# Patient Record
Sex: Female | Born: 1935
Health system: Southern US, Community
[De-identification: ages and names within clinical notes are randomized; demographics above are authoritative.]

## PROBLEM LIST (undated history)

## (undated) DIAGNOSIS — L570 Actinic keratosis: Secondary | ICD-10-CM

## (undated) DIAGNOSIS — M810 Age-related osteoporosis without current pathological fracture: Secondary | ICD-10-CM

## (undated) DIAGNOSIS — K5790 Diverticulosis of intestine, part unspecified, without perforation or abscess without bleeding: Secondary | ICD-10-CM

## (undated) DIAGNOSIS — K529 Noninfective gastroenteritis and colitis, unspecified: Secondary | ICD-10-CM

## (undated) DIAGNOSIS — K8020 Calculus of gallbladder without cholecystitis without obstruction: Secondary | ICD-10-CM

## (undated) DIAGNOSIS — K635 Polyp of colon: Secondary | ICD-10-CM

## (undated) DIAGNOSIS — M6740 Ganglion, unspecified site: Secondary | ICD-10-CM

## (undated) DIAGNOSIS — I10 Essential (primary) hypertension: Secondary | ICD-10-CM

## (undated) DIAGNOSIS — K219 Gastro-esophageal reflux disease without esophagitis: Secondary | ICD-10-CM

## (undated) HISTORY — PX: COLONOSCOPY: SHX174

## (undated) HISTORY — DX: Polyp of colon: K63.5

## (undated) HISTORY — DX: Noninfective gastroenteritis and colitis, unspecified: K52.9

## (undated) HISTORY — PX: RECTAL SURGERY: SHX760

## (undated) HISTORY — PX: APPENDECTOMY: SHX54

## (undated) HISTORY — DX: Gastro-esophageal reflux disease without esophagitis: K21.9

## (undated) HISTORY — DX: Actinic keratosis: L57.0

## (undated) HISTORY — PX: CHOLECYSTECTOMY: SHX55

## (undated) HISTORY — DX: Diverticulosis of intestine, part unspecified, without perforation or abscess without bleeding: K57.90

## (undated) HISTORY — PX: TONSILLECTOMY: SUR1361

## (undated) HISTORY — PX: COLON SURGERY: SHX602

## (undated) HISTORY — DX: Calculus of gallbladder without cholecystitis without obstruction: K80.20

## (undated) SURGERY — Surgical Case
Anesthesia: *Unknown

---

## 2012-06-03 ENCOUNTER — Encounter (INDEPENDENT_AMBULATORY_CARE_PROVIDER_SITE_OTHER): Payer: Self-pay | Admitting: *Deleted

## 2012-06-24 ENCOUNTER — Ambulatory Visit (INDEPENDENT_AMBULATORY_CARE_PROVIDER_SITE_OTHER): Payer: Self-pay | Admitting: Internal Medicine

## 2013-04-24 DIAGNOSIS — R55 Syncope and collapse: Secondary | ICD-10-CM

## 2013-04-24 DIAGNOSIS — R42 Dizziness and giddiness: Secondary | ICD-10-CM

## 2015-02-28 ENCOUNTER — Encounter (INDEPENDENT_AMBULATORY_CARE_PROVIDER_SITE_OTHER): Payer: Self-pay | Admitting: *Deleted

## 2015-02-28 ENCOUNTER — Encounter (INDEPENDENT_AMBULATORY_CARE_PROVIDER_SITE_OTHER): Payer: Self-pay

## 2015-03-07 ENCOUNTER — Other Ambulatory Visit (INDEPENDENT_AMBULATORY_CARE_PROVIDER_SITE_OTHER): Payer: Self-pay | Admitting: *Deleted

## 2015-03-07 DIAGNOSIS — Z86010 Personal history of colonic polyps: Secondary | ICD-10-CM

## 2015-03-07 DIAGNOSIS — Z800 Family history of malignant neoplasm of digestive organs: Secondary | ICD-10-CM

## 2015-04-11 ENCOUNTER — Telehealth (INDEPENDENT_AMBULATORY_CARE_PROVIDER_SITE_OTHER): Payer: Self-pay | Admitting: *Deleted

## 2015-04-11 MED ORDER — PEG 3350-KCL-NA BICARB-NACL 420 G PO SOLR: 4000.0000 mL | Freq: Once | ORAL | Status: DC

## 2015-04-11 NOTE — Telephone Encounter (Signed)
Patient needs trilyte 

## 2015-05-01 ENCOUNTER — Telehealth (INDEPENDENT_AMBULATORY_CARE_PROVIDER_SITE_OTHER): Payer: Self-pay | Admitting: *Deleted

## 2015-05-01 NOTE — Telephone Encounter (Signed)
Referring MD/PCP: vyas   Procedure: tcs  Reason/Indication:  Hx polyps, fam hx colon ca  Has patient had this procedure before?  Yes, 2011 -- scanned  If so, when, by whom and where?    Is there a family history of colon cancer?  Yes, brother  Who?  What age when diagnosed?    Is patient diabetic?   no      Does patient have prosthetic heart valve?  no  Do you have a pacemaker?  no  Has patient ever had endocarditis? no  Has patient had joint replacement within last 12 months?  no  Does patient tend to be constipated or take laxatives? no  Is patient on Coumadin, Plavix and/or Aspirin? no  Medications: spironolactone 25 mg daily, bystolic 10 mg daily, lisinopril 20 mg daily, evista 60 mg daily, potassium daily, cod liver oil daily  Allergies: sulfur  Medication Adjustment:   Procedure date & time: 05/18/15 at 1030

## 2015-05-03 NOTE — Telephone Encounter (Signed)
agree

## 2015-05-18 ENCOUNTER — Ambulatory Visit (HOSPITAL_COMMUNITY)
Admission: RE | Admit: 2015-05-18 | Discharge: 2015-05-18 | Disposition: A | Discharge status: Home / Self Care | Payer: Medicare Other | Source: Ambulatory Visit | Attending: Internal Medicine | Admitting: Internal Medicine

## 2015-05-18 ENCOUNTER — Encounter (HOSPITAL_COMMUNITY): Payer: Self-pay | Admitting: *Deleted

## 2015-05-18 ENCOUNTER — Encounter (HOSPITAL_COMMUNITY)
Admission: RE | Disposition: A | Discharge status: Home / Self Care | Payer: Self-pay | Source: Ambulatory Visit | Attending: Internal Medicine

## 2015-05-18 DIAGNOSIS — Z9049 Acquired absence of other specified parts of digestive tract: Secondary | ICD-10-CM | POA: Diagnosis not present

## 2015-05-18 DIAGNOSIS — I10 Essential (primary) hypertension: Secondary | ICD-10-CM | POA: Diagnosis not present

## 2015-05-18 DIAGNOSIS — D128 Benign neoplasm of rectum: Secondary | ICD-10-CM | POA: Insufficient documentation

## 2015-05-18 DIAGNOSIS — Z79899 Other long term (current) drug therapy: Secondary | ICD-10-CM | POA: Diagnosis not present

## 2015-05-18 DIAGNOSIS — Z1211 Encounter for screening for malignant neoplasm of colon: Secondary | ICD-10-CM | POA: Diagnosis present

## 2015-05-18 DIAGNOSIS — K648 Other hemorrhoids: Secondary | ICD-10-CM | POA: Diagnosis not present

## 2015-05-18 DIAGNOSIS — Z86010 Personal history of colonic polyps: Secondary | ICD-10-CM

## 2015-05-18 DIAGNOSIS — K649 Unspecified hemorrhoids: Secondary | ICD-10-CM | POA: Diagnosis not present

## 2015-05-18 DIAGNOSIS — K5730 Diverticulosis of large intestine without perforation or abscess without bleeding: Secondary | ICD-10-CM | POA: Diagnosis not present

## 2015-05-18 DIAGNOSIS — Z800 Family history of malignant neoplasm of digestive organs: Secondary | ICD-10-CM

## 2015-05-18 HISTORY — DX: Essential (primary) hypertension: I10

## 2015-05-18 HISTORY — PX: COLONOSCOPY: SHX5424

## 2015-05-18 SURGERY — COLONOSCOPY
Anesthesia: Moderate Sedation

## 2015-05-18 MED ORDER — MIDAZOLAM HCL 5 MG/5ML IJ SOLN
INTRAMUSCULAR | Status: AC
  Filled 2015-05-18: qty 10

## 2015-05-18 MED ORDER — STERILE WATER FOR IRRIGATION IR SOLN
Status: DC | PRN
  Administered 2015-05-18: 11:00:00

## 2015-05-18 MED ORDER — SODIUM CHLORIDE 0.9 % IV SOLN
INTRAVENOUS | Status: DC
  Administered 2015-05-18: 10:00:00 via INTRAVENOUS

## 2015-05-18 MED ORDER — MEPERIDINE HCL 50 MG/ML IJ SOLN
INTRAMUSCULAR | Status: AC
  Filled 2015-05-18: qty 1

## 2015-05-18 MED ORDER — MIDAZOLAM HCL 5 MG/5ML IJ SOLN
INTRAMUSCULAR | Status: DC | PRN
  Administered 2015-05-18 (×2): 1 mg via INTRAVENOUS
  Administered 2015-05-18: 2 mg via INTRAVENOUS

## 2015-05-18 MED ORDER — MEPERIDINE HCL 50 MG/ML IJ SOLN
INTRAMUSCULAR | Status: DC | PRN
  Administered 2015-05-18 (×2): 25 mg via INTRAVENOUS

## 2015-05-18 NOTE — Discharge Instructions (Signed)
Resume usual medications and high fiber diet. Imodium OTC 2 mg by mouth daily as needed. No driving for 24 hours. Physician will call with biopsy results. Next colonoscopy in 5 years.      Colonoscopy, Care After These instructions give you information on caring for yourself after your procedure. Your doctor may also give you more specific instructions. Call your doctor if you have any problems or questions after your procedure. HOME CARE  Do not drive for 24 hours.  Do not sign important papers or use machinery for 24 hours.  You may shower.  You may go back to your usual activities, but go slower for the first 24 hours.  Take rest breaks often during the first 24 hours.  Walk around or use warm packs on your belly (abdomen) if you have belly cramping or gas.  Drink enough fluids to keep your pee (urine) clear or pale yellow.  Resume your normal diet. Avoid heavy or fried foods.  Avoid drinking alcohol for 24 hours or as told by your doctor.  Only take medicines as told by your doctor. If a tissue sample (biopsy) was taken during the procedure:   Do not take aspirin or blood thinners for 7 days, or as told by your doctor.  Do not drink alcohol for 7 days, or as told by your doctor.  Eat soft foods for the first 24 hours. GET HELP IF: You still have a small amount of blood in your poop (stool) 2-3 days after the procedure. GET HELP RIGHT AWAY IF:  You have more than a small amount of blood in your poop.  You see clumps of tissue (blood clots) in your poop.  Your belly is puffy (swollen).  You feel sick to your stomach (nauseous) or throw up (vomit).  You have a fever.  You have belly pain that gets worse and medicine does not help. MAKE SURE YOU:  Understand these instructions.  Will watch your condition.  Will get help right away if you are not doing well or get worse. Document Released: 11/02/2010 Document Revised: 10/05/2013 Document Reviewed:  06/07/2013 [facility] Patient Information 2015 [place], [place]. This information is not intended to replace advice given to you by your health care provider. Make sure you discuss any questions you have with your health care provider.     High-Fiber Diet Fiber is found in fruits, vegetables, and grains. A high-fiber diet encourages the addition of more whole grains, legumes, fruits, and vegetables in your diet. The recommended amount of fiber for adult males is 38 g per day. For adult females, it is 25 g per day. Pregnant and lactating women should get 28 g of fiber per day. If you have a digestive or bowel problem, ask your caregiver for advice before adding high-fiber foods to your diet. Eat a variety of high-fiber foods instead of only a select few type of foods.  PURPOSE  To increase stool bulk.  To make bowel movements more regular to prevent constipation.  To lower cholesterol.  To prevent overeating. WHEN IS THIS DIET USED?  It may be used if you have constipation and hemorrhoids.  It may be used if you have uncomplicated diverticulosis (intestine condition) and irritable bowel syndrome.  It may be used if you need help with weight management.  It may be used if you want to add it to your diet as a protective measure against atherosclerosis, diabetes, and cancer. SOURCES OF FIBER  Whole-grain breads and cereals.  Fruits, such  as apples, oranges, bananas, berries, prunes, and pears.  Vegetables, such as green peas, carrots, sweet potatoes, beets, broccoli, cabbage, spinach, and artichokes.  Legumes, such split peas, soy, lentils.  Almonds. FIBER CONTENT IN FOODS Starches and Grains / Dietary Fiber (g)  Cheerios, 1 cup / 3 g  Corn Flakes cereal, 1 cup / 0.7 g  Rice crispy treat cereal, 1 cup / 0.3 g  Instant oatmeal (cooked),  cup / 2 g  Frosted wheat cereal, 1 cup / 5.1 g  Brown, long-grain rice (cooked), 1 cup / 3.5 g  White, long-grain rice (cooked), 1 cup  / 0.6 g  Enriched macaroni (cooked), 1 cup / 2.5 g Legumes / Dietary Fiber (g)  Baked beans (canned, plain, or vegetarian),  cup / 5.2 g  Kidney beans (canned),  cup / 6.8 g  Pinto beans (cooked),  cup / 5.5 g Breads and Crackers / Dietary Fiber (g)  Plain or honey graham crackers, 2 squares / 0.7 g  Saltine crackers, 3 squares / 0.3 g  Plain, salted pretzels, 10 pieces / 1.8 g  Whole-wheat bread, 1 slice / 1.9 g  White bread, 1 slice / 0.7 g  Raisin bread, 1 slice / 1.2 g  Plain bagel, 3 oz / 2 g  Flour tortilla, 1 oz / 0.9 g  Corn tortilla, 1 small / 1.5 g  Hamburger or hotdog bun, 1 small / 0.9 g Fruits / Dietary Fiber (g)  Apple with skin, 1 medium / 4.4 g  Sweetened applesauce,  cup / 1.5 g  Banana,  medium / 1.5 g  Grapes, 10 grapes / 0.4 g  Orange, 1 small / 2.3 g  Raisin, 1.5 oz / 1.6 g  Melon, 1 cup / 1.4 g Vegetables / Dietary Fiber (g)  Green beans (canned),  cup / 1.3 g  Carrots (cooked),  cup / 2.3 g  Broccoli (cooked),  cup / 2.8 g  Peas (cooked),  cup / 4.4 g  Mashed potatoes,  cup / 1.6 g  Lettuce, 1 cup / 0.5 g  Corn (canned),  cup / 1.6 g  Tomato,  cup / 1.1 g Document Released: 09/30/2005 Document Revised: 03/31/2012 Document Reviewed: 01/02/2012 [employer] Patient Information 2015 [place], [place]. This information is not intended to replace advice given to you by your health care provider. Make sure you discuss any questions you have with your health care provider.    Colon Polyps Polyps are lumps of extra tissue growing inside the body. Polyps can grow in the large intestine (colon). Most colon polyps are noncancerous (benign). However, some colon polyps can become cancerous over time. Polyps that are larger than a pea may be harmful. To be safe, caregivers remove and test all polyps. CAUSES  Polyps form when mutations in the genes cause your cells to grow and divide even though no more tissue is needed. RISK  FACTORS There are a number of risk factors that can increase your chances of getting colon polyps. They include:  Being older than 50 years.  Family history of colon polyps or colon cancer.  Long-term colon diseases, such as colitis or Crohn disease.  Being overweight.  Smoking.  Being inactive.  Drinking too much alcohol. SYMPTOMS  Most small polyps do not cause symptoms. If symptoms are present, they may include:  Blood in the stool. The stool may look dark red or black.  Constipation or diarrhea that lasts longer than 1 week. DIAGNOSIS People often do not know they have  polyps until their caregiver finds them during a regular checkup. Your caregiver can use 4 tests to check for polyps:  Digital rectal exam. The caregiver wears gloves and feels inside the rectum. This test would find polyps only in the rectum.  Barium enema. The caregiver puts a liquid called barium into your rectum before taking X-rays of your colon. Barium makes your colon look white. Polyps are dark, so they are easy to see in the X-ray pictures.  Sigmoidoscopy. A thin, flexible tube (sigmoidoscope) is placed into your rectum. The sigmoidoscope has a light and tiny camera in it. The caregiver uses the sigmoidoscope to look at the last third of your colon.  Colonoscopy. This test is like sigmoidoscopy, but the caregiver looks at the entire colon. This is the most common method for finding and removing polyps. TREATMENT  Any polyps will be removed during a sigmoidoscopy or colonoscopy. The polyps are then tested for cancer. PREVENTION  To help lower your risk of getting more colon polyps:  Eat plenty of fruits and vegetables. Avoid eating fatty foods.  Do not smoke.  Avoid drinking alcohol.  Exercise every day.  Lose weight if recommended by your caregiver.  Eat plenty of calcium and folate. Foods that are rich in calcium include milk, cheese, and broccoli. Foods that are rich in folate include  chickpeas, kidney beans, and spinach. HOME CARE INSTRUCTIONS Keep all follow-up appointments as directed by your caregiver. You may need periodic exams to check for polyps. SEEK MEDICAL CARE IF: You notice bleeding during a bowel movement. Document Released: 06/26/2004 Document Revised: 12/23/2011 Document Reviewed: 12/10/2011 [facility] Patient Information 2015 [place], [place]. This information is not intended to replace advice given to you by your health care provider. Make sure you discuss any questions you have with your health care provider.     Diverticulosis Diverticulosis is the condition that develops when small pouches (diverticula) form in the wall of your colon. Your colon, or large intestine, is where water is absorbed and stool is formed. The pouches form when the inside layer of your colon pushes through weak spots in the outer layers of your colon. CAUSES  No one knows exactly what causes diverticulosis. RISK FACTORS  Being older than 15. Your risk for this condition increases with age. Diverticulosis is rare in people younger than 40 years. By age 21, almost everyone has it.  Eating a low-fiber diet.  Being frequently constipated.  Being overweight.  Not getting enough exercise.  Smoking.  Taking over-the-counter pain medicines, like aspirin and ibuprofen. SYMPTOMS  Most people with diverticulosis do not have symptoms. DIAGNOSIS  Because diverticulosis often has no symptoms, health care providers often discover the condition during an exam for other colon problems. In many cases, a health care provider will diagnose diverticulosis while using a flexible scope to examine the colon (colonoscopy). TREATMENT  If you have never developed an infection related to diverticulosis, you may not need treatment. If you have had an infection before, treatment may include:  Eating more fruits, vegetables, and grains.  Taking a fiber supplement.  Taking a live bacteria  supplement (probiotic).  Taking medicine to relax your colon. HOME CARE INSTRUCTIONS   Drink at least 6-8 glasses of water each day to prevent constipation.  Try not to strain when you have a bowel movement.  Keep all follow-up appointments. If you have had an infection before:  Increase the fiber in your diet as directed by your health care provider or dietitian.  Take  a dietary fiber supplement if your health care provider approves.  Only take medicines as directed by your health care provider. SEEK MEDICAL CARE IF:   You have abdominal pain.  You have bloating.  You have cramps.  You have not gone to the bathroom in 3 days. SEEK IMMEDIATE MEDICAL CARE IF:   Your pain gets worse.  Yourbloating becomes very bad.  You have a fever or chills, and your symptoms suddenly get worse.  You begin vomiting.  You have bowel movements that are bloody or black. MAKE SURE YOU:  Understand these instructions.  Will watch your condition.  Will get help right away if you are not doing well or get worse. Document Released: 06/27/2004 Document Revised: 10/05/2013 Document Reviewed: 08/25/2013 [facility] Patient Information 2015 [place], [place]. This information is not intended to replace advice given to you by your health care provider. Make sure you discuss any questions you have with your health care provider.   Hemorrhoids Hemorrhoids are swollen veins around the rectum or anus. There are two types of hemorrhoids:   Internal hemorrhoids. These occur in the veins just inside the rectum. They may poke through to the outside and become irritated and painful.  External hemorrhoids. These occur in the veins outside the anus and can be felt as a painful swelling or hard lump near the anus. CAUSES  Pregnancy.   Obesity.   Constipation or diarrhea.   Straining to have a bowel movement.   Sitting for long periods on the toilet.  Heavy lifting or other activity that  caused you to strain.  Anal intercourse. SYMPTOMS   Pain.   Anal itching or irritation.   Rectal bleeding.   Fecal leakage.   Anal swelling.   One or more lumps around the anus.  DIAGNOSIS  Your caregiver may be able to diagnose hemorrhoids by visual examination. Other examinations or tests that may be performed include:   Examination of the rectal area with a gloved hand (digital rectal exam).   Examination of anal canal using a small tube (scope).   A blood test if you have lost a significant amount of blood.  A test to look inside the colon (sigmoidoscopy or colonoscopy). TREATMENT Most hemorrhoids can be treated at home. However, if symptoms do not seem to be getting better or if you have a lot of rectal bleeding, your caregiver may perform a procedure to help make the hemorrhoids get smaller or remove them completely. Possible treatments include:   Placing a rubber band at the base of the hemorrhoid to cut off the circulation (rubber band ligation).   Injecting a chemical to shrink the hemorrhoid (sclerotherapy).   Using a tool to burn the hemorrhoid (infrared light therapy).   Surgically removing the hemorrhoid (hemorrhoidectomy).   Stapling the hemorrhoid to block blood flow to the tissue (hemorrhoid stapling).  HOME CARE INSTRUCTIONS   Eat foods with fiber, such as whole grains, beans, nuts, fruits, and vegetables. Ask your doctor about taking products with added fiber in them (fibersupplements).  Increase fluid intake. Drink enough water and fluids to keep your urine clear or pale yellow.   Exercise regularly.   Go to the bathroom when you have the urge to have a bowel movement. Do not wait.   Avoid straining to have bowel movements.   Keep the anal area dry and clean. Use wet toilet paper or moist towelettes after a bowel movement.   Medicated creams and suppositories may be used or applied as  directed.   Only take over-the-counter  or prescription medicines as directed by your caregiver.   Take warm sitz baths for 15-20 minutes, 3-4 times a day to ease pain and discomfort.   Place ice packs on the hemorrhoids if they are tender and swollen. Using ice packs between sitz baths may be helpful.   Put ice in a plastic bag.   Place a towel between your skin and the bag.   Leave the ice on for 15-20 minutes, 3-4 times a day.   Do not use a donut-shaped pillow or sit on the toilet for long periods. This increases blood pooling and pain.  SEEK MEDICAL CARE IF:  You have increasing pain and swelling that is not controlled by treatment or medicine.  You have uncontrolled bleeding.  You have difficulty or you are unable to have a bowel movement.  You have pain or inflammation outside the area of the hemorrhoids. MAKE SURE YOU:  Understand these instructions.  Will watch your condition.  Will get help right away if you are not doing well or get worse. Document Released: 09/27/2000 Document Revised: 09/16/2012 Document Reviewed: 08/04/2012 [facility] Patient Information 2015 [place], [place]. This information is not intended to replace advice given to you by your health care provider. Make sure you discuss any questions you have with your health care provider.

## 2015-05-18 NOTE — Op Note (Signed)
COLONOSCOPY PROCEDURE REPORT  PATIENT:  Morgan Sandoval  MR#:  [id number] Birthdate:  1935-02-22, 80 y.o., female Endoscopist:  Dr. Rogene Houston, MD Referred By:  Dr. [name], MD Procedure Date: 05/18/2015  Procedure:   Colonoscopy  Indications: Patient is 80 year old Caucasian female was history of colonic polyps and family history of CRC in brother. She had right hemicolectomy for large cecal tubulovillous adenoma in 2000 and she has failed resection of large rectal polyp in 2002. She had 2 small rectal polyps removed 5 years ago and these were sessile serrated polyps. She has intermittent diarrhea since her right hemicolectomy.  Informed Consent:  The procedure and risks were reviewed with the patient and informed consent was obtained.  Medications:  Demerol 50 mg IV Versed  4 mg IV  Description of procedure:  After a digital rectal exam was performed, that colonoscope was advanced from the anus through the rectum and colon to the area of hepatic flexure ileocolonic anastomosis was identified.  These structures were well-seen and photographed for the record.  Scope was slowly and cautiously withdrawn. The mucosal surfaces were carefully surveyed utilizing scope tip to flexion to facilitate fold flattening as needed. The scope was pulled down into the rectum where a thorough exam including retroflexion was performed.  Findings:   Prep satisfactory. Normal mucosa of distal small bowel. Wide-open ileocolonic anastomosis. Multiple diverticula at sigmoid colon. Small rectal polyp ablated via cold biopsy. Distal rectal scar from prior polypectomy. Small hemorrhoids below the dentate line.   Therapeutic/Diagnostic Maneuvers Performed:  See above  Complications:  None  Colon Withdrawal Time:  9 minutes  Impression:  Examination performed to ileocolonic anastomosis. Multiple diverticula at sigmoid colon. Small rectal polyp ablated via cold biopsy. Distal rectal scar from  previous polypectomy. Small external hemorrhoids   Recommendations:  Standard instructions given. High fiber diet. Imodium OTC 2 mg by mouth daily when necessary.` I will contact patient with biopsy results and further recommendations.  [name] U  05/18/2015 11:00 AM  CC: Dr. [name]., MD & Dr. Rayne Du ref. provider found

## 2015-05-18 NOTE — H&P (Signed)
Morgan Sandoval is an 80 y.o. female.   Chief Complaint: Patient is here for colonoscopy. HPI: Patient is 80 year old Caucasian female was history of colonic polyps and is here for surveillance colonoscopy. She denies rectal bleeding. She says she's had diarrhea since she had right hemicolectomy for large cecal tubulovillous adenoma in 2000. She believes psyllium has helped. She has anywhere from 1-4 stools per day. She denies weight loss. She underwent transanal resection of large adenoma at [facility] in 2002. Last colonoscopy was about 5 years ago with removal of 2 small rectal polyps and these is sessile serrated polyps. Family history significant for CRC in a brother who was diagnosed with rectal carcinoma 2 years ago treated with chemoradiation followed by surgery and is doing fine.  Past Medical History  Diagnosis Date  . Hypertension     Past Surgical History  Procedure Laterality Date  . Appendectomy    . Tonsillectomy    . Colon surgery      Removed large polyps  . Colonoscopy    . Cholecystectomy      Family History  Problem Relation Age of Onset  . Cancer - Colon Brother    Social History:  reports that she has never smoked. She does not have any smokeless tobacco history on file. She reports that she drinks alcohol. She reports that she does not use illicit drugs.  Allergies:  Allergies  Allergen Reactions  . Sulfa Antibiotics Rash    Medications Prior to Admission  Medication Sig Dispense Refill  . lisinopril (PRINIVIL,ZESTRIL) 5 MG tablet Take 5 mg by mouth daily.    . Multiple Vitamin (MULTIVITAMIN WITH MINERALS) TABS tablet Take 1 tablet by mouth daily.    . nebivolol (BYSTOLIC) 10 MG tablet Take 10 mg by mouth daily.    . polyethylene glycol-electrolytes (NULYTELY/GOLYTELY) 420 G solution Take 4,000 mLs by mouth once. 4000 mL 0  . raloxifene (EVISTA) 60 MG tablet Take 60 mg by mouth daily.    Marland Kitchen spironolactone (ALDACTONE) 25 MG tablet Take 25 mg by mouth daily.       No results found for this or any previous visit (from the past 48 hour(s)). No results found.  ROS  Blood pressure 150/71, pulse 58, temperature 98 F (36.7 C), temperature source Oral, resp. rate 18, SpO2 98 %. Physical Exam  Constitutional: She appears well-developed and well-nourished.  HENT:  Mouth/Throat: Oropharynx is clear and moist.  Eyes: Conjunctivae are normal. No scleral icterus.  Neck: No thyromegaly present.  Cardiovascular: Normal rate, regular rhythm and normal heart sounds.   No murmur heard. Respiratory: Effort normal and breath sounds normal.  GI: Soft. She exhibits no distension and no mass. There is no tenderness.  Musculoskeletal: She exhibits no edema.  Lymphadenopathy:    She has no cervical adenopathy.  Neurological: She is alert.  Skin: Skin is warm and dry.     Assessment/Plan History of colonic polyps and family history of CRC in brother. Surveillance colonoscopy.  Morgan Sandoval U 05/18/2015, 10:27 AM

## 2015-05-22 ENCOUNTER — Encounter (HOSPITAL_COMMUNITY): Payer: Self-pay | Admitting: Internal Medicine

## 2015-05-23 ENCOUNTER — Encounter (INDEPENDENT_AMBULATORY_CARE_PROVIDER_SITE_OTHER): Payer: Self-pay | Admitting: *Deleted

## 2016-10-10 ENCOUNTER — Encounter (INDEPENDENT_AMBULATORY_CARE_PROVIDER_SITE_OTHER): Payer: Self-pay

## 2016-10-10 ENCOUNTER — Encounter (INDEPENDENT_AMBULATORY_CARE_PROVIDER_SITE_OTHER): Payer: Self-pay | Admitting: Internal Medicine

## 2016-10-29 ENCOUNTER — Encounter (INDEPENDENT_AMBULATORY_CARE_PROVIDER_SITE_OTHER): Payer: Self-pay | Admitting: Internal Medicine

## 2016-10-29 ENCOUNTER — Ambulatory Visit (INDEPENDENT_AMBULATORY_CARE_PROVIDER_SITE_OTHER): Payer: Medicare Other | Admitting: Internal Medicine

## 2016-10-29 VITALS — BP 114/68 | HR 64 | Temp 97.4°F | Resp 18 | Ht 65.0 in | Wt 127.4 lb

## 2016-10-29 DIAGNOSIS — Z86010 Personal history of colonic polyps: Secondary | ICD-10-CM

## 2016-10-29 DIAGNOSIS — R195 Other fecal abnormalities: Secondary | ICD-10-CM | POA: Diagnosis not present

## 2016-10-29 NOTE — Progress Notes (Signed)
Presenting complaint;  Heme positive stool.  History of present illness:  Patient is 82 year old Caucasian female who is referred through [facility] of Dr. Woody Seller because she was noted to have heme positive stool. She recalls that she was having diarrhea when this test was done. She denies melena or rectal bleeding. She also denies abdominal pain. She recalls since her right hand Legrand Como ectomy in 2000 she has had irregular bowel movements and prone to have diarrhea. She has anywhere from 0-3 bowel movements per day. She has very good appetite. She states she lost few pounds recently while she was an antibiotic for URI. She does not take OTC NSAIDs. She denies nose epistaxis or gum bleeding. She has occasional heartburn with certain foods and she denies dysphagia..   Last colonoscopy was n augus 206 revealing wide-open ileocolonic anastomosis multiple diverticula at sigmoid colon rectal*from previous polypectomy small hemorrhoids and rectal polyp which was not an adenoma     Current Medications: Outpatient Encounter Prescriptions as of 10/29/2016  Medication Sig  . Cyanocobalamin (VITAMIN B12 PO) Take by mouth 2 (two) times daily.  . diclofenac sodium (VOLTAREN) 1 % GEL Apply topically 2 (two) times daily.  Marland Kitchen lisinopril (PRINIVIL,ZESTRIL) 5 MG tablet Take 5 mg by mouth daily.  . nebivolol (BYSTOLIC) 10 MG tablet Take 10 mg by mouth daily.  . raloxifene (EVISTA) 60 MG tablet Take 60 mg by mouth daily.  Marland Kitchen spironolactone (ALDACTONE) 25 MG tablet Take 25 mg by mouth daily.  . [DISCONTINUED] Multiple Vitamin (MULTIVITAMIN WITH MINERALS) TABS tablet Take 1 tablet by mouth daily.   No facility-administered encounter medications on file as of 10/29/2016.    Past Medical History:  Diagnosis Date  . Hypertension       History of colonic polyps. She had right hemicolectomy for large tubulovillous adenoma in 2000. She had transanal resection of large rectal polyp in 2002.  Past Surgical History:   Procedure Laterality Date  . APPENDECTOMY    . CHOLECYSTECTOMY    . COLON SURGERY     Removed large polyps  . COLONOSCOPY    . COLONOSCOPY N/A 05/18/2015   Procedure: COLONOSCOPY;  Surgeon: Rogene Houston, MD;  [facility];  Service: Endoscopy;  Laterality: N/A;  1030  . TONSILLECTOMY        Objective: Blood pressure 114/68, pulse 64, temperature 97.4 F (36.3 C), temperature source Oral, resp. rate 18, height 5\' 5"  (1.651 m), weight 127 lb 6.4 oz (57.8 kg). Patient is alert and in no acute distress. Conjunctiva is pink. Sclera is nonicteric Oropharyngeal mucosa is normal. No neck masses or thyromegaly noted. Cardiac exam with regular rhythm normal S1 and S2. No murmur or gallop noted. Lungs are clear to auscultation. Abdomen is symmetrical soft and nontender without organomegaly or masses.  No LE edema or clubbing noted.  Labs/studies Results:   H&H was 13.9 and 43.1 on 10/04/2016.  Assessment:  #1. Heme positive stool. Patient's hemoglobin is normal. No history of melena or rectal bleeding. Denies history of colonic polyps and family history of colon carcinoma and she is up-to-date on surveillance colonoscopy. I do not feel that she needs to undergo another colonoscopy or EGD at this time. Will follow her hemoglobin and determine need for further testing.   Plan:  Hemoccults 1. CBC in 3 months.

## 2016-10-29 NOTE — Patient Instructions (Addendum)
Hemoccult 1. CBC in 4th week of March 2018. Office will call you. Call office if you have rectal bleeding or tarry stool.

## 2016-11-04 ENCOUNTER — Other Ambulatory Visit (INDEPENDENT_AMBULATORY_CARE_PROVIDER_SITE_OTHER): Payer: Self-pay | Admitting: *Deleted

## 2016-11-04 ENCOUNTER — Telehealth (INDEPENDENT_AMBULATORY_CARE_PROVIDER_SITE_OTHER): Payer: Self-pay | Admitting: *Deleted

## 2016-11-04 DIAGNOSIS — Z860100 Personal history of colon polyps, unspecified: Secondary | ICD-10-CM

## 2016-11-04 DIAGNOSIS — R195 Other fecal abnormalities: Secondary | ICD-10-CM

## 2016-11-04 NOTE — Telephone Encounter (Signed)
   Diagnosis:    Result(s)   Card 1: Negative:           Completed by:    HEMOCCULT SENSA DEVELOPER: [id number]   EXPIRATION DATE: 2020-05   HEMOCCULT SENSA CARD:  [id number]   EXPIRATION DATE: 03-20   CARD CONTROL RESULTS:  POSITIVE: Positive  NEGATIVE: Negative    ADDITIONAL COMMENTS: patient was called and made aware of her results.

## 2016-11-13 NOTE — Telephone Encounter (Signed)
Stool guaiac is negative. Patient to have CBC in 3 months from the time of office visit.

## 2016-11-21 ENCOUNTER — Other Ambulatory Visit (INDEPENDENT_AMBULATORY_CARE_PROVIDER_SITE_OTHER): Payer: Self-pay | Admitting: *Deleted

## 2016-11-21 DIAGNOSIS — K921 Melena: Secondary | ICD-10-CM

## 2016-11-21 NOTE — Telephone Encounter (Signed)
The patient has a order for lab work 01/27/2017. A letter will be sent as a reminder.

## 2016-12-09 ENCOUNTER — Other Ambulatory Visit (INDEPENDENT_AMBULATORY_CARE_PROVIDER_SITE_OTHER): Payer: Self-pay | Admitting: *Deleted

## 2016-12-09 ENCOUNTER — Encounter (INDEPENDENT_AMBULATORY_CARE_PROVIDER_SITE_OTHER): Payer: Self-pay

## 2016-12-09 ENCOUNTER — Encounter (INDEPENDENT_AMBULATORY_CARE_PROVIDER_SITE_OTHER): Payer: Self-pay | Admitting: *Deleted

## 2016-12-09 DIAGNOSIS — Z86010 Personal history of colonic polyps: Secondary | ICD-10-CM

## 2016-12-09 DIAGNOSIS — R195 Other fecal abnormalities: Secondary | ICD-10-CM

## 2017-01-06 ENCOUNTER — Other Ambulatory Visit (INDEPENDENT_AMBULATORY_CARE_PROVIDER_SITE_OTHER): Payer: Self-pay | Admitting: *Deleted

## 2017-01-06 ENCOUNTER — Encounter (INDEPENDENT_AMBULATORY_CARE_PROVIDER_SITE_OTHER): Payer: Self-pay | Admitting: *Deleted

## 2017-01-06 DIAGNOSIS — K921 Melena: Secondary | ICD-10-CM

## 2018-08-13 ENCOUNTER — Encounter: Payer: Self-pay | Admitting: Internal Medicine

## 2019-02-26 ENCOUNTER — Other Ambulatory Visit: Payer: Self-pay | Admitting: Orthopedic Surgery

## 2019-02-26 ENCOUNTER — Encounter (HOSPITAL_BASED_OUTPATIENT_CLINIC_OR_DEPARTMENT_OTHER): Payer: Self-pay | Admitting: *Deleted

## 2019-02-26 ENCOUNTER — Other Ambulatory Visit: Payer: Self-pay

## 2019-02-26 ENCOUNTER — Other Ambulatory Visit (HOSPITAL_COMMUNITY)
Admission: RE | Admit: 2019-02-26 | Discharge: 2019-02-26 | Disposition: A | Discharge status: Home / Self Care | Payer: Medicare Other | Source: Ambulatory Visit | Attending: Orthopedic Surgery | Admitting: Orthopedic Surgery

## 2019-02-26 DIAGNOSIS — M810 Age-related osteoporosis without current pathological fracture: Secondary | ICD-10-CM | POA: Diagnosis not present

## 2019-02-26 DIAGNOSIS — M67442 Ganglion, left hand: Secondary | ICD-10-CM | POA: Diagnosis not present

## 2019-02-26 DIAGNOSIS — Z1159 Encounter for screening for other viral diseases: Secondary | ICD-10-CM | POA: Diagnosis not present

## 2019-02-26 DIAGNOSIS — I10 Essential (primary) hypertension: Secondary | ICD-10-CM | POA: Diagnosis not present

## 2019-02-26 DIAGNOSIS — M152 Bouchard's nodes (with arthropathy): Secondary | ICD-10-CM | POA: Diagnosis not present

## 2019-02-26 DIAGNOSIS — M24042 Loose body in left finger joint(s): Secondary | ICD-10-CM | POA: Diagnosis not present

## 2019-02-26 DIAGNOSIS — Z79899 Other long term (current) drug therapy: Secondary | ICD-10-CM | POA: Diagnosis not present

## 2019-02-27 LAB — NOVEL CORONAVIRUS, NAA (HOSP ORDER, SEND-OUT TO REF LAB; TAT 18-24 HRS): SARS-CoV-2, NAA: NOT DETECTED

## 2019-03-01 ENCOUNTER — Other Ambulatory Visit: Payer: Self-pay

## 2019-03-01 ENCOUNTER — Encounter (HOSPITAL_BASED_OUTPATIENT_CLINIC_OR_DEPARTMENT_OTHER)
Admission: RE | Admit: 2019-03-01 | Discharge: 2019-03-01 | Disposition: A | Discharge status: Home / Self Care | Payer: Medicare Other | Source: Ambulatory Visit | Attending: Orthopedic Surgery | Admitting: Orthopedic Surgery

## 2019-03-01 DIAGNOSIS — M67442 Ganglion, left hand: Secondary | ICD-10-CM | POA: Diagnosis not present

## 2019-03-01 DIAGNOSIS — I10 Essential (primary) hypertension: Secondary | ICD-10-CM | POA: Diagnosis not present

## 2019-03-01 DIAGNOSIS — M152 Bouchard's nodes (with arthropathy): Secondary | ICD-10-CM | POA: Diagnosis not present

## 2019-03-01 DIAGNOSIS — M24042 Loose body in left finger joint(s): Secondary | ICD-10-CM | POA: Diagnosis not present

## 2019-03-01 LAB — BASIC METABOLIC PANEL
Anion gap: 12 (ref 5–15)
BUN: 13 mg/dL (ref 8–23)
CO2: 26 mmol/L (ref 22–32)
Calcium: 9.2 mg/dL (ref 8.9–10.3)
Chloride: 103 mmol/L (ref 98–111)
Creatinine, Ser: 1.06 mg/dL — ABNORMAL HIGH (ref 0.44–1.00)
GFR calc Af Amer: 57 mL/min — ABNORMAL LOW (ref >60)
GFR calc non Af Amer: 49 mL/min — ABNORMAL LOW (ref >60)
Glucose, Bld: 143 mg/dL — ABNORMAL HIGH (ref 70–99)
Potassium: 4.7 mmol/L (ref 3.5–5.1)
Sodium: 141 mmol/L (ref 135–145)

## 2019-03-01 NOTE — Progress Notes (Signed)
PAT labs drawn and EKG complete and reviewed by Dr. Marcell Barlow. Ensure given with instructions to finish at [place]. All questions and concerns were addressed.

## 2019-03-02 ENCOUNTER — Ambulatory Visit (HOSPITAL_BASED_OUTPATIENT_CLINIC_OR_DEPARTMENT_OTHER)
Admission: RE | Admit: 2019-03-02 | Discharge: 2019-03-02 | Disposition: A | Discharge status: Home / Self Care | Payer: Medicare Other | Attending: Orthopedic Surgery | Admitting: Orthopedic Surgery

## 2019-03-02 ENCOUNTER — Encounter (HOSPITAL_BASED_OUTPATIENT_CLINIC_OR_DEPARTMENT_OTHER): Payer: Self-pay | Admitting: *Deleted

## 2019-03-02 ENCOUNTER — Ambulatory Visit (HOSPITAL_BASED_OUTPATIENT_CLINIC_OR_DEPARTMENT_OTHER): Payer: Medicare Other | Admitting: Anesthesiology

## 2019-03-02 ENCOUNTER — Encounter (HOSPITAL_BASED_OUTPATIENT_CLINIC_OR_DEPARTMENT_OTHER)
Admission: RE | Disposition: A | Discharge status: Home / Self Care | Payer: Self-pay | Source: Home / Self Care | Attending: Orthopedic Surgery

## 2019-03-02 DIAGNOSIS — M24042 Loose body in left finger joint(s): Secondary | ICD-10-CM | POA: Insufficient documentation

## 2019-03-02 DIAGNOSIS — Z79899 Other long term (current) drug therapy: Secondary | ICD-10-CM | POA: Insufficient documentation

## 2019-03-02 DIAGNOSIS — Z1159 Encounter for screening for other viral diseases: Secondary | ICD-10-CM | POA: Insufficient documentation

## 2019-03-02 DIAGNOSIS — M67442 Ganglion, left hand: Secondary | ICD-10-CM | POA: Insufficient documentation

## 2019-03-02 DIAGNOSIS — I10 Essential (primary) hypertension: Secondary | ICD-10-CM | POA: Insufficient documentation

## 2019-03-02 DIAGNOSIS — M152 Bouchard's nodes (with arthropathy): Secondary | ICD-10-CM | POA: Insufficient documentation

## 2019-03-02 DIAGNOSIS — M810 Age-related osteoporosis without current pathological fracture: Secondary | ICD-10-CM | POA: Insufficient documentation

## 2019-03-02 HISTORY — DX: Ganglion, unspecified site: M67.40

## 2019-03-02 HISTORY — DX: Age-related osteoporosis without current pathological fracture: M81.0

## 2019-03-02 HISTORY — PX: MASS EXCISION: SHX2000

## 2019-03-02 SURGERY — EXCISION MASS
Anesthesia: Monitor Anesthesia Care | Site: Finger | Laterality: Left

## 2019-03-02 MED ORDER — LIDOCAINE HCL (CARDIAC) PF 100 MG/5ML IV SOSY
PREFILLED_SYRINGE | INTRAVENOUS | Status: DC | PRN
  Administered 2019-03-02: 50 mg via INTRAVENOUS

## 2019-03-02 MED ORDER — LIDOCAINE HCL (PF) 1 % IJ SOLN
INTRAMUSCULAR | Status: AC
  Filled 2019-03-02: qty 30

## 2019-03-02 MED ORDER — ACETAMINOPHEN 160 MG/5ML PO SOLN: 325.0000 mg | ORAL | Status: DC | PRN

## 2019-03-02 MED ORDER — PROPOFOL 10 MG/ML IV BOLUS
INTRAVENOUS | Status: DC | PRN
  Administered 2019-03-02 (×5): 10 mg via INTRAVENOUS

## 2019-03-02 MED ORDER — ONDANSETRON HCL 4 MG/2ML IJ SOLN
INTRAMUSCULAR | Status: DC | PRN
  Administered 2019-03-02: 4 mg via INTRAVENOUS

## 2019-03-02 MED ORDER — FENTANYL CITRATE (PF) 100 MCG/2ML IJ SOLN
INTRAMUSCULAR | Status: AC
  Filled 2019-03-02: qty 2

## 2019-03-02 MED ORDER — SCOPOLAMINE 1 MG/3DAYS TD PT72: 1.0000 | MEDICATED_PATCH | Freq: Once | TRANSDERMAL | Status: DC | PRN

## 2019-03-02 MED ORDER — OXYCODONE HCL 5 MG PO TABS: 5.0000 mg | ORAL_TABLET | Freq: Once | ORAL | Status: DC | PRN

## 2019-03-02 MED ORDER — ONDANSETRON HCL 4 MG/2ML IJ SOLN: 4.0000 mg | Freq: Once | INTRAMUSCULAR | Status: DC | PRN

## 2019-03-02 MED ORDER — TRAMADOL HCL 50 MG PO TABS: 50.0000 mg | ORAL_TABLET | Freq: Four times a day (QID) | ORAL | 0 refills | Status: DC | PRN

## 2019-03-02 MED ORDER — MIDAZOLAM HCL 2 MG/2ML IJ SOLN: 1.0000 mg | INTRAMUSCULAR | Status: DC | PRN

## 2019-03-02 MED ORDER — FENTANYL CITRATE (PF) 100 MCG/2ML IJ SOLN: 25.0000 ug | INTRAMUSCULAR | Status: DC | PRN

## 2019-03-02 MED ORDER — CEFAZOLIN SODIUM-DEXTROSE 2-4 GM/100ML-% IV SOLN
INTRAVENOUS | Status: AC
  Filled 2019-03-02: qty 100

## 2019-03-02 MED ORDER — FENTANYL CITRATE (PF) 100 MCG/2ML IJ SOLN
50.0000 ug | INTRAMUSCULAR | Status: DC | PRN
  Administered 2019-03-02: 11:00:00 50 ug via INTRAVENOUS

## 2019-03-02 MED ORDER — OXYCODONE HCL 5 MG/5ML PO SOLN: 5.0000 mg | Freq: Once | ORAL | Status: DC | PRN

## 2019-03-02 MED ORDER — ONDANSETRON HCL 4 MG/2ML IJ SOLN
INTRAMUSCULAR | Status: AC
  Filled 2019-03-02: qty 2

## 2019-03-02 MED ORDER — PROPOFOL 500 MG/50ML IV EMUL
INTRAVENOUS | Status: DC | PRN
  Administered 2019-03-02: 75 ug/kg/min via INTRAVENOUS

## 2019-03-02 MED ORDER — LACTATED RINGERS IV SOLN
INTRAVENOUS | Status: DC
  Administered 2019-03-02: 10:00:00 via INTRAVENOUS

## 2019-03-02 MED ORDER — CEFAZOLIN SODIUM-DEXTROSE 2-4 GM/100ML-% IV SOLN
2.0000 g | INTRAVENOUS | Status: AC
  Administered 2019-03-02: 2 g via INTRAVENOUS

## 2019-03-02 MED ORDER — CHLORHEXIDINE GLUCONATE 4 % EX LIQD: 60.0000 mL | Freq: Once | CUTANEOUS | Status: DC

## 2019-03-02 MED ORDER — MEPERIDINE HCL 25 MG/ML IJ SOLN: 6.2500 mg | INTRAMUSCULAR | Status: DC | PRN

## 2019-03-02 MED ORDER — ACETAMINOPHEN 325 MG PO TABS: 325.0000 mg | ORAL_TABLET | ORAL | Status: DC | PRN

## 2019-03-02 MED ORDER — BUPIVACAINE HCL (PF) 0.25 % IJ SOLN
INTRAMUSCULAR | Status: DC | PRN
  Administered 2019-03-02: 4 mL

## 2019-03-02 MED ORDER — LIDOCAINE HCL (PF) 1 % IJ SOLN
INTRAMUSCULAR | Status: DC | PRN
  Administered 2019-03-02: 4 mL

## 2019-03-02 MED ORDER — PROPOFOL 500 MG/50ML IV EMUL
INTRAVENOUS | Status: AC
  Filled 2019-03-02: qty 50

## 2019-03-02 SURGICAL SUPPLY — 46 items
BANDAGE COBAN LF 1.5X5 NS (GAUZE/BANDAGES/DRESSINGS) IMPLANT
BLADE SURG 15 STRL LF DISP TIS (BLADE) ×1 IMPLANT
BLADE SURG 15 STRL SS (BLADE) ×2
BNDG COHESIVE 2X5 TAN STRL LF (GAUZE/BANDAGES/DRESSINGS) ×2 IMPLANT
BNDG COHESIVE 3X5 TAN STRL LF (GAUZE/BANDAGES/DRESSINGS) IMPLANT
BNDG ESMARK 4X9 LF (GAUZE/BANDAGES/DRESSINGS) IMPLANT
BNDG GAUZE ELAST 4 BULKY (GAUZE/BANDAGES/DRESSINGS) IMPLANT
CHLORAPREP W/TINT 26 (MISCELLANEOUS) ×3 IMPLANT
CORD BIPOLAR FORCEPS 12FT (ELECTRODE) ×3 IMPLANT
COVER BACK TABLE REUSABLE LG (DRAPES) ×3 IMPLANT
COVER MAYO STAND REUSABLE (DRAPES) ×3 IMPLANT
COVER WAND RF STERILE (DRAPES) IMPLANT
CUFF TOURN SGL QUICK 18X4 (TOURNIQUET CUFF) IMPLANT
DECANTER SPIKE VIAL GLASS SM (MISCELLANEOUS) IMPLANT
DRAIN PENROSE 1/2X12 LTX STRL (WOUND CARE) ×2 IMPLANT
DRAPE EXTREMITY T 121X128X90 (DISPOSABLE) ×3 IMPLANT
DRAPE SURG 17X23 STRL (DRAPES) ×3 IMPLANT
GAUZE SPONGE 4X4 12PLY STRL (GAUZE/BANDAGES/DRESSINGS) ×3 IMPLANT
GAUZE XEROFORM 1X8 LF (GAUZE/BANDAGES/DRESSINGS) ×3 IMPLANT
GLOVE BIOGEL PI IND STRL 6.5 (GLOVE) IMPLANT
GLOVE BIOGEL PI IND STRL 8.5 (GLOVE) ×1 IMPLANT
GLOVE BIOGEL PI INDICATOR 6.5 (GLOVE) ×2
GLOVE BIOGEL PI INDICATOR 8.5 (GLOVE) ×2
GLOVE ECLIPSE 6.5 STRL STRAW (GLOVE) ×2 IMPLANT
GLOVE SURG ORTHO 8.0 STRL STRW (GLOVE) ×3 IMPLANT
GOWN STRL REUS W/ TWL LRG LVL3 (GOWN DISPOSABLE) ×1 IMPLANT
GOWN STRL REUS W/TWL LRG LVL3 (GOWN DISPOSABLE) ×4
GOWN STRL REUS W/TWL XL LVL3 (GOWN DISPOSABLE) ×3 IMPLANT
NDL PRECISIONGLIDE 27X1.5 (NEEDLE) ×1 IMPLANT
NDL SAFETY ECLIPSE 18X1.5 (NEEDLE) IMPLANT
NEEDLE HYPO 18GX1.5 SHARP (NEEDLE)
NEEDLE PRECISIONGLIDE 27X1.5 (NEEDLE) ×3 IMPLANT
NS IRRIG 1000ML POUR BTL (IV SOLUTION) ×3 IMPLANT
PACK BASIN DAY SURGERY FS (CUSTOM PROCEDURE TRAY) ×3 IMPLANT
PAD CAST 3X4 CTTN HI CHSV (CAST SUPPLIES) IMPLANT
PADDING CAST COTTON 3X4 STRL (CAST SUPPLIES)
SPLINT FINGER 3.25 911903 (SOFTGOODS) ×2 IMPLANT
SPLINT PLASTER CAST XFAST 3X15 (CAST SUPPLIES) IMPLANT
SPLINT PLASTER XTRA FASTSET 3X (CAST SUPPLIES)
STOCKINETTE 4X48 STRL (DRAPES) ×3 IMPLANT
SUT ETHILON 4 0 PS 2 18 (SUTURE) ×3 IMPLANT
SUT VIC AB 4-0 P2 18 (SUTURE) IMPLANT
SYR BULB 3OZ (MISCELLANEOUS) ×3 IMPLANT
SYR CONTROL 10ML LL (SYRINGE) ×3 IMPLANT
TOWEL GREEN STERILE FF (TOWEL DISPOSABLE) ×3 IMPLANT
UNDERPAD 30X30 (UNDERPADS AND DIAPERS) ×3 IMPLANT

## 2019-03-02 NOTE — Addendum Note (Signed)
Addendum  created 03/02/19 1704 by Janeece Riggers, MD   Clinical Note Signed

## 2019-03-02 NOTE — Anesthesia Postprocedure Evaluation (Signed)
Anesthesia Post Note  Patient: Conley Dewalt  Procedure(s) Performed: EXCISION CYST, DEBRIDEMENT PROXIMAL INTERPHALANGEAL JOINT LEFT INDEX FINGER (Left Finger)     Patient location during evaluation: PACU Anesthesia Type: MAC Level of consciousness: awake and alert Pain management: pain level controlled Vital Signs Assessment: post-procedure vital signs reviewed and stable Respiratory status: spontaneous breathing, nonlabored ventilation, respiratory function stable and patient connected to nasal cannula oxygen Cardiovascular status: stable and blood pressure returned to baseline Postop Assessment: no apparent nausea or vomiting Anesthetic complications: no    Last Vitals:  Vitals:   03/02/19 1130 03/02/19 1139  BP: (!) 145/71 (!) 152/76  Pulse: 61 61  Resp: 19 16  Temp:  37 C  SpO2: 97% 94%    Last Pain:  Vitals:   03/02/19 1139  TempSrc: Oral  PainSc: 0-No pain                 Haywood Meinders     

## 2019-03-02 NOTE — Brief Op Note (Signed)
03/02/2019  11:15 AM  PATIENT:  Morgan Sandoval  84 y.o. female  PRE-OPERATIVE DIAGNOSIS:  mucoid cyst left index finger proximal interphalangeal joint  POST-OPERATIVE DIAGNOSIS:  mucoid cyst left index finger proximal interphalangeal joint  PROCEDURE:  Procedure(s): EXCISION CYST, DEBRIDEMENT PROXIMAL INTERPHALANGEAL JOINT LEFT INDEX FINGER (Left)  SURGEON:  Surgeon(s) and Role:    Daryll Brod, MD - Primary  PHYSICIAN ASSISTANT:   ASSISTANTS: none   ANESTHESIA:   local and IV sedation  EBL: 51ml BLOOD ADMINISTERED:none  DRAINS: none   LOCAL MEDICATIONS USED:  BUPIVICAINE  and XYLOCAINE   SPECIMEN:  Excision  DISPOSITION OF SPECIMEN:  PATHOLOGY  COUNTS:  YES  TOURNIQUET:  * No tourniquets in log *  DICTATION: .Dragon Dictation  PLAN OF CARE: Discharge to home after PACU  PATIENT DISPOSITION:  PACU - hemodynamically stable.

## 2019-03-02 NOTE — Transfer of Care (Signed)
Immediate Anesthesia Transfer of Care Note  Patient: Morgan Sandoval  Procedure(s) Performed: EXCISION CYST, DEBRIDEMENT PROXIMAL INTERPHALANGEAL JOINT LEFT INDEX FINGER (Left Finger)  Patient Location: PACU  Anesthesia Type:MAC  Level of Consciousness: awake, alert  and oriented  Airway & Oxygen Therapy: Patient Spontanous Breathing and Patient connected to nasal cannula oxygen  Post-op Assessment: Report given to RN and Post -op Vital signs reviewed and stable  Post vital signs: Reviewed and stable  Last Vitals:  Vitals Value Taken Time  BP    Temp    Pulse 60 03/02/2019 11:19 AM  Resp 16 03/02/2019 11:19 AM  SpO2 95 % 03/02/2019 11:19 AM  Vitals shown include unvalidated device data.  Last Pain:  Vitals:   03/02/19 0947  TempSrc: Oral  PainSc: 0-No pain         Complications: No apparent anesthesia complications

## 2019-03-02 NOTE — H&P (Signed)
Morgan Sandoval is an 84 y.o. female.   Chief Complaint: mass left index finger [name] is a 84 year old right-hand-dominant female comes in referred by Dr. Joni Fears for consultation regarding a mass over the PIP joint of her left index finger. She states this has been present for approximately 2 months. Recalls no history of injury. She states that it is slightly enlarged. Is not causing her any pain or discomfort. States nothing makes it better or worse and has not had any treatment for this. She has a history of arthritis no history of diabetes thyroid problems    Past Medical History:  Diagnosis Date  . Hypertension   . Mucoid cyst of joint    left index finger  . Osteoporosis     Past Surgical History:  Procedure Laterality Date  . APPENDECTOMY    . CHOLECYSTECTOMY    . COLON SURGERY     Removed large polyps  . COLONOSCOPY    . COLONOSCOPY N/A 05/18/2015   Procedure: COLONOSCOPY;  Surgeon: Rogene Houston, MD;  [facility];  Service: Endoscopy;  Laterality: N/A;  1030  . TONSILLECTOMY      Family History  Problem Relation Age of Onset  . Cancer - Colon Brother    Social History:  reports that she has never smoked. She has never used smokeless tobacco. She reports current alcohol use. She reports that she does not use drugs.  Allergies:  Allergies  Allergen Reactions  . Sulfa Antibiotics Rash    No medications prior to admission.    Results for orders placed or performed during the hospital encounter of 03/02/19 (from the past 48 hour(s))  Basic metabolic panel     Status: Abnormal   Collection Time: 03/01/19 12:46 PM  Result Value Ref Range   Sodium 141 135 - 145 mmol/L   Potassium 4.7 3.5 - 5.1 mmol/L   Chloride 103 98 - 111 mmol/L   CO2 26 22 - 32 mmol/L   Glucose, Bld 143 (H) 70 - 99 mg/dL   BUN 13 8 - 23 mg/dL   Creatinine, Ser 1.06 (H) 0.44 - 1.00 mg/dL   Calcium 9.2 8.9 - 10.3 mg/dL   GFR calc non Af Amer 49 (L) >60 mL/min   GFR calc Af Amer 57  (L) >60 mL/min   Anion gap 12 5 - 15    Comment: Performed at [facility] [address] [place], [place] [address]    No results found.   Pertinent items are noted in HPI.  Height 5\' 5"  (1.651 m), weight 57.6 kg.  General appearance: alert, cooperative and appears stated age Head: Normocephalic, without obvious abnormality Neck: no JVD Resp: clear to auscultation bilaterally Cardio: regular rate and rhythm, S1, S2 normal, no murmur, click, rub or gallop GI: soft, non-tender; bowel sounds normal; no masses,  no organomegaly Extremities: mass left index finger Pulses: 2+ and symmetric Skin: Skin color, texture, turgor normal. No rashes or lesions Neurologic: Grossly normal Incision/Wound: na  Assessment/Plan Assessment:   Primary osteoarthritis of both first carpometacarpal joints   Mucoid cyst, joint    Plan: We have discussed the etiology of the cyst with her. Would recommend surgical excision debridement of the joint. This would be to the proximal inner phalangeal joint. P pre-peri-and postoperative course are discussed along with risks and complications. She is aware there is no guarantee to the surgery the possibility of infection recurrence injury to arteries nerves tendons stiffness she is scheduled for excision  cyst debridement PIP joint left index finger as an outpatient under regional anesthesia.  Daryll Brod 03/02/2019, 9:26 AM

## 2019-03-02 NOTE — Anesthesia Preprocedure Evaluation (Addendum)
Anesthesia Evaluation  Patient identified by MRN, date of birth, ID band Patient awake    Reviewed: Allergy & Precautions, H&P , NPO status , Patient's Chart, lab work & pertinent test results, reviewed documented beta blocker date and time   Airway Mallampati: I  TM Distance: >3 FB Neck ROM: full    Dental no notable dental hx. (+) Teeth Intact   Pulmonary neg pulmonary ROS,    Pulmonary exam normal breath sounds clear to auscultation       Cardiovascular Exercise Tolerance: Good hypertension, Pt. on medications and Pt. on home beta blockers negative cardio ROS   Rhythm:regular Rate:Normal     Neuro/Psych negative neurological ROS  negative psych ROS   GI/Hepatic negative GI ROS, Neg liver ROS,   Endo/Other  negative endocrine ROS  Renal/GU negative Renal ROS  negative genitourinary   Musculoskeletal   Abdominal   Peds  Hematology negative hematology ROS (+)   Anesthesia Other Findings   Reproductive/Obstetrics negative OB ROS                            Anesthesia Physical Anesthesia Plan  ASA: II  Anesthesia Plan: MAC   Post-op Pain Management:    Induction:   PONV Risk Score and Plan: 2 and Ondansetron and Treatment may vary due to age or medical condition  Airway Management Planned: Nasal Cannula and Natural Airway  Additional Equipment:   Intra-op Plan:   Post-operative Plan:   Informed Consent: I have reviewed the patients History and Physical, chart, labs and discussed the procedure including the risks, benefits and alternatives for the proposed anesthesia with the patient or authorized representative who has indicated his/her understanding and acceptance.     Dental Advisory Given  Plan Discussed with: CRNA, Anesthesiologist and Surgeon  Anesthesia Plan Comments:        Anesthesia Quick Evaluation

## 2019-03-02 NOTE — Anesthesia Postprocedure Evaluation (Signed)
Anesthesia Post Note  Patient: Morgan Sandoval  Procedure(s) Performed: EXCISION CYST, DEBRIDEMENT PROXIMAL INTERPHALANGEAL JOINT LEFT INDEX FINGER (Left Finger)     Patient location during evaluation: PACU Anesthesia Type: MAC Level of consciousness: awake and alert Pain management: pain level controlled Vital Signs Assessment: post-procedure vital signs reviewed and stable Respiratory status: spontaneous breathing, nonlabored ventilation, respiratory function stable and patient connected to nasal cannula oxygen Cardiovascular status: stable and blood pressure returned to baseline Postop Assessment: no apparent nausea or vomiting Anesthetic complications: no    Last Vitals:  Vitals:   03/02/19 1130 03/02/19 1139  BP: (!) 145/71 (!) 152/76  Pulse: 61 61  Resp: 19 16  Temp:  37 C  SpO2: 97% 94%    Last Pain:  Vitals:   03/02/19 1139  TempSrc: Oral  PainSc: 0-No pain                 Diondra Pines

## 2019-03-02 NOTE — Op Note (Addendum)
NAME: Morgan Sandoval MEDICAL RECORD NO: [id number] DATE OF BIRTH: 03-15-1935 FACILITY: [facility] LOCATION: [facility] PHYSICIAN: Wynonia Sours, MD   OPERATIVE REPORT   DATE OF PROCEDURE: 03/02/19    PREOPERATIVE DIAGNOSIS:   Ganglion cyst PIP joint left index finger with degenerative arthritis PIP joint   POSTOPERATIVE DIAGNOSIS:   Same   PROCEDURE:   Excision of cyst with debridement of joint removal of loose body   SURGEON: Daryll Brod, M.D.   ASSISTANT: none   ANESTHESIA:  Local with sedation   INTRAVENOUS FLUIDS:  Per anesthesia flow sheet.   ESTIMATED BLOOD LOSS:  Minimal.   COMPLICATIONS:  None.   SPECIMENS:   Cyst and loose body   TOURNIQUET TIME:   * No tourniquets in log *   DISPOSITION:  Stable to PACU.   INDICATIONS: Patient is a 84 year old female with a history of a large cyst over the PIP joint of her left index finger.  X-rays reveal degenerative changes in the joint.  Is admitted for excision of the cyst debridement of the joint.  Pre-peri-and postoperative course been discussed along with risks and complications.  She is aware that there is no guarantee to the surgery the possibility of infection recurrence injury to arteries nerves tendons or complete relief symptoms distally possibility of stiffness to the joint loss of mobility.  Recurrence of the cyst.  In the preoperative area the patient is seen the extremity marked by both patient and surgeon antibiotic given  OPERATIVE COURSE: Patient is brought to the operating room where she was placed in a supine position prepped and draped prepped being done by ChloraPrep.  A three-minute dry time was allowed and a timeout taken to confirm patient procedure a metacarpal block was then given with quarter percent bupivacaine 1% Xylocaine both without epinephrine approximately 8 cc was used.  After adequate anesthesia was afforded a Penrose drain was placed at the base of the index finger left hand.  A  curvilinear incision was made directly over the mass carried down through subcutaneous tissue mass was immediately encountered with expression of clear gelatinous fluid.  The cyst was excised in toto and sent to pathology the joint was then opened after elevation of the lateral band and central slip and a loose body immediately extruded from the joint after opening it.  This was sent to pathology.  A synovectomy was then performed to the joint itself with debridement of small osteophytes in the proximal phalanx using the hemostatic rondure.  The wound was copiously irrigated with saline.  The tendon was allowed to re-position itself over the dorsum of the PIP joint.  A closure of the wound was then performed with interrupted 4-0 nylon sutures.  A sterile compressive dressing dorsal splint was applied after removal of the Penrose drain.  I deflation of the tourniquet all fingers immediately pink.  She was taken to the recovery room for observation in satisfactory condition.  She will be discharged home to return the [facility] in 1 week she will try Tylenol ibuprofen and have Ultram as a backup.   Daryll Brod, MD Electronically signed, 03/02/19

## 2019-03-02 NOTE — Discharge Instructions (Addendum)

## 2019-03-03 ENCOUNTER — Encounter (HOSPITAL_BASED_OUTPATIENT_CLINIC_OR_DEPARTMENT_OTHER): Payer: Self-pay | Admitting: Orthopedic Surgery

## 2019-03-03 NOTE — Addendum Note (Signed)
Addendum  created 03/03/19 0940 by Janeece Riggers, MD   Attestation recorded in [place], [place] filed

## 2019-03-15 HISTORY — PX: GANGLION CYST EXCISION: SHX1691

## 2019-04-28 ENCOUNTER — Telehealth (INDEPENDENT_AMBULATORY_CARE_PROVIDER_SITE_OTHER): Payer: Self-pay | Admitting: *Deleted

## 2019-04-28 ENCOUNTER — Other Ambulatory Visit: Payer: Self-pay

## 2019-04-28 ENCOUNTER — Encounter (INDEPENDENT_AMBULATORY_CARE_PROVIDER_SITE_OTHER): Payer: Self-pay | Admitting: *Deleted

## 2019-04-28 ENCOUNTER — Ambulatory Visit (INDEPENDENT_AMBULATORY_CARE_PROVIDER_SITE_OTHER): Payer: Medicare Other | Admitting: Internal Medicine

## 2019-04-28 ENCOUNTER — Encounter (INDEPENDENT_AMBULATORY_CARE_PROVIDER_SITE_OTHER): Payer: Self-pay | Admitting: Internal Medicine

## 2019-04-28 VITALS — BP 137/74 | HR 64 | Temp 97.9°F | Resp 18 | Ht 65.0 in | Wt 124.2 lb

## 2019-04-28 DIAGNOSIS — R197 Diarrhea, unspecified: Secondary | ICD-10-CM | POA: Diagnosis not present

## 2019-04-28 DIAGNOSIS — R195 Other fecal abnormalities: Secondary | ICD-10-CM

## 2019-04-28 MED ORDER — PEG 3350-KCL-NA BICARB-NACL 420 G PO SOLR: 4000.0000 mL | Freq: Once | ORAL | 0 refills | Status: AC

## 2019-04-28 NOTE — Telephone Encounter (Signed)
Patient needs trilyte 

## 2019-04-28 NOTE — Progress Notes (Signed)
   Subjective:    Patient ID: Morgan Sandoval, female    DOB: 07/24/1935, 84 y.o.   MRN: [id number]  HPI: 84 y.o. female with a PMH significant for HTN, osteoporosis, colon polyps, s/p right hemicolectomy in 2002 "due to suspicious polyps". She was seen by her PCP 02/2019 and a stool test was + for occult blood. A repeat stool test 04/18/2019 was again positive for occult blood. She reports having chronic watery to mud like diarrhea daily since her colon resection. She solid stools intermittently, 2 to 3 times weekly. No rectal bleeding or melena. Yesterday she had loose stools every 30 minutes without associated abdominal pain. No obvious food triggers. No recent antibiotics. Today, her diarrhea is much better, passed 2 mud like stool this am. No ASA or po NSAID use. She uses Voltaren gel for hip pain.   Her most recent colonoscopy was 05/18/2015 which identified multiple diverticula to the sigmoid colon,a small rectal polyp, distal rectal scar from previous polypectomy and small external hemorrhoids.   A colonoscopy 03/15/2010 showed a patent ileocolonic anastomosis, multiple diverticula to the sigmoid and descending colon, 2 polyps removed from the rectum and a large rectal scar noted at the site of her previus submucosal resection.     Review of Systems: + 10 lb weight loss past year. Chronic diarrhea.  Chronic bilateral hip pain. No CP or SOB. All other systems reviewed and negative.     Objective:   Physical Exam  General: alert 84 y.o. female in NAD Eyes: no scleral icterus. Heart: RRR, no murmurs.  Lungs: clear throughout. Abdomen: soft, nontender, no masses or organomegaly. RUQ scan, central lower abdominal scar intact Extremities: no edema, numerous varicosities.     Assessment & Plan:   1. 84 y.o. female with a history of colon polyps, s/p colon resection 2002 presents with heme + stool. Chronic diarrhea since her colon surgery, had increased diarrhea yesterday which has improved today.  -schedule a diagnostic colonoscopy with Dr. Laural Golden  -Florastor probiotic one capsule twice daily -avoid dairy products for 2 weeks -GI panel  -call office if diarrhea worsens  -request copy of CBC from PCP's office   2. 10lb unexplained weight loss over the past 12 months

## 2019-04-28 NOTE — Patient Instructions (Addendum)
1. Request copy of most recent CBC from PCP's office. 2. Schedule a colonoscopy with Dr. Laural Golden 3. Further follow up to be determined after colonoscopy completed. 4. Florastor probiotic 1 capsule by mouth twice daily x 2 weeks 5. Avoid Dairy products for 2 weeks 6. Call office if diarrhea worsens 7. GI panel including C.diff

## 2019-04-28 NOTE — Telephone Encounter (Signed)
Patient needs suprep 

## 2019-04-29 DIAGNOSIS — R197 Diarrhea, unspecified: Secondary | ICD-10-CM | POA: Insufficient documentation

## 2019-04-29 DIAGNOSIS — R195 Other fecal abnormalities: Secondary | ICD-10-CM | POA: Insufficient documentation

## 2019-04-29 MED ORDER — SUPREP BOWEL PREP KIT 17.5-3.13-1.6 GM/177ML PO SOLN: 1.0000 | Freq: Once | ORAL | 0 refills | Status: AC

## 2019-05-03 LAB — GASTROINTESTINAL PATHOGEN PANEL PCR
C. difficile Tox A/B, PCR: NOT DETECTED
Campylobacter, PCR: NOT DETECTED
Cryptosporidium, PCR: NOT DETECTED
E coli (ETEC) LT/ST PCR: NOT DETECTED
E coli (STEC) stx1/stx2, PCR: NOT DETECTED
E coli 0157, PCR: NOT DETECTED
Giardia lamblia, PCR: NOT DETECTED
Norovirus, PCR: NOT DETECTED
Rotavirus A, PCR: NOT DETECTED
Salmonella, PCR: NOT DETECTED
Shigella, PCR: NOT DETECTED

## 2019-05-05 ENCOUNTER — Telehealth (INDEPENDENT_AMBULATORY_CARE_PROVIDER_SITE_OTHER): Payer: Self-pay | Admitting: Internal Medicine

## 2019-05-05 NOTE — Telephone Encounter (Signed)
Patient returned your call  Ph# [phone]

## 2019-05-05 NOTE — Telephone Encounter (Signed)
noted 

## 2019-05-17 ENCOUNTER — Other Ambulatory Visit (HOSPITAL_COMMUNITY)
Admission: RE | Admit: 2019-05-17 | Discharge: 2019-05-17 | Disposition: A | Discharge status: Home / Self Care | Payer: Medicare Other | Source: Ambulatory Visit | Attending: Internal Medicine | Admitting: Internal Medicine

## 2019-05-17 DIAGNOSIS — Z01812 Encounter for preprocedural laboratory examination: Secondary | ICD-10-CM | POA: Diagnosis present

## 2019-05-17 DIAGNOSIS — K635 Polyp of colon: Secondary | ICD-10-CM | POA: Diagnosis not present

## 2019-05-17 DIAGNOSIS — K649 Unspecified hemorrhoids: Secondary | ICD-10-CM | POA: Diagnosis not present

## 2019-05-17 DIAGNOSIS — K5790 Diverticulosis of intestine, part unspecified, without perforation or abscess without bleeding: Secondary | ICD-10-CM | POA: Insufficient documentation

## 2019-05-17 DIAGNOSIS — Z20828 Contact with and (suspected) exposure to other viral communicable diseases: Secondary | ICD-10-CM | POA: Insufficient documentation

## 2019-05-17 DIAGNOSIS — K6389 Other specified diseases of intestine: Secondary | ICD-10-CM | POA: Diagnosis not present

## 2019-05-17 LAB — SARS CORONAVIRUS 2 (TAT 6-24 HRS): SARS Coronavirus 2: NEGATIVE

## 2019-05-19 ENCOUNTER — Other Ambulatory Visit: Payer: Self-pay

## 2019-05-19 ENCOUNTER — Encounter (HOSPITAL_COMMUNITY)
Admission: RE | Disposition: A | Discharge status: Home / Self Care | Payer: Self-pay | Source: Home / Self Care | Attending: Internal Medicine

## 2019-05-19 ENCOUNTER — Ambulatory Visit (HOSPITAL_COMMUNITY)
Admission: RE | Admit: 2019-05-19 | Discharge: 2019-05-19 | Disposition: A | Discharge status: Home / Self Care | Payer: Medicare Other | Attending: Internal Medicine | Admitting: Internal Medicine

## 2019-05-19 ENCOUNTER — Encounter (HOSPITAL_COMMUNITY): Payer: Self-pay | Admitting: *Deleted

## 2019-05-19 DIAGNOSIS — K5730 Diverticulosis of large intestine without perforation or abscess without bleeding: Secondary | ICD-10-CM | POA: Insufficient documentation

## 2019-05-19 DIAGNOSIS — R197 Diarrhea, unspecified: Secondary | ICD-10-CM | POA: Insufficient documentation

## 2019-05-19 DIAGNOSIS — Z980 Intestinal bypass and anastomosis status: Secondary | ICD-10-CM | POA: Insufficient documentation

## 2019-05-19 DIAGNOSIS — Z87891 Personal history of nicotine dependence: Secondary | ICD-10-CM | POA: Insufficient documentation

## 2019-05-19 DIAGNOSIS — K644 Residual hemorrhoidal skin tags: Secondary | ICD-10-CM | POA: Insufficient documentation

## 2019-05-19 DIAGNOSIS — Z79810 Long term (current) use of selective estrogen receptor modulators (SERMs): Secondary | ICD-10-CM | POA: Insufficient documentation

## 2019-05-19 DIAGNOSIS — R195 Other fecal abnormalities: Secondary | ICD-10-CM

## 2019-05-19 DIAGNOSIS — K529 Noninfective gastroenteritis and colitis, unspecified: Secondary | ICD-10-CM | POA: Insufficient documentation

## 2019-05-19 DIAGNOSIS — Z79899 Other long term (current) drug therapy: Secondary | ICD-10-CM | POA: Insufficient documentation

## 2019-05-19 DIAGNOSIS — Z800 Family history of malignant neoplasm of digestive organs: Secondary | ICD-10-CM | POA: Insufficient documentation

## 2019-05-19 DIAGNOSIS — Z8719 Personal history of other diseases of the digestive system: Secondary | ICD-10-CM | POA: Diagnosis not present

## 2019-05-19 DIAGNOSIS — I10 Essential (primary) hypertension: Secondary | ICD-10-CM | POA: Insufficient documentation

## 2019-05-19 HISTORY — PX: COLONOSCOPY: SHX5424

## 2019-05-19 HISTORY — PX: BIOPSY: SHX5522

## 2019-05-19 SURGERY — COLONOSCOPY
Anesthesia: Moderate Sedation

## 2019-05-19 MED ORDER — MEPERIDINE HCL 50 MG/ML IJ SOLN
INTRAMUSCULAR | Status: AC
  Filled 2019-05-19: qty 1

## 2019-05-19 MED ORDER — STERILE WATER FOR IRRIGATION IR SOLN
Status: DC | PRN
  Administered 2019-05-19: 1.5 mL

## 2019-05-19 MED ORDER — MIDAZOLAM HCL 5 MG/5ML IJ SOLN
INTRAMUSCULAR | Status: DC | PRN
  Administered 2019-05-19: 1 mg via INTRAVENOUS
  Administered 2019-05-19: 2 mg via INTRAVENOUS
  Administered 2019-05-19 (×2): 1 mg via INTRAVENOUS

## 2019-05-19 MED ORDER — LOPERAMIDE HCL 2 MG PO CAPS: 2.0000 mg | ORAL_CAPSULE | Freq: Two times a day (BID) | ORAL | Status: DC

## 2019-05-19 MED ORDER — SODIUM CHLORIDE 0.9 % IV SOLN
INTRAVENOUS | Status: DC
  Administered 2019-05-19: 08:00:00 1000 mL via INTRAVENOUS

## 2019-05-19 MED ORDER — MEPERIDINE HCL 50 MG/ML IJ SOLN
INTRAMUSCULAR | Status: DC | PRN
  Administered 2019-05-19: 10 mg
  Administered 2019-05-19: 25 mg
  Administered 2019-05-19: 15 mg

## 2019-05-19 MED ORDER — MIDAZOLAM HCL 5 MG/5ML IJ SOLN
INTRAMUSCULAR | Status: AC
  Filled 2019-05-19: qty 10

## 2019-05-19 NOTE — H&P (Signed)
Morgan Sandoval is an 84 y.o. female.   Chief Complaint: Patient is here for colonoscopy. HPI: Patient is 84 year old Caucasian female who has a history of colonic polyps was found to have heme positive stool.  She has chronic diarrhea dating back to right hemicolectomy for large polyp several years ago.  She has intermittent flareups.  She denies abdominal pain or rectal bleeding.  She has good appetite and her weight stable. Family history significant for CRC in a brother who was diagnosed with rectal carcinoma at age 36 and underwent chemoradiation prior to surgery.  He is doing fine 3 years later. Patient does not take NSAIDs or anticoagulants.  Past Medical History:  Diagnosis Date  . Cholelithiasis   . Chronic diarrhea   . Colon polyp   . Diverticulosis   . GERD (gastroesophageal reflux disease)    if she eats acidic foods  . Hypertension   . Mucoid cyst of joint    left index finger  . Osteoporosis     Past Surgical History:  Procedure Laterality Date  . APPENDECTOMY    . CHOLECYSTECTOMY    . COLON SURGERY     Removed large polyps  . COLONOSCOPY    . COLONOSCOPY N/A 05/18/2015   Procedure: COLONOSCOPY;  Surgeon: Rogene Houston, MD;  [facility];  Service: Endoscopy;  Laterality: N/A;  1030  . GANGLION CYST EXCISION Left 03/2019   the fore finger  . MASS EXCISION Left 03/02/2019   Procedure: EXCISION CYST, DEBRIDEMENT PROXIMAL INTERPHALANGEAL JOINT LEFT INDEX FINGER;  Surgeon: Daryll Brod, MD;  Location: [employer];  Service: Orthopedics;  Laterality: Left;  . RECTAL SURGERY  2000s   [facility]  . TONSILLECTOMY      Family History  Problem Relation Age of Onset  . Cancer - Colon Brother    Social History:  reports that she has quit smoking. She has never used smokeless tobacco. She reports current alcohol use. She reports that she does not use drugs.  Allergies:  Allergies  Allergen Reactions  . Sulfa Antibiotics Rash    Medications  Prior to Admission  Medication Sig Dispense Refill  . Cyanocobalamin (B-12) 5000 MCG CAPS Take 5,000 mcg by mouth daily.    . diclofenac sodium (VOLTAREN) 1 % GEL Apply 1 application topically 2 (two) times daily.     Marland Kitchen lisinopril (PRINIVIL,ZESTRIL) 5 MG tablet Take 5 mg by mouth daily.    . nebivolol (BYSTOLIC) 10 MG tablet Take 10 mg by mouth daily.    . raloxifene (EVISTA) 60 MG tablet Take 60 mg by mouth daily.    Marland Kitchen spironolactone (ALDACTONE) 25 MG tablet Take 25 mg by mouth daily.      No results found for this or any previous visit (from the past 48 hour(s)). No results found.  ROS  Blood pressure (!) 158/77, pulse (!) 59, temperature 97.8 F (36.6 C), temperature source Oral, resp. rate 12, height 5\' 5"  (1.651 m), weight 55.8 kg, SpO2 100 %. Physical Exam  Constitutional: She appears well-developed and well-nourished.  HENT:  Mouth/Throat: Oropharynx is clear and moist.  Eyes: Conjunctivae are normal. No scleral icterus.  Neck: No thyromegaly present.  Cardiovascular: Normal rate and regular rhythm.  Murmur heard. Faint systolic murmur at left sternal border.  Respiratory: Effort normal and breath sounds normal.  GI:  Abdomen is symmetrical with free scars on the right side.  Abdomen is soft and nontender with organomegaly or masses.  Musculoskeletal:  General: No edema.  Lymphadenopathy:    She has no cervical adenopathy.  Neurological: She is alert.  Skin: Skin is warm and dry.     Assessment/Plan Heme positive stool. History of colonic polyps. Diagnostic colonoscopy.  Morgan Laser, MD 05/19/2019, 8:50 AM

## 2019-05-19 NOTE — Op Note (Signed)
[facility] Patient Name: Morgan Sandoval Procedure Date: 05/19/2019 8:42 AM MRN: [id number] Date of Birth: 12/01/1934 Attending MD: Hildred Laser , MD CSN: [id number] Age: 84 Admit Type: Outpatient Procedure:                Colonoscopy Indications:              Heme positive stool, Chronic diarrhea Providers:                Hildred Laser, MD, Gerome Sam, RN, Nelma Rothman,                            Technician Referring MD:             [name], MD Medicines:                Meperidine 50 mg IV, Midazolam 5 mg IV Complications:            No immediate complications. Estimated Blood Loss:     Estimated blood loss was minimal. Procedure:                Pre-Anesthesia Assessment:                           - Prior to the procedure, a History and Physical                            was performed, and patient medications and                            allergies were reviewed. The patient's tolerance of                            previous anesthesia was also reviewed. The risks                            and benefits of the procedure and the sedation                            options and risks were discussed with the patient.                            All questions were answered, and informed consent                            was obtained. Prior Anticoagulants: The patient has                            taken no previous anticoagulant or antiplatelet                            agents. ASA Grade Assessment: II - A patient with                            mild systemic disease. After reviewing the risks  and benefits, the patient was deemed in                            satisfactory condition to undergo the procedure.                           After obtaining informed consent, the colonoscope                            was passed under direct vision. Throughout the                            procedure, the patient's blood pressure, pulse, and                oxygen saturations were monitored continuously. The                            [id number]-[id number] ([id number]) scope was introduced through                            the anus and advanced to the the ileocolonic                            anastomosis. The colonoscopy was somewhat difficult                            due to restricted mobility of the colon. Successful                            completion of the procedure was aided by                            withdrawing the scope and replacing with the                            'babyscope'. The patient tolerated the procedure                            well. The quality of the bowel preparation was                            good. The terminal ileum and the rectum were                            photographed. Scope In: 9:12:44 AM Scope Out: 9:22:31 AM Scope Withdrawal Time: 0 hours 7 minutes 20 seconds  Total Procedure Duration: 0 hours 9 minutes 47 seconds  Findings:      The perianal and digital rectal examinations were normal.      The neo-terminal ileum appeared normal.      There was evidence of a prior end-to-side ileo-colonic anastomosis at       the hepatic flexure. This was patent.      The descending colon, splenic flexure and transverse colon appeared       normal.  Multiple small and large-mouthed diverticula were found in the sigmoid       colon.      There is no endoscopic evidence of any significant findings in the       sigmoid colon. Biopsies for histology were taken with a cold forceps       from the sigmoid colon for evaluation of microscopic colitis.      External hemorrhoids were found during retroflexion. Impression:               - The examined portion of the ileum was normal.                           - Patent end-to-side ileo-colonic anastomosis.                           - The transverse colon is normal.                           - The descending colon, splenic flexure and                             transverse colon are normal.                           - Diverticulosis in the sigmoid colon.                           - External hemorrhoids. Moderate Sedation:      Moderate (conscious) sedation was administered by the endoscopy nurse       and supervised by the endoscopist. The following parameters were       monitored: oxygen saturation, heart rate, blood pressure, CO2       capnography and response to care. Total physician intraservice time was       26 minutes. Recommendation:           - Patient has a contact number available for                            emergencies. The signs and symptoms of potential                            delayed complications were discussed with the                            patient. Return to normal activities tomorrow.                            Written discharge instructions were provided to the                            patient.                           - High fiber diet today.                           - Continue present medications.                           -  Await pathology results.                           - No recommendation at this time regarding repeat                            colonoscopy. Procedure Code(s):        --- Professional ---                           [phone], Colonoscopy, flexible; with biopsy, single                            or multiple                           99153, Moderate sedation; each additional 15                            minutes intraservice time                           G0500, Moderate sedation services provided by the                            same physician or other qualified health care                            professional performing a gastrointestinal                            endoscopic service that sedation supports,                            requiring the presence of an independent trained                            observer to assist in the monitoring of the                            patient's  level of consciousness and physiological                            status; initial 15 minutes of intra-service time;                            patient age 3 years or older (additional time may                            be reported with [phone], as appropriate) Diagnosis Code(s):        --- Professional ---                           K64.4, Residual hemorrhoidal skin tags  Z98.0, Intestinal bypass and anastomosis status                           R19.5, Other fecal abnormalities                           K52.9, Noninfective gastroenteritis and colitis,                            unspecified                           K57.30, Diverticulosis of large intestine without                            perforation or abscess without bleeding CPT copyright 2019 American [employer] Association. All rights reserved. The codes documented in this report are preliminary and upon coder review may  be revised to meet current compliance requirements. Hildred Laser, MD Hildred Laser, MD 05/19/2019 9:35:08 AM This report has been signed electronically. Number of Addenda: 0

## 2019-05-19 NOTE — Discharge Instructions (Signed)
Resume usual medications as before. Imodium OTC/loperamide 2 mg by mouth before breakfast and lunch every day. High-fiber diet. No driving for 24 hours. Physician will call with biopsy results.      Colonoscopy, Adult, Care After This sheet gives you information about how to care for yourself after your procedure. Your doctor may also give you more specific instructions. If you have problems or questions, call your doctor. What can I expect after the procedure? After the procedure, it is common to have:  A small amount of blood in your poop for 24 hours.  Some gas.  Mild cramping or bloating in your belly. Follow these instructions at home: General instructions  For the first 24 hours after the procedure: ? Do not drive or use machinery. ? Do not sign important documents. ? Do not drink alcohol. ? Do your daily activities more slowly than normal. ? Eat foods that are soft and easy to digest.  Take over-the-counter or prescription medicines only as told by your doctor. To help cramping and bloating:   Try walking around.  Put heat on your belly (abdomen) as told by your doctor. Use a heat source that your doctor recommends, such as a moist heat pack or a heating pad. ? Put a towel between your skin and the heat source. ? Leave the heat on for 20-30 minutes. ? Remove the heat if your skin turns bright red. This is especially important if you cannot feel pain, heat, or cold. You can get burned. Eating and drinking   Drink enough fluid to keep your pee (urine) clear or pale yellow.  Return to your normal diet as told by your doctor. Avoid heavy or fried foods that are hard to digest.  Avoid drinking alcohol for as long as told by your doctor. Contact a doctor if:  You have blood in your poop (stool) 2-3 days after the procedure. Get help right away if:  You have more than a small amount of blood in your poop.  You see large clumps of tissue (blood clots) in your  poop.  Your belly is swollen.  You feel sick to your stomach (nauseous).  You throw up (vomit).  You have a fever.  You have belly pain that gets worse, and medicine does not help your pain. Summary  After the procedure, it is common to have a small amount of blood in your poop. You may also have mild cramping and bloating in your belly.  For the first 24 hours after the procedure, do not drive or use machinery, do not sign important documents, and do not drink alcohol.  Get help right away if you have a lot of blood in your poop, feel sick to your stomach, have a fever, or have more belly pain. This information is not intended to replace advice given to you by your health care provider. Make sure you discuss any questions you have with your health care provider. Document Released: 11/02/2010 Document Revised: 07/31/2017 Document Reviewed: 06/24/2016 Elsevier Patient Education  [place].     High-Fiber Diet Fiber, also called dietary fiber, is a type of carbohydrate that is found in fruits, vegetables, whole grains, and beans. A high-fiber diet can have many health benefits. Your health care provider may recommend a high-fiber diet to help:  Prevent constipation. Fiber can make your bowel movements more regular.  Lower your cholesterol.  Relieve the following conditions: ? Swelling of veins in the anus (hemorrhoids). ? Swelling and irritation (inflammation)  of specific areas of the digestive tract (uncomplicated diverticulosis). ? A problem of the large intestine (colon) that sometimes causes pain and diarrhea (irritable bowel syndrome, IBS).  Prevent overeating as part of a weight-loss plan.  Prevent heart disease, type 2 diabetes, and certain cancers. What is my plan? The recommended daily fiber intake in grams (g) includes:  38 g for men age 82 or younger.  30 g for men over age 79.  77 g for women age 11 or younger.  21 g for women over age 57. You can  get the recommended daily intake of dietary fiber by:  Eating a variety of fruits, vegetables, grains, and beans.  Taking a fiber supplement, if it is not possible to get enough fiber through your diet. What do I need to know about a high-fiber diet?  It is better to get fiber through food sources rather than from fiber supplements. There is not a lot of research about how effective supplements are.  Always check the fiber content on the nutrition facts label of any prepackaged food. Look for foods that contain 5 g of fiber or more per serving.  Talk with a diet and nutrition specialist (dietitian) if you have questions about specific foods that are recommended or not recommended for your medical condition, especially if those foods are not listed below.  Gradually increase how much fiber you consume. If you increase your intake of dietary fiber too quickly, you may have bloating, cramping, or gas.  Drink plenty of water. Water helps you to digest fiber. What are tips for following this plan?  Eat a wide variety of high-fiber foods.  Make sure that half of the grains that you eat each day are whole grains.  Eat breads and cereals that are made with whole-grain flour instead of refined flour or white flour.  Eat brown rice, bulgur wheat, or millet instead of white rice.  Start the day with a breakfast that is high in fiber, such as a cereal that contains 5 g of fiber or more per serving.  Use beans in place of meat in soups, salads, and pasta dishes.  Eat high-fiber snacks, such as berries, raw vegetables, nuts, and popcorn.  Choose whole fruits and vegetables instead of processed forms like juice or sauce. What foods can I eat?  Fruits Berries. Pears. Apples. Oranges. Avocado. Prunes and raisins. Dried figs. Vegetables Sweet potatoes. Spinach. Kale. Artichokes. Cabbage. Broccoli. Cauliflower. Green peas. Carrots. Squash. Grains Whole-grain breads. Multigrain cereal. Oats and  oatmeal. Brown rice. Barley. Bulgur wheat. [place]. Quinoa. Bran muffins. Popcorn. Rye wafer crackers. Meats and other proteins [place], kidney, and pinto beans. Soybeans. Split peas. Lentils. Nuts and seeds. Dairy Fiber-fortified yogurt. Beverages Fiber-fortified soy milk. Fiber-fortified orange juice. Other foods Fiber bars. The items listed above may not be a complete list of recommended foods and beverages. Contact a dietitian for more options. What foods are not recommended? Fruits Fruit juice. Cooked, strained fruit. Vegetables Fried potatoes. Canned vegetables. Well-cooked vegetables. Grains White bread. Pasta made with refined flour. White rice. Meats and other proteins Fatty cuts of meat. Fried chicken or fried fish. Dairy Milk. Yogurt. Cream cheese. Sour cream. Fats and oils Butters. Beverages Soft drinks. Other foods Cakes and pastries. The items listed above may not be a complete list of foods and beverages to avoid. Contact a dietitian for more information. Summary  Fiber is a type of carbohydrate. It is found in fruits, vegetables, whole grains, and beans.  There are many  health benefits of eating a high-fiber diet, such as preventing constipation, lowering blood cholesterol, helping with weight loss, and reducing your risk of heart disease, diabetes, and certain cancers.  Gradually increase your intake of fiber. Increasing too fast can result in cramping, bloating, and gas. Drink plenty of water while you increase your fiber.  The best sources of fiber include whole fruits and vegetables, whole grains, nuts, seeds, and beans. This information is not intended to replace advice given to you by your health care provider. Make sure you discuss any questions you have with your health care provider. Document Released: 09/30/2005 Document Revised: 08/04/2017 Document Reviewed: 08/04/2017 Elsevier Patient Education  2020 [employer].

## 2019-06-01 ENCOUNTER — Encounter (HOSPITAL_COMMUNITY): Payer: Self-pay | Admitting: Internal Medicine

## 2019-06-15 DIAGNOSIS — I10 Essential (primary) hypertension: Secondary | ICD-10-CM

## 2019-12-10 DIAGNOSIS — K117 Disturbances of salivary secretion: Secondary | ICD-10-CM | POA: Diagnosis not present

## 2019-12-10 DIAGNOSIS — K120 Recurrent oral aphthae: Secondary | ICD-10-CM | POA: Diagnosis not present

## 2019-12-10 DIAGNOSIS — Z87891 Personal history of nicotine dependence: Secondary | ICD-10-CM | POA: Diagnosis not present

## 2019-12-10 DIAGNOSIS — K146 Glossodynia: Secondary | ICD-10-CM | POA: Diagnosis not present

## 2020-01-24 DIAGNOSIS — H02112 Cicatricial ectropion of right lower eyelid: Secondary | ICD-10-CM | POA: Diagnosis not present

## 2020-01-24 DIAGNOSIS — H02532 Eyelid retraction right lower eyelid: Secondary | ICD-10-CM | POA: Diagnosis not present

## 2020-01-24 DIAGNOSIS — H02535 Eyelid retraction left lower eyelid: Secondary | ICD-10-CM | POA: Diagnosis not present

## 2020-01-24 DIAGNOSIS — Z09 Encounter for follow-up examination after completed treatment for conditions other than malignant neoplasm: Secondary | ICD-10-CM | POA: Diagnosis not present

## 2020-01-24 DIAGNOSIS — H04521 Eversion of right lacrimal punctum: Secondary | ICD-10-CM | POA: Diagnosis not present

## 2020-02-11 DIAGNOSIS — R42 Dizziness and giddiness: Secondary | ICD-10-CM | POA: Diagnosis not present

## 2020-02-11 DIAGNOSIS — I10 Essential (primary) hypertension: Secondary | ICD-10-CM | POA: Diagnosis not present

## 2020-03-07 DIAGNOSIS — H02535 Eyelid retraction left lower eyelid: Secondary | ICD-10-CM | POA: Diagnosis not present

## 2020-03-07 DIAGNOSIS — Z09 Encounter for follow-up examination after completed treatment for conditions other than malignant neoplasm: Secondary | ICD-10-CM | POA: Diagnosis not present

## 2020-03-07 DIAGNOSIS — H02115 Cicatricial ectropion of left lower eyelid: Secondary | ICD-10-CM | POA: Diagnosis not present

## 2020-03-07 DIAGNOSIS — H02532 Eyelid retraction right lower eyelid: Secondary | ICD-10-CM | POA: Diagnosis not present

## 2020-03-07 DIAGNOSIS — H02112 Cicatricial ectropion of right lower eyelid: Secondary | ICD-10-CM | POA: Diagnosis not present

## 2020-04-26 DIAGNOSIS — L820 Inflamed seborrheic keratosis: Secondary | ICD-10-CM | POA: Diagnosis not present

## 2020-04-26 DIAGNOSIS — L438 Other lichen planus: Secondary | ICD-10-CM | POA: Diagnosis not present

## 2020-05-15 DIAGNOSIS — L110 Acquired keratosis follicularis: Secondary | ICD-10-CM | POA: Diagnosis not present

## 2020-05-15 DIAGNOSIS — M79671 Pain in right foot: Secondary | ICD-10-CM | POA: Diagnosis not present

## 2020-05-15 DIAGNOSIS — M79672 Pain in left foot: Secondary | ICD-10-CM | POA: Diagnosis not present

## 2020-05-15 DIAGNOSIS — I739 Peripheral vascular disease, unspecified: Secondary | ICD-10-CM | POA: Diagnosis not present

## 2020-05-16 DIAGNOSIS — I10 Essential (primary) hypertension: Secondary | ICD-10-CM | POA: Diagnosis not present

## 2020-05-16 DIAGNOSIS — Z299 Encounter for prophylactic measures, unspecified: Secondary | ICD-10-CM | POA: Diagnosis not present

## 2020-05-16 DIAGNOSIS — I272 Pulmonary hypertension, unspecified: Secondary | ICD-10-CM | POA: Diagnosis not present

## 2020-05-16 DIAGNOSIS — I739 Peripheral vascular disease, unspecified: Secondary | ICD-10-CM | POA: Diagnosis not present

## 2020-06-13 DIAGNOSIS — I10 Essential (primary) hypertension: Secondary | ICD-10-CM | POA: Diagnosis not present

## 2020-06-20 DIAGNOSIS — I10 Essential (primary) hypertension: Secondary | ICD-10-CM | POA: Diagnosis not present

## 2020-06-20 DIAGNOSIS — I272 Pulmonary hypertension, unspecified: Secondary | ICD-10-CM | POA: Diagnosis not present

## 2020-06-20 DIAGNOSIS — Z299 Encounter for prophylactic measures, unspecified: Secondary | ICD-10-CM | POA: Diagnosis not present

## 2020-06-20 DIAGNOSIS — I739 Peripheral vascular disease, unspecified: Secondary | ICD-10-CM | POA: Diagnosis not present

## 2020-07-13 DIAGNOSIS — I10 Essential (primary) hypertension: Secondary | ICD-10-CM | POA: Diagnosis not present

## 2020-07-18 DIAGNOSIS — M545 Low back pain, unspecified: Secondary | ICD-10-CM | POA: Diagnosis not present

## 2020-07-18 DIAGNOSIS — I10 Essential (primary) hypertension: Secondary | ICD-10-CM | POA: Diagnosis not present

## 2020-07-18 DIAGNOSIS — Z299 Encounter for prophylactic measures, unspecified: Secondary | ICD-10-CM | POA: Diagnosis not present

## 2020-07-18 DIAGNOSIS — S32019A Unspecified fracture of first lumbar vertebra, initial encounter for closed fracture: Secondary | ICD-10-CM | POA: Diagnosis not present

## 2020-07-18 DIAGNOSIS — I7090 Unspecified atherosclerosis: Secondary | ICD-10-CM | POA: Diagnosis not present

## 2020-07-18 DIAGNOSIS — M4316 Spondylolisthesis, lumbar region: Secondary | ICD-10-CM | POA: Diagnosis not present

## 2020-07-20 ENCOUNTER — Other Ambulatory Visit (HOSPITAL_COMMUNITY): Payer: Self-pay | Admitting: Internal Medicine

## 2020-07-20 ENCOUNTER — Other Ambulatory Visit: Payer: Self-pay | Admitting: Internal Medicine

## 2020-07-20 DIAGNOSIS — S32010A Wedge compression fracture of first lumbar vertebra, initial encounter for closed fracture: Secondary | ICD-10-CM

## 2020-07-21 ENCOUNTER — Ambulatory Visit
Admission: RE | Admit: 2020-07-21 | Discharge: 2020-07-21 | Disposition: A | Discharge status: Home / Self Care | Payer: Medicare Other | Source: Ambulatory Visit | Attending: Internal Medicine | Admitting: Internal Medicine

## 2020-07-21 ENCOUNTER — Other Ambulatory Visit: Payer: Self-pay | Admitting: Internal Medicine

## 2020-07-21 ENCOUNTER — Other Ambulatory Visit: Payer: Self-pay

## 2020-07-21 DIAGNOSIS — S32010A Wedge compression fracture of first lumbar vertebra, initial encounter for closed fracture: Secondary | ICD-10-CM

## 2020-07-21 DIAGNOSIS — M48061 Spinal stenosis, lumbar region without neurogenic claudication: Secondary | ICD-10-CM | POA: Diagnosis not present

## 2020-07-24 DIAGNOSIS — M545 Low back pain, unspecified: Secondary | ICD-10-CM | POA: Diagnosis not present

## 2020-07-24 DIAGNOSIS — S32010A Wedge compression fracture of first lumbar vertebra, initial encounter for closed fracture: Secondary | ICD-10-CM | POA: Diagnosis not present

## 2020-07-26 ENCOUNTER — Other Ambulatory Visit (HOSPITAL_COMMUNITY): Payer: Self-pay | Admitting: Interventional Radiology

## 2020-07-26 ENCOUNTER — Other Ambulatory Visit: Payer: Self-pay | Admitting: Student

## 2020-07-26 DIAGNOSIS — S32010A Wedge compression fracture of first lumbar vertebra, initial encounter for closed fracture: Secondary | ICD-10-CM

## 2020-07-26 NOTE — H&P (Signed)
Chief Complaint: Patient was seen in consultation today for L1 KP/VP.  Referring Physician(s): Jerene Bears (PCP)  Supervising Physician: Luanne Bras  Patient Status: [facility] - Out-pt  History of Present Illness: Morgan Sandoval is a 85 y.o. female with a past medical history significant for GERD, cholelithiasis, diverticulosis, HTN and OA who presents today for an L1 KP/VP. Ms. Blinder presented to her PCPs office on 10/5 with complaints of back pain after a fall. An x-ray of the lumbar spine showed a superior endplate fracture at L1 with 20-30% height loss anteriorly. An MRI of the lumbar spine was obtained on 10/8 for further evaluation which again noted compression fracture of the L1 vertebral body with ~50% height loss and mild marrow edema suggesting subacute fracture with small retropulsion of the posterior wall into the spinal canal without significant spinal canal stenosis. She has been referred to [facility] for image guided L1 kyphoplasty/vertebroplasty for symptom control.  Past Medical History:  Diagnosis Date  . Cholelithiasis   . Chronic diarrhea   . Colon polyp   . Diverticulosis   . GERD (gastroesophageal reflux disease)    if she eats acidic foods  . Hypertension   . Mucoid cyst of joint    left index finger  . Osteoporosis     Past Surgical History:  Procedure Laterality Date  . APPENDECTOMY    . BIOPSY  05/19/2019   Procedure: BIOPSY;  Surgeon: Rogene Houston, MD;  [facility];  Service: Endoscopy;;  sigmoid colon  . CHOLECYSTECTOMY    . COLON SURGERY     Removed large polyps  . COLONOSCOPY    . COLONOSCOPY N/A 05/18/2015   Procedure: COLONOSCOPY;  Surgeon: Rogene Houston, MD;  [facility];  Service: Endoscopy;  Laterality: N/A;  1030  . COLONOSCOPY N/A 05/19/2019   Procedure: COLONOSCOPY;  Surgeon: Rogene Houston, MD;  [facility];  Service: Endoscopy;  Laterality: N/A;  8:30  . GANGLION CYST EXCISION Left 03/2019    the fore finger  . MASS EXCISION Left 03/02/2019   Procedure: EXCISION CYST, DEBRIDEMENT PROXIMAL INTERPHALANGEAL JOINT LEFT INDEX FINGER;  Surgeon: Daryll Brod, MD;  Location: [place];  Service: Orthopedics;  Laterality: Left;  . RECTAL SURGERY  2000s   [facility]  . TONSILLECTOMY      Allergies: Sulfa antibiotics  Medications: Prior to Admission medications   Medication Sig Start Date End Date Taking? Authorizing Provider  Cyanocobalamin (B-12) 5000 MCG CAPS Take 5,000 mcg by mouth daily.    [provider]  diclofenac sodium (VOLTAREN) 1 % GEL Apply 1 application topically 2 (two) times daily.     [provider]  lisinopril (PRINIVIL,ZESTRIL) 5 MG tablet Take 5 mg by mouth daily.    [provider]  loperamide (IMODIUM) 2 MG capsule Take 1 capsule (2 mg total) by mouth 2 (two) times daily. 05/19/19   Rehman, Mechele Dawley, MD  nebivolol (BYSTOLIC) 10 MG tablet Take 10 mg by mouth daily.    [provider]  raloxifene (EVISTA) 60 MG tablet Take 60 mg by mouth daily.    [provider]  spironolactone (ALDACTONE) 25 MG tablet Take 25 mg by mouth daily.    [provider]     Family History  Problem Relation Age of Onset  . Cancer - Colon Brother     Social History   Socioeconomic History  . Marital status: Married    Spouse name: Not on  file  . Number of children: Not on file  . Years of education: Not on file  . Highest education level: Not on file  Occupational History  . Not on file  Tobacco Use  . Smoking status: Former Research scientist (life sciences)  . Smokeless tobacco: Never Used  Vaping Use  . Vaping Use: Never used  Substance and Sexual Activity  . Alcohol use: Yes    Comment: occ  . Drug use: No  . Sexual activity: Not on file  Other Topics Concern  . Not on file  Social History Narrative  . Not on file   Social Determinants of Health   Financial Resource Strain:   . Difficulty of Paying Living Expenses:  Not on file  Food Insecurity:   . Worried About Charity fundraiser in the Last Year: Not on file  . Ran Out of Food in the Last Year: Not on file  Transportation Needs:   . Lack of Transportation (Medical): Not on file  . Lack of Transportation (Non-Medical): Not on file  Physical Activity:   . Days of Exercise per Week: Not on file  . Minutes of Exercise per Session: Not on file  Stress:   . Feeling of Stress : Not on file  Social Connections:   . Frequency of Communication with Friends and Family: Not on file  . Frequency of Social Gatherings with Friends and Family: Not on file  . Attends Religious Services: Not on file  . Active Member of Clubs or Organizations: Not on file  . Attends Archivist Meetings: Not on file  . Marital Status: Not on file     Review of Systems: A 12 point ROS discussed and pertinent positives are indicated in the HPI above.  All other systems are negative.  Review of Systems  Constitutional: Negative for chills and fever.  Respiratory: Negative for cough and shortness of breath.   Cardiovascular: Negative for chest pain.  Gastrointestinal: Negative for abdominal pain, diarrhea, nausea and vomiting.  Genitourinary: Negative for dysuria and flank pain.  Musculoskeletal: Positive for back pain.  Neurological: Negative for dizziness and headaches.    Vital Signs: BP (!) 190/77   Pulse (!) 58   Temp 97.6 F (36.4 C) (Oral)   Resp 18   Ht 5\' 5"  (1.651 m)   Wt 119 lb (54 kg)   SpO2 98%   BMI 19.80 kg/m   Physical Exam Vitals reviewed.  Constitutional:      General: She is not in acute distress. HENT:     Head: Normocephalic.     Mouth/Throat:     Mouth: Mucous membranes are moist.     Pharynx: Oropharynx is clear. No oropharyngeal exudate or posterior oropharyngeal erythema.  Cardiovascular:     Rate and Rhythm: Regular rhythm. Bradycardia present.  Pulmonary:     Effort: Pulmonary effort is normal.     Breath sounds: Normal  breath sounds.  Abdominal:     General: There is no distension.     Palpations: Abdomen is soft.     Tenderness: There is no abdominal tenderness.  Skin:    General: Skin is warm and dry.  Neurological:     Mental Status: She is alert and oriented to person, place, and time.  Psychiatric:        Mood and Affect: Mood normal.        Behavior: Behavior normal.        Thought Content: Thought content normal.  Judgment: Judgment normal.      MD Evaluation Airway: WNL Heart: WNL Abdomen: WNL Chest/ Lungs: WNL ASA  Classification: 3 Mallampati/Airway Score: Two   Imaging: MR LUMBAR SPINE WO CONTRAST  Result Date: 07/21/2020 CLINICAL DATA:  Compression fracture of L1 vertebra. EXAM: MRI LUMBAR SPINE WITHOUT CONTRAST TECHNIQUE: Multiplanar, multisequence MR imaging of the lumbar spine was performed. No intravenous contrast was administered. COMPARISON:  Plain films July 18, 2020 FINDINGS: Segmentation:  Standard. Alignment: There is a grade 1 anterolisthesis of L3 over L4 and L4 over L5, degenerative. Vertebrae: Compression fracture of the L1 vertebral body resulting in approximately 50% loss of vertebral body height with associated mild marrow edema, suggesting subacute fracture. There is a small retropulsion of the posterior wall into the spinal canal without significant spinal canal stenosis. No other fracture identified. Conus medullaris and cauda equina: Conus extends to the L1 level. Conus and cauda equina appear normal. Paraspinal and other soft tissues: Ectatic common bile duct and intrahepatic bile ducts with small filling defect suggestive of intrahepatic lithiasis. Distended colon. Disc levels: T12-L1: Small retropulsion of the L1 posterior wall and facet degenerative changes. No significant spinal canal or neural foraminal stenosis. L1-2: Facet degenerative changes. No spinal canal or neural foraminal stenosis. L2-3: Disc bulge with superimposed left foraminal disc  protrusion and advanced facet degenerative changes resulting in mild narrowing of the left subarticular zone and mild left neural foraminal narrowing. L3-4: Disc bulge, prominent hypertrophic facet degenerative changes and ligamentum flavum redundancy resulting in moderate spinal canal stenosis. No significant neural foraminal narrowing. Degenerative cystic changes of the inter spinous ligament at this level. L4-5: Loss of disc height, disc bulge, prominent hypertrophic facet degenerative changes and ligamentum flavum redundancy resulting in mild spinal canal stenosis. No significant neural foraminal narrowing. L5-S1: Disc bulge with superimposed left foraminal disc protrusion and mild facet degenerative changes resulting in mild right and severe left neural foraminal narrowing. No significant spinal canal stenosis. IMPRESSION: 1. Compression fracture of the L1 vertebral body resulting in approximately 50% loss of vertebral body height with associated mild marrow edema, suggesting subacute fracture. Small retropulsion of the posterior wall into the spinal canal without significant spinal canal stenosis. 2. Degenerative changes of the lumbar spine as described, with moderate spinal canal stenosis at L3-4 and mild spinal canal stenosis at L4-5. 3. Left foraminal disc protrusion at L5-S1 resulting in severe left neural foraminal narrowing. Electronically Signed   By: Pedro Earls M.D.   On: 07/21/2020 12:07    Labs:  CBC: No results for input(s): WBC, HGB, HCT, PLT in the last 8760 hours.  COAGS: No results for input(s): INR, APTT in the last 8760 hours.  BMP: No results for input(s): NA, K, CL, CO2, GLUCOSE, BUN, CALCIUM, CREATININE, GFRNONAA, GFRAA in the last 8760 hours.  Invalid input(s): CMP  LIVER FUNCTION TESTS: No results for input(s): BILITOT, AST, ALT, ALKPHOS, PROT, ALBUMIN in the last 8760 hours.  TUMOR MARKERS: No results for input(s): AFPTM, CEA, CA199, CHROMGRNA in  the last 8760 hours.  Assessment and Plan:  85 y/o F with low back pain following fall found to have L1 compression fracture who presents today for an L1 KP/VP with moderate sedation for symptom management.  Patient has been NPO since midnight, no current anticoagulation/antiplatelet medications. Afebrile, WBC 12.9, hgb 15.9, plt 275, INR 1.1. BMP and UA pending and will be reviewed prior to proceeding.  Risks and benefits of L1 kyphoplasty/vertebroplasty were discussed with the patient including, but  not limited to education regarding the natural healing process of compression fractures without intervention, bleeding, infection, cement migration which may cause spinal cord damage, paralysis, pulmonary embolism or even death.  This interventional procedure involves the use of X-rays and because of the nature of the planned procedure, it is possible that we will have prolonged use of X-ray fluoroscopy. Potential radiation risks to you include (but are not limited to) the following: - A slightly elevated risk for cancer  several years later in life. This risk is typically less than 0.5% percent. This risk is low in comparison to the normal incidence of human cancer, which is 33% for women and 50% for men according to the [place]. - Radiation induced injury can include skin redness, resembling a rash, tissue breakdown / ulcers and hair loss (which can be temporary or permanent).  The likelihood of either of these occurring depends on the difficulty of the procedure and whether you are sensitive to radiation due to previous procedures, disease, or genetic conditions.  IF your procedure requires a prolonged use of radiation, you will be notified and given written instructions for further action.  It is your responsibility to monitor the irradiated area for the 2 weeks following the procedure and to notify your physician if you are concerned that you have suffered a radiation induced injury.     All of the patient's questions were answered, patient is agreeable to proceed.  Consent signed and in chart.  Thank you for this interesting consult.  I greatly enjoyed meeting Betzy Gutterman and look forward to participating in their care.  A copy of this report was sent to the requesting provider on this date.  Electronically Signed: Joaquim Nam, PA-C 07/26/2020, 2:00 PM   I spent a total of  30 Minutes in face to face in clinical consultation, greater than 50% of which was counseling/coordinating care for L1 KP/VP.

## 2020-07-27 ENCOUNTER — Ambulatory Visit (HOSPITAL_COMMUNITY)
Admission: RE | Admit: 2020-07-27 | Discharge: 2020-07-27 | Disposition: A | Discharge status: Home / Self Care | Payer: Medicare Other | Source: Ambulatory Visit | Attending: Interventional Radiology | Admitting: Interventional Radiology

## 2020-07-27 ENCOUNTER — Other Ambulatory Visit: Payer: Self-pay

## 2020-07-27 DIAGNOSIS — M4856XA Collapsed vertebra, not elsewhere classified, lumbar region, initial encounter for fracture: Secondary | ICD-10-CM | POA: Diagnosis not present

## 2020-07-27 DIAGNOSIS — S32000S Wedge compression fracture of unspecified lumbar vertebra, sequela: Secondary | ICD-10-CM | POA: Diagnosis not present

## 2020-07-27 DIAGNOSIS — I10 Essential (primary) hypertension: Secondary | ICD-10-CM | POA: Diagnosis not present

## 2020-07-27 DIAGNOSIS — Z87891 Personal history of nicotine dependence: Secondary | ICD-10-CM | POA: Insufficient documentation

## 2020-07-27 DIAGNOSIS — Z882 Allergy status to sulfonamides status: Secondary | ICD-10-CM | POA: Insufficient documentation

## 2020-07-27 DIAGNOSIS — S32010A Wedge compression fracture of first lumbar vertebra, initial encounter for closed fracture: Secondary | ICD-10-CM | POA: Diagnosis not present

## 2020-07-27 DIAGNOSIS — W19XXXS Unspecified fall, sequela: Secondary | ICD-10-CM | POA: Diagnosis not present

## 2020-07-27 DIAGNOSIS — K219 Gastro-esophageal reflux disease without esophagitis: Secondary | ICD-10-CM | POA: Diagnosis not present

## 2020-07-27 HISTORY — PX: IR VERTEBROPLASTY LUMBAR BX INC UNI/BIL INC/INJECT/IMAGING: IMG5516

## 2020-07-27 LAB — BASIC METABOLIC PANEL
Anion gap: 12 (ref 5–15)
BUN: 21 mg/dL (ref 8–23)
CO2: 28 mmol/L (ref 22–32)
Calcium: 9.1 mg/dL (ref 8.9–10.3)
Chloride: 96 mmol/L — ABNORMAL LOW (ref 98–111)
Creatinine, Ser: 0.89 mg/dL (ref 0.44–1.00)
GFR, Estimated: 60 mL/min — ABNORMAL LOW (ref >60)
Glucose, Bld: 98 mg/dL (ref 70–99)
Potassium: 3.7 mmol/L (ref 3.5–5.1)
Sodium: 136 mmol/L (ref 135–145)

## 2020-07-27 LAB — URINALYSIS, COMPLETE (UACMP) WITH MICROSCOPIC
Bacteria, UA: NONE SEEN
Bilirubin Urine: NEGATIVE
Glucose, UA: NEGATIVE mg/dL
Hgb urine dipstick: NEGATIVE
Ketones, ur: 20 mg/dL — AB
Leukocytes,Ua: NEGATIVE
Nitrite: NEGATIVE
Protein, ur: NEGATIVE mg/dL
Specific Gravity, Urine: 1.023 (ref 1.005–1.030)
pH: 6 (ref 5.0–8.0)

## 2020-07-27 LAB — CBC
HCT: 48.4 % — ABNORMAL HIGH (ref 36.0–46.0)
Hemoglobin: 15.9 g/dL — ABNORMAL HIGH (ref 12.0–15.0)
MCH: 32.8 pg (ref 26.0–34.0)
MCHC: 32.9 g/dL (ref 30.0–36.0)
MCV: 99.8 fL (ref 80.0–100.0)
Platelets: 275 10*3/uL (ref 150–400)
RBC: 4.85 MIL/uL (ref 3.87–5.11)
RDW: 12.4 % (ref 11.5–15.5)
WBC: 12.9 10*3/uL — ABNORMAL HIGH (ref 4.0–10.5)
nRBC: 0 % (ref 0.0–0.2)

## 2020-07-27 LAB — PROTIME-INR
INR: 1.1 (ref 0.8–1.2)
Prothrombin Time: 13.6 seconds (ref 11.4–15.2)

## 2020-07-27 LAB — APTT: aPTT: 29 seconds (ref 24–36)

## 2020-07-27 MED ORDER — CEFAZOLIN SODIUM-DEXTROSE 2-4 GM/100ML-% IV SOLN
INTRAVENOUS | Status: AC
  Filled 2020-07-27: qty 100

## 2020-07-27 MED ORDER — FENTANYL CITRATE (PF) 100 MCG/2ML IJ SOLN
INTRAMUSCULAR | Status: DC | PRN
  Administered 2020-07-27 (×2): 25 ug via INTRAVENOUS

## 2020-07-27 MED ORDER — CEFAZOLIN SODIUM-DEXTROSE 2-4 GM/100ML-% IV SOLN
2.0000 g | Freq: Once | INTRAVENOUS | Status: AC
  Administered 2020-07-27: 2 g via INTRAVENOUS

## 2020-07-27 MED ORDER — SODIUM CHLORIDE 0.9 % IV SOLN: INTRAVENOUS | Status: AC

## 2020-07-27 MED ORDER — TOBRAMYCIN SULFATE 1.2 G IJ SOLR
INTRAMUSCULAR | Status: AC
  Filled 2020-07-27: qty 1.2

## 2020-07-27 MED ORDER — FENTANYL CITRATE (PF) 100 MCG/2ML IJ SOLN
INTRAMUSCULAR | Status: AC
  Filled 2020-07-27: qty 2

## 2020-07-27 MED ORDER — GELATIN ABSORBABLE 12-7 MM EX MISC
CUTANEOUS | Status: AC
  Filled 2020-07-27: qty 1

## 2020-07-27 MED ORDER — IOHEXOL 300 MG/ML  SOLN
50.0000 mL | Freq: Once | INTRAMUSCULAR | Status: AC | PRN
  Administered 2020-07-27: 5 mL

## 2020-07-27 MED ORDER — BUPIVACAINE HCL (PF) 0.25 % IJ SOLN
INTRAMUSCULAR | Status: DC | PRN
  Administered 2020-07-27: 20 mL

## 2020-07-27 MED ORDER — MIDAZOLAM HCL 2 MG/2ML IJ SOLN
INTRAMUSCULAR | Status: AC
  Filled 2020-07-27: qty 2

## 2020-07-27 MED ORDER — SODIUM CHLORIDE 0.9 % IV SOLN: INTRAVENOUS | Status: DC

## 2020-07-27 MED ORDER — MIDAZOLAM HCL 2 MG/2ML IJ SOLN
INTRAMUSCULAR | Status: DC | PRN
  Administered 2020-07-27 (×2): 1 mg via INTRAVENOUS

## 2020-07-27 MED ORDER — GELATIN ABSORBABLE 12-7 MM EX MISC
CUTANEOUS | Status: DC | PRN
  Administered 2020-07-27: 1 via TOPICAL

## 2020-07-27 MED ORDER — BUPIVACAINE HCL (PF) 0.5 % IJ SOLN
INTRAMUSCULAR | Status: AC
  Filled 2020-07-27: qty 30

## 2020-07-27 NOTE — Discharge Instructions (Signed)
1.No stooping,bending or lifting more tham 10 lbs for 2 weeks. 2.Use walker to ambulate for 2 weeks. 3.No driving for 2 weeks. 4.RTC PRN 2 weeks    Percutaneous Vertebroplasty, Care After This sheet gives you information about how to care for yourself after your procedure. Your doctor may also give you more specific instructions. If you have problems or questions, contact your doctor. Follow these instructions at home: Surgical cut (incision) care  Follow instructions from your doctor about how to take care of your cut from surgery. Make sure you: ? Wash your hands with soap and water before you change your bandage (dressing). If you cannot use soap and water, use hand sanitizer. ? Change your bandage as told by your doctor. ? Leave stitches (sutures), skin glue, or skin tape (adhesive) strips in place. They may need to stay in place for 2 weeks or longer. If tape strips get loose and curl up, you may trim the loose edges. Do not remove tape strips completely unless your doctor says it is okay.  Check your surgical cut area every day for signs of infection. Check for: ? Redness, swelling, or pain. ? Fluid or blood. ? Warmth. ? Pus or a bad smell.  Keep the bandage dry as told by your doctor. Do not shower or bathe until your doctor says it is okay. Managing pain, stiffness, and swelling   If directed, apply ice to the affected area: ? Put ice in a plastic bag. ? Place a towel between your skin and bag. ? Leave the ice on for 20 minutes, 2-3 times a day.  Rest for 24 hrs after the procedure or as told by your doctor. Activity  Slowly return to normal activities as told by your doctor.  Ask what type of stretching and strengthening exercises you should do.  Do not bend or lift anything greater than 10 lb (4.5 kg). Follow your doctor's instructions about bending and lifting. General instructions  Take over-the-counter and prescription medicines only as told by your doctor.  Do  not drive for 24 hours if you were given a medicine to help you relax (sedative).  To prevent or treat constipation while you are taking prescription pain medicine, your doctor may recommend that you: ? Drink enough fluid to keep your pee (urine) clear or pale yellow. ? Take over-the-counter or prescription medicines. ? Eat foods that are high in fiber, such as:  Fresh fruits.  Fresh vegetables.  Whole grains.  Beans. ? Limit foods that are high in fat and processed sugars, such as fried and sweet foods. ? Keep all follow-up visits as told by your doctor. This is important. Contact a doctor if:  You have redness, swelling, or pain around your cut.  You have fluid or blood coming from your cut.  Your cut feels warm to the touch.  You have pus or bad smell coming from your cut.  You have a fever.  You are sick to your stomach (nauseous) or throw up (vomit) for more than 24 hours.  Your back pain does not get better. Get help right away if:  You have very bad back pain that comes on all of a sudden.  You cannot control when you pee or poop (bowel movement).  You lose feeling (become numb) or have tingling in your legs or feet, or they become weak.  You have new tingling, numbness, or weakness in your legs or feet.  You have sudden weakness in your legs.  You  have pain that shoots down your legs.  You have chest pain.  You have trouble breathing.  You are short of breath.  You feel dizzy or you pass out (faint).  Your vision changes or you cannot talk as you normally do. Summary  Rest for 24 hrs after the procedure and return slowly to normal activities as told by your doctor.  Do not drive for 24 hours if you were given a medicine to help you relax (sedative).  Take over-the-counter and prescription medicines only as told by your doctor. This information is not intended to replace advice given to you by your health care provider. Make sure you discuss any  questions you have with your health care provider. Document Revised: 09/12/2017 Document Reviewed: 12/31/2016 Elsevier Patient Education  2020 [employer].

## 2020-07-27 NOTE — Procedures (Signed)
S/P L1 VP S.Augusta Hilbert MD 

## 2020-08-01 DIAGNOSIS — D692 Other nonthrombocytopenic purpura: Secondary | ICD-10-CM | POA: Diagnosis not present

## 2020-08-01 DIAGNOSIS — Z299 Encounter for prophylactic measures, unspecified: Secondary | ICD-10-CM | POA: Diagnosis not present

## 2020-08-01 DIAGNOSIS — S32010A Wedge compression fracture of first lumbar vertebra, initial encounter for closed fracture: Secondary | ICD-10-CM | POA: Diagnosis not present

## 2020-08-01 DIAGNOSIS — I10 Essential (primary) hypertension: Secondary | ICD-10-CM | POA: Diagnosis not present

## 2020-08-01 DIAGNOSIS — I272 Pulmonary hypertension, unspecified: Secondary | ICD-10-CM | POA: Diagnosis not present

## 2020-08-12 DIAGNOSIS — I10 Essential (primary) hypertension: Secondary | ICD-10-CM | POA: Diagnosis not present

## 2020-09-12 DIAGNOSIS — M545 Low back pain, unspecified: Secondary | ICD-10-CM | POA: Diagnosis not present

## 2020-09-12 DIAGNOSIS — S32010A Wedge compression fracture of first lumbar vertebra, initial encounter for closed fracture: Secondary | ICD-10-CM | POA: Diagnosis not present

## 2020-09-12 DIAGNOSIS — I10 Essential (primary) hypertension: Secondary | ICD-10-CM | POA: Diagnosis not present

## 2020-09-12 DIAGNOSIS — I739 Peripheral vascular disease, unspecified: Secondary | ICD-10-CM | POA: Diagnosis not present

## 2020-09-12 DIAGNOSIS — Z299 Encounter for prophylactic measures, unspecified: Secondary | ICD-10-CM | POA: Diagnosis not present

## 2020-09-19 DIAGNOSIS — Z299 Encounter for prophylactic measures, unspecified: Secondary | ICD-10-CM | POA: Diagnosis not present

## 2020-09-19 DIAGNOSIS — M545 Low back pain, unspecified: Secondary | ICD-10-CM | POA: Diagnosis not present

## 2020-09-19 DIAGNOSIS — I10 Essential (primary) hypertension: Secondary | ICD-10-CM | POA: Diagnosis not present

## 2020-09-19 DIAGNOSIS — M533 Sacrococcygeal disorders, not elsewhere classified: Secondary | ICD-10-CM | POA: Diagnosis not present

## 2020-09-21 DIAGNOSIS — Z7189 Other specified counseling: Secondary | ICD-10-CM | POA: Diagnosis not present

## 2020-09-21 DIAGNOSIS — Z79899 Other long term (current) drug therapy: Secondary | ICD-10-CM | POA: Diagnosis not present

## 2020-09-21 DIAGNOSIS — Z299 Encounter for prophylactic measures, unspecified: Secondary | ICD-10-CM | POA: Diagnosis not present

## 2020-09-21 DIAGNOSIS — Z0000 Encounter for general adult medical examination without abnormal findings: Secondary | ICD-10-CM | POA: Diagnosis not present

## 2020-09-21 DIAGNOSIS — E7800 Pure hypercholesterolemia, unspecified: Secondary | ICD-10-CM | POA: Diagnosis not present

## 2020-09-21 DIAGNOSIS — Z681 Body mass index (BMI) 19 or less, adult: Secondary | ICD-10-CM | POA: Diagnosis not present

## 2020-09-21 DIAGNOSIS — I10 Essential (primary) hypertension: Secondary | ICD-10-CM | POA: Diagnosis not present

## 2020-09-21 DIAGNOSIS — R5383 Other fatigue: Secondary | ICD-10-CM | POA: Diagnosis not present

## 2020-10-12 ENCOUNTER — Other Ambulatory Visit: Payer: Self-pay

## 2020-10-12 ENCOUNTER — Ambulatory Visit (INDEPENDENT_AMBULATORY_CARE_PROVIDER_SITE_OTHER): Payer: Medicare Other | Admitting: Orthopaedic Surgery

## 2020-10-12 ENCOUNTER — Ambulatory Visit: Payer: Self-pay

## 2020-10-12 ENCOUNTER — Encounter: Payer: Self-pay | Admitting: Orthopaedic Surgery

## 2020-10-12 VITALS — BP 117/74 | HR 59 | Ht 63.0 in | Wt 112.0 lb

## 2020-10-12 DIAGNOSIS — M545 Low back pain, unspecified: Secondary | ICD-10-CM | POA: Diagnosis not present

## 2020-10-12 DIAGNOSIS — I10 Essential (primary) hypertension: Secondary | ICD-10-CM | POA: Diagnosis not present

## 2020-10-15 NOTE — Progress Notes (Signed)
Office Visit Note   Patient: Morgan Sandoval           Date of Birth: 1935/07/07           MRN: [id number] Visit Date: 10/12/2020              Requested by: Ignatius Specking, MD [address] [place],  [place] [address] PCP: Ignatius Specking, MD   Assessment & Plan: Visit Diagnoses:  1. Acute bilateral low back pain, unspecified whether sciatica present     Plan: We will set patient up for new MRI scan.  She may have new compression fracture.  We discussed length of time compression fractures are usually symptomatic.  Office follow-up after repeat lumbar MRI scan.  Follow-Up Instructions: Follow-up after MRI.  Orders:  Orders Placed This Encounter  Procedures  . XR Lumbar Spine 2-3 Views  . MR Lumbar Spine w/o contrast   No orders of the defined types were placed in this encounter.     Procedures: No procedures performed   Clinical Data: No additional findings.   Subjective: Chief Complaint  Patient presents with  . Lower Back - Pain    HPI 86 year old female here with severe back pain 7 or 8 out of 10 post kyphoplasty ordered by Dr. Sherril Croon and performed by Dr. Nickola Major at L1.  Patient is using a walker.  Patient takes calcium and is on Fosamax currently.  Previously was on Evista.  Patient past history of cholelithiasis, diverticulosis, GERD, hypertension.  50% L1 compression fracture.  Possible osteoporosis.  Review of Systems all other systems are noncontributory to HPI.   Objective: Vital Signs: BP 117/74   Pulse (!) 59   Ht 5\' 3"  (1.6 m)   Wt 112 lb (50.8 kg)   BMI 19.84 kg/m   Physical Exam Constitutional:      Appearance: She is well-developed.  HENT:     Head: Normocephalic.     Right Ear: External ear normal.     Left Ear: External ear normal.  Eyes:     Pupils: Pupils are equal, round, and reactive to light.  Neck:     Thyroid: No thyromegaly.     Trachea: No tracheal deviation.  Cardiovascular:     Rate and Rhythm: Normal rate.  Pulmonary:      Effort: Pulmonary effort is normal.  Abdominal:     Palpations: Abdomen is soft.  Skin:    General: Skin is warm and dry.  Neurological:     Mental Status: She is alert and oriented to person, place, and time.  Psychiatric:        Mood and Affect: Mood and affect normal.        Behavior: Behavior normal.     Ortho Exam patient has increased pain with forward flexion.  She is amatory with a walker slow from sitting to standing. Specialty Comments:  No specialty comments available.  Imaging: No results found.   PMFS History: Patient Active Problem List   Diagnosis Date Noted  . Diarrhea of presumed infectious origin 04/29/2019  . Heme positive stool 04/29/2019   Past Medical History:  Diagnosis Date  . Cholelithiasis   . Chronic diarrhea   . Colon polyp   . Diverticulosis   . GERD (gastroesophageal reflux disease)    if she eats acidic foods  . Hypertension   . Mucoid cyst of joint    left index finger  . Osteoporosis     Family History  Problem Relation  Age of Onset  . Cancer - Colon Brother     Past Surgical History:  Procedure Laterality Date  . APPENDECTOMY    . BIOPSY  05/19/2019   Procedure: BIOPSY;  Surgeon: Malissa Hippo, MD;  [facility];  Service: Endoscopy;;  sigmoid colon  . CHOLECYSTECTOMY    . COLON SURGERY     Removed large polyps  . COLONOSCOPY    . COLONOSCOPY N/A 05/18/2015   Procedure: COLONOSCOPY;  Surgeon: Malissa Hippo, MD;  [facility];  Service: Endoscopy;  Laterality: N/A;  1030  . COLONOSCOPY N/A 05/19/2019   Procedure: COLONOSCOPY;  Surgeon: Malissa Hippo, MD;  [facility];  Service: Endoscopy;  Laterality: N/A;  8:30  . GANGLION CYST EXCISION Left 03/2019   the fore finger  . IR VERTEBROPLASTY LUMBAR BX INC UNI/BIL INC/INJECT/IMAGING  07/27/2020  . MASS EXCISION Left 03/02/2019   Procedure: EXCISION CYST, DEBRIDEMENT PROXIMAL INTERPHALANGEAL JOINT LEFT INDEX FINGER;  Surgeon: Cindee Salt, MD;   Location: [facility];  Service: Orthopedics;  Laterality: Left;  . RECTAL SURGERY  2000s   [facility]  . TONSILLECTOMY     Social History   Occupational History  . Not on file  Tobacco Use  . Smoking status: Former Games developer  . Smokeless tobacco: Never Used  Vaping Use  . Vaping Use: Never used  Substance and Sexual Activity  . Alcohol use: Yes    Comment: occ  . Drug use: No  . Sexual activity: Not on file

## 2020-10-23 DIAGNOSIS — M545 Low back pain, unspecified: Secondary | ICD-10-CM | POA: Diagnosis not present

## 2020-10-23 DIAGNOSIS — M48061 Spinal stenosis, lumbar region without neurogenic claudication: Secondary | ICD-10-CM | POA: Diagnosis not present

## 2020-10-23 DIAGNOSIS — Z9889 Other specified postprocedural states: Secondary | ICD-10-CM | POA: Diagnosis not present

## 2020-10-23 DIAGNOSIS — M9973 Connective tissue and disc stenosis of intervertebral foramina of lumbar region: Secondary | ICD-10-CM | POA: Diagnosis not present

## 2020-10-23 DIAGNOSIS — M4316 Spondylolisthesis, lumbar region: Secondary | ICD-10-CM | POA: Diagnosis not present

## 2020-10-23 DIAGNOSIS — M2548 Effusion, other site: Secondary | ICD-10-CM | POA: Diagnosis not present

## 2020-10-24 DIAGNOSIS — M859 Disorder of bone density and structure, unspecified: Secondary | ICD-10-CM | POA: Diagnosis not present

## 2020-10-24 DIAGNOSIS — Z79899 Other long term (current) drug therapy: Secondary | ICD-10-CM | POA: Diagnosis not present

## 2020-10-24 DIAGNOSIS — M818 Other osteoporosis without current pathological fracture: Secondary | ICD-10-CM | POA: Diagnosis not present

## 2020-10-26 ENCOUNTER — Encounter: Payer: Self-pay | Admitting: Orthopaedic Surgery

## 2020-10-26 ENCOUNTER — Ambulatory Visit (INDEPENDENT_AMBULATORY_CARE_PROVIDER_SITE_OTHER): Payer: Medicare Other | Admitting: Orthopaedic Surgery

## 2020-10-26 DIAGNOSIS — M545 Low back pain, unspecified: Secondary | ICD-10-CM | POA: Diagnosis not present

## 2020-10-26 NOTE — Progress Notes (Signed)
Office Visit Note   Patient: Morgan Sandoval           Date of Birth: 1935/08/09           MRN: [id number] Visit Date: 10/26/2020              Requested by: [name], MD [place],  [place] [address] PCP: [name], MD   Assessment & Plan: Visit Diagnoses:  1. Lumbar pain            Post kyphoplasty L1. 2.       L3-4 lumbar spinal stenosis stable.  Plan: Continue calcium, vitamin D, use of the walker to prevent falling. She'll ambulate as far she can stops at rest and then repeat. Recheck 5 weeks. We discussed she should get some improvement with time.  Follow-Up Instructions: Return in about 5 weeks (around 11/30/2020).   Orders:  No orders of the defined types were placed in this encounter.  No orders of the defined types were placed in this encounter.     Procedures: No procedures performed   Clinical Data: No additional findings.   Subjective: Chief Complaint  Patient presents with  . Lower Back - Follow-up, Pain    MRI lumbar review    HPI 86 year old female returns post vertebroplasty in November for October 50% L1 compression fracture. Patient is using a walker she gets relief with sitting states her pain is awful and severe when she stands after period of time. New MRI has been obtained and is available for review. This shows 4 mm retrolisthesis at L1-2. She has unchanged moderate to severe stenosis at L3-4 which was present on her scan before her kyphoplasty. She denied any neurogenic claudication symptoms or back pain symptoms prior to her October L1 fracture. Patient children adopted. Patient's mother had osteoporosis.  Review of Systems updated unchanged. Past smoking history.  . All other systems noncontributory to HPI.   Objective: Vital Signs: Ht 5\' 3"  (1.6 m)   Wt 111 lb (50.3 kg)   BMI 19.66 kg/m   Physical Exam Constitutional:      Appearance: She is well-developed.  HENT:     Head: Normocephalic.     Right Ear: External ear  normal.     Left Ear: External ear normal.  Eyes:     Pupils: Pupils are equal, round, and reactive to light.  Neck:     Thyroid: No thyromegaly.     Trachea: No tracheal deviation.  Cardiovascular:     Rate and Rhythm: Normal rate.  Pulmonary:     Effort: Pulmonary effort is normal.  Abdominal:     Palpations: Abdomen is soft.  Skin:    General: Skin is warm and dry.  Neurological:     Mental Status: She is alert and oriented to person, place, and time.  Psychiatric:        Mood and Affect: Mood and affect normal.        Behavior: Behavior normal.     Ortho Exam patient has intact hip flexors quads anterior tib gastrocsoleus. Negative logroll the hips.  Specialty Comments:  No specialty comments available.  Imaging: lumbar MRI 10/23/20 [facility] No results found.   IMPRESSION: Severe left foraminal narrowing at L1-2 is due to retrolisthesis and buckled cortex is new since the prior MRI. There are new small to moderate L1-2 facet joint effusions resulting in widening of the facets and new 0.4 cm retrolisthesis of L1 on L2.  The slight increase in retropulsion or bone off the superior endplate of L1 causing mild central canal stenosis.  No new compression fracture. The patient has undergone L1 kyphoplasty since the prior MRI.  No change in moderately severe central canal stenosis at L3-4.   Electronically Signed By: Inge Rise M.D. On: 10/23/2020 15:03   PMFS History: Patient Active Problem List   Diagnosis Date Noted  . Lumbar pain 10/26/2020  . Diarrhea of presumed infectious origin 04/29/2019  . Heme positive stool 04/29/2019   Past Medical History:  Diagnosis Date  . Cholelithiasis   . Chronic diarrhea   . Colon polyp   . Diverticulosis   . GERD (gastroesophageal reflux disease)    if she eats acidic foods  . Hypertension   . Mucoid cyst of joint    left index finger  . Osteoporosis     Family History  Problem Relation Age of Onset  . Cancer  - Colon Brother     Past Surgical History:  Procedure Laterality Date  . APPENDECTOMY    . BIOPSY  05/19/2019   Procedure: BIOPSY;  Surgeon: Rogene Houston, MD;  [facility];  Service: Endoscopy;;  sigmoid colon  . CHOLECYSTECTOMY    . COLON SURGERY     Removed large polyps  . COLONOSCOPY    . COLONOSCOPY N/A 05/18/2015   Procedure: COLONOSCOPY;  Surgeon: Rogene Houston, MD;  [facility];  Service: Endoscopy;  Laterality: N/A;  1030  . COLONOSCOPY N/A 05/19/2019   Procedure: COLONOSCOPY;  Surgeon: Rogene Houston, MD;  [facility];  Service: Endoscopy;  Laterality: N/A;  8:30  . GANGLION CYST EXCISION Left 03/2019   the fore finger  . IR VERTEBROPLASTY LUMBAR BX INC UNI/BIL INC/INJECT/IMAGING  07/27/2020  . MASS EXCISION Left 03/02/2019   Procedure: EXCISION CYST, DEBRIDEMENT PROXIMAL INTERPHALANGEAL JOINT LEFT INDEX FINGER;  Surgeon: Daryll Brod, MD;  Location: [place];  Service: Orthopedics;  Laterality: Left;  . [facility] SURGERY  [facility]  . TONSILLECTOMY     Social History   Occupational History  . Not on file  Tobacco Use  . Smoking status: Former Research scientist (life sciences)  . Smokeless tobacco: Never Used  Vaping Use  . Vaping Use: Never used  Substance and Sexual Activity  . Alcohol use: Yes    Comment: occ  . Drug use: No  . Sexual activity: Not on file

## 2020-11-13 DIAGNOSIS — I10 Essential (primary) hypertension: Secondary | ICD-10-CM | POA: Diagnosis not present

## 2020-11-30 ENCOUNTER — Encounter: Payer: Self-pay | Admitting: Orthopaedic Surgery

## 2020-11-30 ENCOUNTER — Ambulatory Visit (INDEPENDENT_AMBULATORY_CARE_PROVIDER_SITE_OTHER): Payer: Medicare Other | Admitting: Orthopaedic Surgery

## 2020-11-30 ENCOUNTER — Other Ambulatory Visit: Payer: Self-pay

## 2020-11-30 VITALS — Ht 65.0 in | Wt 110.0 lb

## 2020-11-30 DIAGNOSIS — M545 Low back pain, unspecified: Secondary | ICD-10-CM | POA: Diagnosis not present

## 2020-11-30 DIAGNOSIS — M48062 Spinal stenosis, lumbar region with neurogenic claudication: Secondary | ICD-10-CM | POA: Diagnosis not present

## 2020-11-30 DIAGNOSIS — M48061 Spinal stenosis, lumbar region without neurogenic claudication: Secondary | ICD-10-CM | POA: Insufficient documentation

## 2020-11-30 NOTE — Progress Notes (Signed)
Office Visit Note   Patient: Morgan Sandoval           Date of Birth: [birth year]           MRN: [id number] Visit Date: 11/30/2020              Requested by: [name], MD [place],  [place] [address] PCP: [name], MD   Assessment & Plan: Visit Diagnoses:  1. Lumbar pain   2. Spinal stenosis of lumbar region with neurogenic claudication     Plan: We discussed trying to increase p.o. intake to regain the weight that she is lost.  Continue use the walker for fall prevention.  We discussed correction of both areas of stenosis would be a significant undertaking requiring instrumentation multilevel procedure and probably be best done at a tertiary referral center or by a surgeon only does spine surgery.  Problems with loss of fixation with osteoporotic bone discussed in detail.  I will recheck her in 3 months and will obtain a single lateral lumbar x-ray on return.  She continues to get relief when she sits down.  We discussed that the area in the left groin is likely related to the compression fracture and may improve with time.  Previous MRI scan was reviewed in detail.  Follow-Up Instructions: Return in about 3 months (around 02/27/2021).   Orders:  No orders of the defined types were placed in this encounter.  No orders of the defined types were placed in this encounter.     Procedures: No procedures performed   Clinical Data: No additional findings.   Subjective: Chief Complaint  Patient presents with  . Lower Back - Pain, Follow-up    HPI 86 year old female returns with history of L1 compression fracture.  Stable persistent problems intermittently in the left groin at times severe.  She is taking calcium vitamin D walking with a walker.  She did have fairly severe stenosis at L3-4 prior to the fracture is not having claudication symptoms.  She now has some stenosis at the level of the fracture L1-2 as well as at the L3-4 level.  Patient lost her husband 3  weeks ago after several months of Alzheimer's disease progressed.  She had put him in a skilled Alzheimer's unit and originally had onset of back pain try to help lift her husband with a compression fracture.  She is lost weight her blood pressure has been elevated when she has pain.  She is trying to put the weight back on.  Review of Systems all other systems noncontributory to HPI.   Objective: Vital Signs: Ht 5\' 5"  (1.651 m)   Wt 110 lb (49.9 kg)   BMI 18.30 kg/m   Physical Exam Constitutional:      Appearance: She is well-developed.  HENT:     Head: Normocephalic.     Right Ear: External ear normal.     Left Ear: External ear normal.  Eyes:     Pupils: Pupils are equal, round, and reactive to light.  Neck:     Thyroid: No thyromegaly.     Trachea: No tracheal deviation.  Cardiovascular:     Rate and Rhythm: Normal rate.  Pulmonary:     Effort: Pulmonary effort is normal.  Abdominal:     Palpations: Abdomen is soft.  Skin:    General: Skin is warm and dry.  Neurological:     Mental Status: She is alert and oriented to person, place, and time.  Psychiatric:        Mood and Affect: Mood and affect normal.        Behavior: Behavior normal.     Ortho Exam patient has palpable pedal pulses.  No pitting edema lower extremities.  No venous stasis ulcerations.  Negative logroll the hips. Specialty Comments:  No specialty comments available.  Imaging: No results found.   PMFS History: Patient Active Problem List   Diagnosis Date Noted  . Spinal stenosis of lumbar region 11/30/2020  . Lumbar pain 10/26/2020  . Diarrhea of presumed infectious origin 04/29/2019  . Heme positive stool 04/29/2019   Past Medical History:  Diagnosis Date  . Cholelithiasis   . Chronic diarrhea   . Colon polyp   . Diverticulosis   . GERD (gastroesophageal reflux disease)    if she eats acidic foods  . Hypertension   . Mucoid cyst of joint    left index finger  . Osteoporosis      Family History  Problem Relation Age of Onset  . Cancer - Colon Brother     Past Surgical History:  Procedure Laterality Date  . APPENDECTOMY    . BIOPSY  05/19/2019   Procedure: BIOPSY;  Surgeon: Rogene Houston, MD;  [facility];  Service: Endoscopy;;  sigmoid colon  . CHOLECYSTECTOMY    . COLON SURGERY     Removed large polyps  . COLONOSCOPY    . COLONOSCOPY N/A 05/18/2015   Procedure: COLONOSCOPY;  Surgeon: Rogene Houston, MD;  [facility];  Service: Endoscopy;  Laterality: N/A;  1030  . COLONOSCOPY N/A 05/19/2019   Procedure: COLONOSCOPY;  Surgeon: Rogene Houston, MD;  [facility];  Service: Endoscopy;  Laterality: N/A;  8:30  . GANGLION CYST EXCISION Left 03/2019   the fore finger  . IR VERTEBROPLASTY LUMBAR BX INC UNI/BIL INC/INJECT/IMAGING  07/27/2020  . MASS EXCISION Left 03/02/2019   Procedure: EXCISION CYST, DEBRIDEMENT PROXIMAL INTERPHALANGEAL JOINT LEFT INDEX FINGER;  Surgeon: Daryll Brod, MD;  Location: [place];  Service: Orthopedics;  Laterality: Left;  . RE[facility]AL SURGERY  [facility]  . TONSILLECTOMY     Social History   Occupational History  . Not on file  Tobacco Use  . Smoking status: Former Research scientist (life sciences)  . Smokeless tobacco: Never Used  Vaping Use  . Vaping Use: Never used  Substance and Sexual Activity  . Alcohol use: Yes    Comment: occ  . Drug use: No  . Sexual activity: Not on file

## 2020-12-11 DIAGNOSIS — I10 Essential (primary) hypertension: Secondary | ICD-10-CM | POA: Diagnosis not present

## 2021-01-01 DIAGNOSIS — Z299 Encounter for prophylactic measures, unspecified: Secondary | ICD-10-CM | POA: Diagnosis not present

## 2021-01-01 DIAGNOSIS — I739 Peripheral vascular disease, unspecified: Secondary | ICD-10-CM | POA: Diagnosis not present

## 2021-01-01 DIAGNOSIS — M25552 Pain in left hip: Secondary | ICD-10-CM | POA: Diagnosis not present

## 2021-01-01 DIAGNOSIS — I10 Essential (primary) hypertension: Secondary | ICD-10-CM | POA: Diagnosis not present

## 2021-01-01 DIAGNOSIS — M1990 Unspecified osteoarthritis, unspecified site: Secondary | ICD-10-CM | POA: Diagnosis not present

## 2021-01-11 DIAGNOSIS — I10 Essential (primary) hypertension: Secondary | ICD-10-CM | POA: Diagnosis not present

## 2021-01-12 DIAGNOSIS — Z1231 Encounter for screening mammogram for malignant neoplasm of breast: Secondary | ICD-10-CM | POA: Diagnosis not present

## 2021-02-02 DIAGNOSIS — M25552 Pain in left hip: Secondary | ICD-10-CM | POA: Diagnosis not present

## 2021-02-02 DIAGNOSIS — M48061 Spinal stenosis, lumbar region without neurogenic claudication: Secondary | ICD-10-CM | POA: Diagnosis not present

## 2021-02-06 DIAGNOSIS — R2689 Other abnormalities of gait and mobility: Secondary | ICD-10-CM | POA: Diagnosis not present

## 2021-02-06 DIAGNOSIS — M6281 Muscle weakness (generalized): Secondary | ICD-10-CM | POA: Diagnosis not present

## 2021-02-06 DIAGNOSIS — M48061 Spinal stenosis, lumbar region without neurogenic claudication: Secondary | ICD-10-CM | POA: Diagnosis not present

## 2021-02-06 DIAGNOSIS — M25552 Pain in left hip: Secondary | ICD-10-CM | POA: Diagnosis not present

## 2021-02-09 DIAGNOSIS — M6281 Muscle weakness (generalized): Secondary | ICD-10-CM | POA: Diagnosis not present

## 2021-02-09 DIAGNOSIS — M25552 Pain in left hip: Secondary | ICD-10-CM | POA: Diagnosis not present

## 2021-02-09 DIAGNOSIS — M48061 Spinal stenosis, lumbar region without neurogenic claudication: Secondary | ICD-10-CM | POA: Diagnosis not present

## 2021-02-09 DIAGNOSIS — R2689 Other abnormalities of gait and mobility: Secondary | ICD-10-CM | POA: Diagnosis not present

## 2021-02-09 DIAGNOSIS — I10 Essential (primary) hypertension: Secondary | ICD-10-CM | POA: Diagnosis not present

## 2021-02-13 DIAGNOSIS — M25552 Pain in left hip: Secondary | ICD-10-CM | POA: Diagnosis not present

## 2021-02-13 DIAGNOSIS — R2689 Other abnormalities of gait and mobility: Secondary | ICD-10-CM | POA: Diagnosis not present

## 2021-02-13 DIAGNOSIS — M6281 Muscle weakness (generalized): Secondary | ICD-10-CM | POA: Diagnosis not present

## 2021-02-13 DIAGNOSIS — M48061 Spinal stenosis, lumbar region without neurogenic claudication: Secondary | ICD-10-CM | POA: Diagnosis not present

## 2021-02-15 DIAGNOSIS — M48061 Spinal stenosis, lumbar region without neurogenic claudication: Secondary | ICD-10-CM | POA: Diagnosis not present

## 2021-02-15 DIAGNOSIS — M6281 Muscle weakness (generalized): Secondary | ICD-10-CM | POA: Diagnosis not present

## 2021-02-15 DIAGNOSIS — R2689 Other abnormalities of gait and mobility: Secondary | ICD-10-CM | POA: Diagnosis not present

## 2021-02-15 DIAGNOSIS — M25552 Pain in left hip: Secondary | ICD-10-CM | POA: Diagnosis not present

## 2021-02-20 DIAGNOSIS — R2689 Other abnormalities of gait and mobility: Secondary | ICD-10-CM | POA: Diagnosis not present

## 2021-02-20 DIAGNOSIS — M25552 Pain in left hip: Secondary | ICD-10-CM | POA: Diagnosis not present

## 2021-02-20 DIAGNOSIS — M6281 Muscle weakness (generalized): Secondary | ICD-10-CM | POA: Diagnosis not present

## 2021-02-20 DIAGNOSIS — M48061 Spinal stenosis, lumbar region without neurogenic claudication: Secondary | ICD-10-CM | POA: Diagnosis not present

## 2021-02-22 DIAGNOSIS — R2689 Other abnormalities of gait and mobility: Secondary | ICD-10-CM | POA: Diagnosis not present

## 2021-02-22 DIAGNOSIS — M25552 Pain in left hip: Secondary | ICD-10-CM | POA: Diagnosis not present

## 2021-02-22 DIAGNOSIS — M48061 Spinal stenosis, lumbar region without neurogenic claudication: Secondary | ICD-10-CM | POA: Diagnosis not present

## 2021-02-22 DIAGNOSIS — M6281 Muscle weakness (generalized): Secondary | ICD-10-CM | POA: Diagnosis not present

## 2021-02-28 DIAGNOSIS — M48061 Spinal stenosis, lumbar region without neurogenic claudication: Secondary | ICD-10-CM | POA: Diagnosis not present

## 2021-02-28 DIAGNOSIS — M25552 Pain in left hip: Secondary | ICD-10-CM | POA: Diagnosis not present

## 2021-02-28 DIAGNOSIS — M6281 Muscle weakness (generalized): Secondary | ICD-10-CM | POA: Diagnosis not present

## 2021-02-28 DIAGNOSIS — R2689 Other abnormalities of gait and mobility: Secondary | ICD-10-CM | POA: Diagnosis not present

## 2021-03-02 DIAGNOSIS — R2689 Other abnormalities of gait and mobility: Secondary | ICD-10-CM | POA: Diagnosis not present

## 2021-03-02 DIAGNOSIS — M25552 Pain in left hip: Secondary | ICD-10-CM | POA: Diagnosis not present

## 2021-03-02 DIAGNOSIS — M6281 Muscle weakness (generalized): Secondary | ICD-10-CM | POA: Diagnosis not present

## 2021-03-02 DIAGNOSIS — M48061 Spinal stenosis, lumbar region without neurogenic claudication: Secondary | ICD-10-CM | POA: Diagnosis not present

## 2021-03-09 DIAGNOSIS — R2689 Other abnormalities of gait and mobility: Secondary | ICD-10-CM | POA: Diagnosis not present

## 2021-03-09 DIAGNOSIS — M48061 Spinal stenosis, lumbar region without neurogenic claudication: Secondary | ICD-10-CM | POA: Diagnosis not present

## 2021-03-09 DIAGNOSIS — M6281 Muscle weakness (generalized): Secondary | ICD-10-CM | POA: Diagnosis not present

## 2021-03-09 DIAGNOSIS — M25552 Pain in left hip: Secondary | ICD-10-CM | POA: Diagnosis not present

## 2021-03-13 DIAGNOSIS — I10 Essential (primary) hypertension: Secondary | ICD-10-CM | POA: Diagnosis not present

## 2021-03-14 DIAGNOSIS — M25552 Pain in left hip: Secondary | ICD-10-CM | POA: Diagnosis not present

## 2021-03-14 DIAGNOSIS — R2689 Other abnormalities of gait and mobility: Secondary | ICD-10-CM | POA: Diagnosis not present

## 2021-03-14 DIAGNOSIS — M6281 Muscle weakness (generalized): Secondary | ICD-10-CM | POA: Diagnosis not present

## 2021-03-14 DIAGNOSIS — M48061 Spinal stenosis, lumbar region without neurogenic claudication: Secondary | ICD-10-CM | POA: Diagnosis not present

## 2021-03-16 DIAGNOSIS — M48062 Spinal stenosis, lumbar region with neurogenic claudication: Secondary | ICD-10-CM | POA: Diagnosis not present

## 2021-03-20 DIAGNOSIS — M48061 Spinal stenosis, lumbar region without neurogenic claudication: Secondary | ICD-10-CM | POA: Diagnosis not present

## 2021-03-20 DIAGNOSIS — M25552 Pain in left hip: Secondary | ICD-10-CM | POA: Diagnosis not present

## 2021-03-20 DIAGNOSIS — M6281 Muscle weakness (generalized): Secondary | ICD-10-CM | POA: Diagnosis not present

## 2021-03-20 DIAGNOSIS — R2689 Other abnormalities of gait and mobility: Secondary | ICD-10-CM | POA: Diagnosis not present

## 2021-03-23 DIAGNOSIS — M6281 Muscle weakness (generalized): Secondary | ICD-10-CM | POA: Diagnosis not present

## 2021-03-23 DIAGNOSIS — M48061 Spinal stenosis, lumbar region without neurogenic claudication: Secondary | ICD-10-CM | POA: Diagnosis not present

## 2021-03-23 DIAGNOSIS — M25552 Pain in left hip: Secondary | ICD-10-CM | POA: Diagnosis not present

## 2021-03-23 DIAGNOSIS — R2689 Other abnormalities of gait and mobility: Secondary | ICD-10-CM | POA: Diagnosis not present

## 2021-03-27 DIAGNOSIS — R2689 Other abnormalities of gait and mobility: Secondary | ICD-10-CM | POA: Diagnosis not present

## 2021-03-27 DIAGNOSIS — M48061 Spinal stenosis, lumbar region without neurogenic claudication: Secondary | ICD-10-CM | POA: Diagnosis not present

## 2021-03-27 DIAGNOSIS — M6281 Muscle weakness (generalized): Secondary | ICD-10-CM | POA: Diagnosis not present

## 2021-03-27 DIAGNOSIS — M25552 Pain in left hip: Secondary | ICD-10-CM | POA: Diagnosis not present

## 2021-04-03 DIAGNOSIS — M25552 Pain in left hip: Secondary | ICD-10-CM | POA: Diagnosis not present

## 2021-04-03 DIAGNOSIS — M48061 Spinal stenosis, lumbar region without neurogenic claudication: Secondary | ICD-10-CM | POA: Diagnosis not present

## 2021-04-03 DIAGNOSIS — M6281 Muscle weakness (generalized): Secondary | ICD-10-CM | POA: Diagnosis not present

## 2021-04-03 DIAGNOSIS — R2689 Other abnormalities of gait and mobility: Secondary | ICD-10-CM | POA: Diagnosis not present

## 2021-04-12 DIAGNOSIS — I10 Essential (primary) hypertension: Secondary | ICD-10-CM | POA: Diagnosis not present

## 2021-05-10 DIAGNOSIS — I739 Peripheral vascular disease, unspecified: Secondary | ICD-10-CM | POA: Diagnosis not present

## 2021-05-10 DIAGNOSIS — Z681 Body mass index (BMI) 19 or less, adult: Secondary | ICD-10-CM | POA: Diagnosis not present

## 2021-05-10 DIAGNOSIS — I10 Essential (primary) hypertension: Secondary | ICD-10-CM | POA: Diagnosis not present

## 2021-05-10 DIAGNOSIS — Z299 Encounter for prophylactic measures, unspecified: Secondary | ICD-10-CM | POA: Diagnosis not present

## 2021-05-10 DIAGNOSIS — R1310 Dysphagia, unspecified: Secondary | ICD-10-CM | POA: Diagnosis not present

## 2021-05-22 DIAGNOSIS — K219 Gastro-esophageal reflux disease without esophagitis: Secondary | ICD-10-CM | POA: Diagnosis not present

## 2021-05-22 DIAGNOSIS — R1310 Dysphagia, unspecified: Secondary | ICD-10-CM | POA: Diagnosis not present

## 2021-05-27 ENCOUNTER — Inpatient Hospital Stay
Admission: EM | Admit: 2021-05-27 | Discharge: 2021-05-30 | DRG: 871 | Disposition: A | Discharge status: Home / Self Care | Payer: Medicare Other | Attending: Internal Medicine | Admitting: Internal Medicine

## 2021-05-27 ENCOUNTER — Emergency Department: Payer: Medicare Other

## 2021-05-27 ENCOUNTER — Other Ambulatory Visit: Payer: Self-pay

## 2021-05-27 DIAGNOSIS — A419 Sepsis, unspecified organism: Principal | ICD-10-CM

## 2021-05-27 DIAGNOSIS — Z79899 Other long term (current) drug therapy: Secondary | ICD-10-CM

## 2021-05-27 DIAGNOSIS — Z87891 Personal history of nicotine dependence: Secondary | ICD-10-CM | POA: Diagnosis not present

## 2021-05-27 DIAGNOSIS — Z7983 Long term (current) use of bisphosphonates: Secondary | ICD-10-CM

## 2021-05-27 DIAGNOSIS — J439 Emphysema, unspecified: Secondary | ICD-10-CM | POA: Diagnosis not present

## 2021-05-27 DIAGNOSIS — J479 Bronchiectasis, uncomplicated: Secondary | ICD-10-CM | POA: Diagnosis not present

## 2021-05-27 DIAGNOSIS — J4489 Other specified chronic obstructive pulmonary disease: Secondary | ICD-10-CM

## 2021-05-27 DIAGNOSIS — M810 Age-related osteoporosis without current pathological fracture: Secondary | ICD-10-CM | POA: Diagnosis present

## 2021-05-27 DIAGNOSIS — R0600 Dyspnea, unspecified: Secondary | ICD-10-CM | POA: Diagnosis not present

## 2021-05-27 DIAGNOSIS — J449 Chronic obstructive pulmonary disease, unspecified: Secondary | ICD-10-CM

## 2021-05-27 DIAGNOSIS — Z20822 Contact with and (suspected) exposure to covid-19: Secondary | ICD-10-CM | POA: Diagnosis present

## 2021-05-27 DIAGNOSIS — J159 Unspecified bacterial pneumonia: Secondary | ICD-10-CM | POA: Diagnosis present

## 2021-05-27 DIAGNOSIS — R059 Cough, unspecified: Secondary | ICD-10-CM

## 2021-05-27 DIAGNOSIS — J189 Pneumonia, unspecified organism: Secondary | ICD-10-CM | POA: Diagnosis present

## 2021-05-27 DIAGNOSIS — J9601 Acute respiratory failure with hypoxia: Secondary | ICD-10-CM | POA: Diagnosis not present

## 2021-05-27 DIAGNOSIS — R0602 Shortness of breath: Secondary | ICD-10-CM | POA: Diagnosis not present

## 2021-05-27 DIAGNOSIS — K219 Gastro-esophageal reflux disease without esophagitis: Secondary | ICD-10-CM | POA: Diagnosis present

## 2021-05-27 DIAGNOSIS — I10 Essential (primary) hypertension: Secondary | ICD-10-CM | POA: Diagnosis present

## 2021-05-27 DIAGNOSIS — J8410 Pulmonary fibrosis, unspecified: Secondary | ICD-10-CM | POA: Diagnosis present

## 2021-05-27 DIAGNOSIS — D72828 Other elevated white blood cell count: Secondary | ICD-10-CM

## 2021-05-27 DIAGNOSIS — D72829 Elevated white blood cell count, unspecified: Secondary | ICD-10-CM | POA: Diagnosis present

## 2021-05-27 LAB — CBC
HCT: 43.6 % (ref 36.0–46.0)
Hemoglobin: 15.3 g/dL — ABNORMAL HIGH (ref 12.0–15.0)
MCH: 33.3 pg (ref 26.0–34.0)
MCHC: 35.1 g/dL (ref 30.0–36.0)
MCV: 94.8 fL (ref 80.0–100.0)
Platelets: 260 10*3/uL (ref 150–400)
RBC: 4.6 MIL/uL (ref 3.87–5.11)
RDW: 11.9 % (ref 11.5–15.5)
WBC: 26.6 10*3/uL — ABNORMAL HIGH (ref 4.0–10.5)
nRBC: 0 % (ref 0.0–0.2)

## 2021-05-27 LAB — PROCALCITONIN: Procalcitonin: 0.7 ng/mL

## 2021-05-27 LAB — RESP PANEL BY RT-PCR (FLU A&B, COVID) ARPGX2
Influenza A by PCR: NEGATIVE
Influenza B by PCR: NEGATIVE
SARS Coronavirus 2 by RT PCR: NEGATIVE

## 2021-05-27 LAB — TROPONIN I (HIGH SENSITIVITY)
Troponin I (High Sensitivity): 11 ng/L (ref <18)
Troponin I (High Sensitivity): 11 ng/L (ref <18)

## 2021-05-27 LAB — BASIC METABOLIC PANEL
Anion gap: 11 (ref 5–15)
BUN: 17 mg/dL (ref 8–23)
CO2: 24 mmol/L (ref 22–32)
Calcium: 8.6 mg/dL — ABNORMAL LOW (ref 8.9–10.3)
Chloride: 97 mmol/L — ABNORMAL LOW (ref 98–111)
Creatinine, Ser: 1.01 mg/dL — ABNORMAL HIGH (ref 0.44–1.00)
GFR, Estimated: 55 mL/min — ABNORMAL LOW (ref >60)
Glucose, Bld: 141 mg/dL — ABNORMAL HIGH (ref 70–99)
Potassium: 3.8 mmol/L (ref 3.5–5.1)
Sodium: 132 mmol/L — ABNORMAL LOW (ref 135–145)

## 2021-05-27 LAB — LACTIC ACID, PLASMA
Lactic Acid, Venous: 2 mmol/L (ref 0.5–1.9)
Lactic Acid, Venous: 1.4 mmol/L (ref 0.5–1.9)

## 2021-05-27 MED ORDER — SPIRONOLACTONE 25 MG PO TABS
25.0000 mg | ORAL_TABLET | Freq: Every day | ORAL | Status: DC
  Administered 2021-05-27 – 2021-05-30 (×4): 25 mg via ORAL
  Filled 2021-05-27 (×4): qty 1

## 2021-05-27 MED ORDER — PANTOPRAZOLE SODIUM 40 MG PO TBEC
40.0000 mg | DELAYED_RELEASE_TABLET | Freq: Every day | ORAL | Status: DC
  Administered 2021-05-28 – 2021-05-29 (×2): 40 mg via ORAL
  Filled 2021-05-27 (×2): qty 1

## 2021-05-27 MED ORDER — IPRATROPIUM-ALBUTEROL 0.5-2.5 (3) MG/3ML IN SOLN
3.0000 mL | Freq: Once | RESPIRATORY_TRACT | Status: AC
  Administered 2021-05-27: 3 mL via RESPIRATORY_TRACT
  Filled 2021-05-27: qty 3

## 2021-05-27 MED ORDER — ACETAMINOPHEN 325 MG PO TABS: 650.0000 mg | ORAL_TABLET | Freq: Four times a day (QID) | ORAL | Status: DC | PRN

## 2021-05-27 MED ORDER — ENOXAPARIN SODIUM 40 MG/0.4ML IJ SOSY
40.0000 mg | PREFILLED_SYRINGE | INTRAMUSCULAR | Status: DC
  Administered 2021-05-27 – 2021-05-29 (×3): 40 mg via SUBCUTANEOUS
  Filled 2021-05-27 (×4): qty 0.4

## 2021-05-27 MED ORDER — ACETAMINOPHEN 650 MG RE SUPP: 650.0000 mg | Freq: Four times a day (QID) | RECTAL | Status: DC | PRN

## 2021-05-27 MED ORDER — SODIUM CHLORIDE 0.9 % IV SOLN
500.0000 mg | Freq: Once | INTRAVENOUS | Status: AC
  Administered 2021-05-27: 500 mg via INTRAVENOUS
  Filled 2021-05-27: qty 500

## 2021-05-27 MED ORDER — SODIUM CHLORIDE 0.9 % IV SOLN
1.0000 g | Freq: Once | INTRAVENOUS | Status: AC
  Administered 2021-05-27: 1 g via INTRAVENOUS
  Filled 2021-05-27: qty 10

## 2021-05-27 MED ORDER — IPRATROPIUM-ALBUTEROL 0.5-2.5 (3) MG/3ML IN SOLN
3.0000 mL | Freq: Three times a day (TID) | RESPIRATORY_TRACT | Status: DC
  Administered 2021-05-27 – 2021-05-30 (×7): 3 mL via RESPIRATORY_TRACT
  Filled 2021-05-27 (×8): qty 3

## 2021-05-27 MED ORDER — GUAIFENESIN-DM 100-10 MG/5ML PO SYRP
5.0000 mL | ORAL_SOLUTION | ORAL | Status: DC | PRN
  Administered 2021-05-27 – 2021-05-29 (×4): 5 mL via ORAL
  Filled 2021-05-27 (×4): qty 5

## 2021-05-27 MED ORDER — ADULT MULTIVITAMIN W/MINERALS CH
1.0000 | ORAL_TABLET | Freq: Every day | ORAL | Status: DC
  Administered 2021-05-27 – 2021-05-28 (×2): 1 via ORAL
  Filled 2021-05-27 (×4): qty 1

## 2021-05-27 MED ORDER — VITAMIN B-12 1000 MCG PO TABS
5000.0000 ug | ORAL_TABLET | Freq: Every day | ORAL | Status: DC
  Administered 2021-05-27: 5000 ug via ORAL
  Filled 2021-05-27 (×4): qty 5

## 2021-05-27 MED ORDER — METHYLPREDNISOLONE SODIUM SUCC 40 MG IJ SOLR
40.0000 mg | Freq: Two times a day (BID) | INTRAMUSCULAR | Status: DC
  Administered 2021-05-28: 40 mg via INTRAVENOUS
  Filled 2021-05-27: qty 1

## 2021-05-27 MED ORDER — VITAMIN D 25 MCG (1000 UNIT) PO TABS
1000.0000 [IU] | ORAL_TABLET | Freq: Every day | ORAL | Status: DC
  Administered 2021-05-27 – 2021-05-30 (×4): 1000 [IU] via ORAL
  Filled 2021-05-27 (×4): qty 1

## 2021-05-27 MED ORDER — NEBIVOLOL HCL 10 MG PO TABS
10.0000 mg | ORAL_TABLET | Freq: Every day | ORAL | Status: DC
  Administered 2021-05-27 – 2021-05-30 (×4): 10 mg via ORAL
  Filled 2021-05-27 (×4): qty 1

## 2021-05-27 MED ORDER — SODIUM CHLORIDE 0.9 % IV SOLN
500.0000 mg | INTRAVENOUS | Status: AC
  Administered 2021-05-28 – 2021-05-29 (×2): 500 mg via INTRAVENOUS
  Filled 2021-05-27 (×2): qty 500

## 2021-05-27 MED ORDER — ONDANSETRON HCL 4 MG/2ML IJ SOLN: 4.0000 mg | Freq: Four times a day (QID) | INTRAMUSCULAR | Status: DC | PRN

## 2021-05-27 MED ORDER — CEFTRIAXONE SODIUM 1 G IJ SOLR
1.0000 g | INTRAMUSCULAR | Status: AC
Start: 2021-05-28 — End: 2021-05-30
  Administered 2021-05-28 – 2021-05-29 (×2): 1 g via INTRAVENOUS
  Filled 2021-05-27: qty 10
  Filled 2021-05-27: qty 1

## 2021-05-27 MED ORDER — ONDANSETRON HCL 4 MG PO TABS: 4.0000 mg | ORAL_TABLET | Freq: Four times a day (QID) | ORAL | Status: DC | PRN

## 2021-05-27 MED ORDER — METHYLPREDNISOLONE SODIUM SUCC 125 MG IJ SOLR
125.0000 mg | Freq: Once | INTRAMUSCULAR | Status: AC
  Administered 2021-05-27: 125 mg via INTRAVENOUS
  Filled 2021-05-27: qty 2

## 2021-05-27 NOTE — ED Notes (Signed)
Taken to xray.

## 2021-05-27 NOTE — ED Triage Notes (Signed)
Pt arrived pov, ambulatory to triage. Pt reports cough times one week that increases at night. Pt states she has been taking mucinex without relief.

## 2021-05-27 NOTE — Progress Notes (Signed)
CODE SEPSIS - PHARMACY COMMUNICATION  **Broad Spectrum Antibiotics should be administered within 1 hour of Sepsis diagnosis**  Time Code Sepsis Called/Page Received: @ 1423  Antibiotics Ordered: Ceftriaxone and azithromycin   Time of 1st antibiotic administration: 1416  Additional action taken by pharmacy: N/A  If necessary, Name of Provider/Nurse Contacted: N/A  Pernell Dupre, PharmD, [place] Pharmacist 05/27/2021 2:24 PM

## 2021-05-27 NOTE — Progress Notes (Signed)
Patient moved from [place], [place] two weeks ago and does not currently have a PCP.

## 2021-05-27 NOTE — H&P (Addendum)
History and Physical   Morgan Sandoval [id number] DOB: Mar 31, 1935 DOA: 05/27/2021  PCP: [name], MD  Patient coming from: [facility] via private vehicle  I have personally briefly reviewed patient's old medical records in [place].  Chief Concern: Shortness of breath and cough  HPI: Morgan Sandoval is a 86 y.o. female with medical history significant for COPD/emphysema, hypertension, presents to the emergency department for chief concerns of shortness of breath.  She reports a new cough that has been present for about 1 week.  She reports that the cough is productive and occasionally is white cotton like mucus and then green phlegm.  She also endorses shortness of breath that is worse with the cough.  She tried taking Mucinex at home and this did not improve her symptoms.  The Mucinex caused her to have nausea and therefore she discontinued Mucinex.  At bedside she was able to tell me her name, her age, and she knows she is in the hospital.  She denies fever, vomiting, diarrhea, chest pain, unintentional weight changes, syncope, loss of consciousness, dysuria, hematuria.  She reports that the DuoNeb treatment that was given in the emergency department improved her symptoms significantly.  She reports she was never diagnosed with COPD or emphysema.  She said that when she was little, she was diagnosed with pleurisy.  She states that she has been told she had bronchitis several times.  She formally resided in [facility] and recently moved to the area into the retirement home, at [facility].  Social history: She currently resides at [facility].  She is retired and formerly worked as a Education officer, museum, teaching third grade.  She has a remote history of smoking and at her peak she smoked half a pack per day.  She quit in 1980.  She endorses infrequent EtOH use and the last drink she had was about 2 weeks ago and it was 1 glass of wine.  She denies recreational drug  use.  Vaccination history: She is vaccinated for COVID-19, 3 doses of Moderna.  ROS: Constitutional: no weight change, no fever ENT/Mouth: no sore throat, no rhinorrhea Eyes: no eye pain, no vision changes Cardiovascular: no chest pain, no dyspnea,  no edema, no palpitations Respiratory: no cough, no sputum, no wheezing Gastrointestinal: no nausea, no vomiting, no diarrhea, no constipation Genitourinary: no urinary incontinence, no dysuria, no hematuria Musculoskeletal: no arthralgias, no myalgias Skin: no skin lesions, no pruritus, Neuro: + weakness, no loss of consciousness, no syncope Psych: no anxiety, no depression, + decrease appetite Heme/Lymph: no bruising, no bleeding  ED Course: Discussed with emergency medicine provider, patient requiring hospitalization for pneumonia.  Vitals in the emergency department was remarkable for temperature of 98, respiration rate of 16, heart rate 88, blood pressure 121/87, SPO2 of 93% on room air  Labs in the emergency department was remarkable for WBC 26.6, hemoglobin 15.3, platelets 260, potassium 3.8, chloride 97, sodium 132, bicarb 24, BUN 17, serum creatinine of 1.01, nonfasting blood glucose 141, troponin was 11.  COVID PCR was negative.  ED provider ordered azithromycin and ceftriaxone.  Assessment/Plan  Active Problems:   PNA (pneumonia)   History of tobacco use   COPD with chronic bronchitis (HCC)   Emphysema lung (HCC)   # Shortness of breath suspect secondary to COPD exacerbation in setting of pneumonia - Check lactic acid, blood cultures x2, procalcitonin to monitor baseline - DuoNebs 3 times daily ordered, Solu-Medrol 40 mg twice daily scheduled for  05/28/2021  # COPD exacerbation secondary to pneumonia-DuoNebs 3 times daily ordered, Solu-Medrol 125 mg IV once - Patient states she is never been told she had COPD and/or emphysema - I discussed with patient that upon discharge I would recommend her be referred to a  pulmonologist by her PCP for long-term management and follow-up for COPD/emphysema  # Leukocytosis secondary to pneumonia-CBC in a.m.  # History of hypertension-resumed nebivolol 10 mg daily, spironolactone 25 mg daily  # History of tobacco use-quit in 1980  Chart reviewed.   DVT prophylaxis: Enoxaparin 40 mg subcutaneous every 24 hours Code Status: Full code Diet: Heart healthy Family Communication: No family was updated per EDP Disposition Plan: Pending clinical course Consults called: None at this time Admission status: MedSurg, observation, telemetry  Past Medical History:  Diagnosis Date   Cholelithiasis    Chronic diarrhea    Colon polyp    Diverticulosis    GERD (gastroesophageal reflux disease)    if she eats acidic foods   Hypertension    Mucoid cyst of joint    left index finger   Osteoporosis    Past Surgical History:  Procedure Laterality Date   APPENDECTOMY     BIOPSY  05/19/2019   Procedure: BIOPSY;  Surgeon: Rogene Houston, MD;  [facility];  Service: Endoscopy;;  sigmoid colon   CHOLECYSTECTOMY     COLON SURGERY     Removed large polyps   COLONOSCOPY     COLONOSCOPY N/A 05/18/2015   Procedure: COLONOSCOPY;  Surgeon: Rogene Houston, MD;  [facility];  Service: Endoscopy;  Laterality: N/A;  1030   COLONOSCOPY N/A 05/19/2019   Procedure: COLONOSCOPY;  Surgeon: Rogene Houston, MD;  [facility];  Service: Endoscopy;  Laterality: N/A;  8:30   GANGLION CYST EXCISION Left 03/2019   the fore finger   IR VERTEBROPLASTY LUMBAR BX INC UNI/BIL INC/INJECT/IMAGING  07/27/2020   MASS EXCISION Left 03/02/2019   Procedure: EXCISION CYST, DEBRIDEMENT PROXIMAL INTERPHALANGEAL JOINT LEFT INDEX FINGER;  Surgeon: Daryll Brod, MD;  Location: [place];  Service: Orthopedics;  Laterality: Left;   RECTAL SURGERY  [facility]   TONSILLECTOMY     Social History:  reports that she has quit smoking. She has never used  smokeless tobacco. She reports current alcohol use. She reports that she does not use drugs.  Allergies  Allergen Reactions   Sulfa Antibiotics Rash   Family History  Problem Relation Age of Onset   Cancer - Colon Brother    Family history: Family history reviewed and not pertinent  Prior to Admission medications   Medication Sig Start Date End Date Taking? Authorizing Provider  alendronate (FOSAMAX) 70 MG tablet Take 70 mg by mouth once a week. 10/04/20   [provider]  Cyanocobalamin (B-12) 5000 MCG CAPS Take 5,000 mcg by mouth daily.    [provider]  diclofenac sodium (VOLTAREN) 1 % GEL Apply 1 application topically 2 (two) times daily.     [provider]  lisinopril (PRINIVIL,ZESTRIL) 5 MG tablet Take 5 mg by mouth daily.    [provider]  loperamide (IMODIUM) 2 MG capsule Take 1 capsule (2 mg total) by mouth 2 (two) times daily. 05/19/19   Rehman, Mechele Dawley, MD  nebivolol (BYSTOLIC) 10 MG tablet Take 10 mg by mouth daily.    [provider]  spironolactone (ALDACTONE) 25 MG tablet Take 25 mg by mouth daily.  [provider]   Physical Exam: Vitals:   05/27/21 1136 05/27/21 1137 05/27/21 1230 05/27/21 1400  BP: 121/87  (!) 133/58 123/68  Pulse: 88  77 83  Resp: 16  (!) 23 (!) 21  Temp: 98 F (36.7 C)     TempSrc: Oral     SpO2: 93%  95% 95%  Weight:  49.4 kg    Height:  '5\' 5"'$  (1.651 m)     Constitutional: appears age-appropriate, frail, NAD, calm, comfortable Eyes: PERRL, lids and conjunctivae normal ENMT: Mucous membranes are moist. Posterior pharynx clear of any exudate or lesions. Age-appropriate dentition. Hearing appropriate Neck: normal, supple, no masses, no thyromegaly Respiratory: Bilateral lower lobe crackles, L > R. Normal respiratory effort. No accessory muscle use.  Cardiovascular: Regular rate and rhythm, no murmurs / rubs / gallops. No extremity edema. 2+ pedal pulses. No carotid bruits.  Abdomen:  no tenderness, no masses palpated, no hepatosplenomegaly. Bowel sounds positive.  Musculoskeletal: no clubbing / cyanosis. No joint deformity upper and lower extremities. Good ROM, no contractures, no atrophy. Normal muscle tone.  Skin: no rashes, lesions, ulcers. No induration Neurologic: Sensation intact. Strength 5/5 in all 4.  Psychiatric: Normal judgment and insight. Alert and oriented x 3. Normal mood.   EKG: independently reviewed, showing sinus rhythm with rate of 81, QTc 451  Chest x-ray on Admission: I personally reviewed and I agree with radiologist reading as below.  DG Chest 2 View  Result Date: 05/27/2021 CLINICAL DATA:  Dyspnea EXAM: CHEST - 2 VIEW COMPARISON:  None. FINDINGS: The lungs are mildly, symmetrically hyperinflated in keeping with changes of underlying COPD. There is moderate central bronchiectasis noted. There is superimposed reticular infiltrate noted within the right upper lung zone peripherally and left mid and lower lung zone which may reflect changes of superimposed acute multifocal pneumonia in the appropriate clinical setting. No pneumothorax or pleural effusion. Cardiac size within normal limits. Pulmonary vascularity is normal. No acute bone abnormality. IMPRESSION: Multifocal pulmonary infiltrate, most in keeping with atypical infection in the appropriate clinical setting. Moderate central bronchiectasis. COPD. This could be better assessed with nonemergent CT examination, if indicated. Electronically Signed   By: Fidela Salisbury M.D.   On: 05/27/2021 12:38    Labs on Admission: I have personally reviewed following labs  CBC: Recent Labs  Lab 05/27/21 1250  WBC 26.6*  HGB 15.3*  HCT 43.6  MCV 94.8  PLT 123456   Basic Metabolic Panel: Recent Labs  Lab 05/27/21 1250  NA 132*  K 3.8  CL 97*  CO2 24  GLUCOSE 141*  BUN 17  CREATININE 1.01*  CALCIUM 8.6*   GFR: Estimated Creatinine Clearance: 32.3 mL/min (A) (by C-G formula based on SCr of 1.01 mg/dL  (H)).  Urine analysis:    Component Value Date/Time   COLORURINE YELLOW 07/27/2020 0820   APPEARANCEUR CLEAR 07/27/2020 0820   LABSPEC 1.023 07/27/2020 0820   PHURINE 6.0 07/27/2020 0820   GLUCOSEU NEGATIVE 07/27/2020 0820   HGBUR NEGATIVE 07/27/2020 0820   BILIRUBINUR NEGATIVE 07/27/2020 0820   KETONESUR 20 (A) 07/27/2020 0820   PROTEINUR NEGATIVE 07/27/2020 0820   NITRITE NEGATIVE 07/27/2020 0820   LEUKOCYTESUR NEGATIVE 07/27/2020 0820   Dr. Tobie Poet [facility]  If 7PM-7AM, please contact overnight-coverage provider If 7AM-7PM, please contact day coverage provider [email or web address]  05/27/2021, 2:25 PM

## 2021-05-27 NOTE — ED Provider Notes (Signed)
[facility] Emergency Department Provider Note ____________________________________________   Event Date/Time   First MD Initiated Contact with Patient 05/27/21 1200     (approximate)  I have reviewed the triage vital signs and the nursing notes.  HISTORY  Chief Complaint Cough   HPI Morgan Sandoval is a 86 y.o. femalewho presents to the ED for evaluation of cough.   Chart review indicates hypertension, GERD  Patient presents to the ED from home for evaluation of 1 week of persistent nonproductive cough.  She reports associated poor appetite and generalized weakness without documented fevers, chest pain, productive cough, abdominal pain, emesis, dysuria, diarrhea, syncopal episodes or falls.  Past Medical History:  Diagnosis Date   Cholelithiasis    Chronic diarrhea    Colon polyp    Diverticulosis    GERD (gastroesophageal reflux disease)    if she eats acidic foods   Hypertension    Mucoid cyst of joint    left index finger   Osteoporosis     Patient Active Problem List   Diagnosis Date Noted   PNA (pneumonia) 05/27/2021   History of tobacco use 05/27/2021   COPD with chronic bronchitis ([place]) 05/27/2021   Emphysema lung ([place]) 05/27/2021   Leukocytosis 05/27/2021   Spinal stenosis of lumbar region 11/30/2020   Lumbar pain 10/26/2020   Diarrhea of presumed infectious origin 04/29/2019   Heme positive stool 04/29/2019    Past Surgical History:  Procedure Laterality Date   APPENDECTOMY     BIOPSY  05/19/2019   Procedure: BIOPSY;  Surgeon: Rogene Houston, MD;  [facility];  Service: Endoscopy;;  sigmoid colon   CHOLECYSTECTOMY     COLON SURGERY     Removed large polyps   COLONOSCOPY     COLONOSCOPY N/A 05/18/2015   Procedure: COLONOSCOPY;  Surgeon: Rogene Houston, MD;  [facility]: [facility];  Service: Endoscopy;  Laterality: N/A;  1030   COLONOSCOPY N/A 05/19/2019   Procedure: COLONOSCOPY;  Surgeon: Rogene Houston, MD;   [facility]: [facility];  Service: Endoscopy;  Laterality: N/A;  8:30   GANGLION CYST EXCISION Left 03/2019   the fore finger   IR VERTEBROPLASTY LUMBAR BX INC UNI/BIL INC/INJECT/IMAGING  07/27/2020   MASS EXCISION Left 03/02/2019   Procedure: EXCISION CYST, DEBRIDEMENT PROXIMAL INTERPHALANGEAL JOINT LEFT INDEX FINGER;  Surgeon: Daryll Brod, MD;  Location: [facility];  Service: Orthopedics;  Laterality: Left;   RECTAL SURGERY  2000s   [facility]   TONSILLECTOMY      Prior to Admission medications   Medication Sig Start Date End Date Taking? Authorizing Provider  alendronate (FOSAMAX) 70 MG tablet Take 70 mg by mouth every Sunday.   Yes [provider]  cholecalciferol (VITAMIN D3) 25 MCG (1000 UNIT) tablet Take 1,000 Units by mouth daily.   Yes [provider]  Cyanocobalamin (B-12) 5000 MCG CAPS Take 5,000 mcg by mouth daily.   Yes [provider]  diclofenac sodium (VOLTAREN) 1 % GEL Apply 2-4 g topically 2 (two) times daily as needed (hip pain).   Yes [provider]  Multiple Vitamins-Minerals (MULTIVITAMIN WITH MINERALS) tablet Take 1 tablet by mouth daily.   Yes [provider]  nebivolol (BYSTOLIC) 10 MG tablet Take 10 mg by mouth daily.   Yes [provider]  pantoprazole (PROTONIX) 40 MG tablet Take 40 mg by mouth daily.   Yes [provider]  spironolactone (ALDACTONE) 25 MG tablet Take 25 mg  by mouth daily.   Yes [provider]    Allergies Sulfa antibiotics  Family History  Problem Relation Age of Onset   Cancer - Colon Brother     Social History Social History   Tobacco Use   Smoking status: Former   Smokeless tobacco: Never  Scientific laboratory technician Use: Never used  Substance Use Topics   Alcohol use: Yes    Comment: occ   Drug use: No    Review of Systems  Constitutional: No fever/chills.  Positive for generalized weakness Eyes: No visual changes. ENT: No sore  throat. Cardiovascular: Denies chest pain. Respiratory: Denies shortness of breath.  Positive for nonproductive cough Gastrointestinal: No abdominal pain.  No nausea, no vomiting.  No diarrhea.  No constipation. Positive for poor appetite Genitourinary: Negative for dysuria. Musculoskeletal: Negative for back pain. Skin: Negative for rash. Neurological: Negative for headaches, focal weakness or numbness.  ____________________________________________   PHYSICAL EXAM:  VITAL SIGNS: Vitals:   05/27/21 1430 05/27/21 1445  BP: 116/60   Pulse: 82 81  Resp: 20 20  Temp: 98.4 F (36.9 C)   SpO2: 96% 96%     Constitutional: Alert and oriented. Well appearing and in no acute distress.  Pleasant and conversational in full sentences.  No distress.  Patient with coughing fits during our conversation, Eyes: Conjunctivae are normal. PERRL. EOMI. Head: Atraumatic. Nose: No congestion/rhinnorhea. Mouth/Throat: Mucous membranes are moist.  Oropharynx non-erythematous. Neck: No stridor. No cervical spine tenderness to palpation. Cardiovascular: Normal rate, regular rhythm. Grossly normal heart sounds.  Good peripheral circulation. Respiratory: Normal respiratory effort.  No retractions.  Slightly prolonged expiratory phase, but good air movement throughout.  No wheezing.  Inspiratory crackles noted to the left base Gastrointestinal: Soft , nondistended, nontender to palpation. No CVA tenderness. Musculoskeletal: No lower extremity tenderness nor edema.  No joint effusions. No signs of acute trauma. Neurologic:  Normal speech and language. No gross focal neurologic deficits are appreciated. No gait instability noted. Skin:  Skin is warm, dry and intact. No rash noted. Psychiatric: Mood and affect are normal. Speech and behavior are normal.  ____________________________________________   LABS (all labs ordered are listed, but only abnormal results are displayed)  Labs Reviewed  BASIC  METABOLIC PANEL - Abnormal; Notable for the following components:      Result Value   Sodium 132 (*)    Chloride 97 (*)    Glucose, Bld 141 (*)    Creatinine, Ser 1.01 (*)    Calcium 8.6 (*)    GFR, Estimated 55 (*)    All other components within normal limits  CBC - Abnormal; Notable for the following components:   WBC 26.6 (*)    Hemoglobin 15.3 (*)    All other components within normal limits  LACTIC ACID, PLASMA - Abnormal; Notable for the following components:   Lactic Acid, Venous 2.0 (*)    All other components within normal limits  RESP PANEL BY RT-PCR (FLU A&B, COVID) ARPGX2  CULTURE, BLOOD (ROUTINE X 2)  CULTURE, BLOOD (ROUTINE X 2)  PROCALCITONIN  LACTIC ACID, PLASMA  TROPONIN I (HIGH SENSITIVITY)  TROPONIN I (HIGH SENSITIVITY)   ____________________________________________  12 Lead EKG  Sinus rhythm with a rate of 81 bpm.  Normal axis and intervals.  No evidence of acute ischemia. ____________________________________________  RADIOLOGY  ED MD interpretation: 2 view CXR reviewed by me with patchy multifocal infiltration  Official radiology report(s): DG Chest 2 View  Result Date: 05/27/2021 CLINICAL DATA:  Dyspnea EXAM: CHEST - 2 VIEW COMPARISON:  None. FINDINGS: The lungs are mildly, symmetrically hyperinflated in keeping with changes of underlying COPD. There is moderate central bronchiectasis noted. There is superimposed reticular infiltrate noted within the right upper lung zone peripherally and left mid and lower lung zone which may reflect changes of superimposed acute multifocal pneumonia in the appropriate clinical setting. No pneumothorax or pleural effusion. Cardiac size within normal limits. Pulmonary vascularity is normal. No acute bone abnormality. IMPRESSION: Multifocal pulmonary infiltrate, most in keeping with atypical infection in the appropriate clinical setting. Moderate central bronchiectasis. COPD. This could be better assessed with nonemergent CT  examination, if indicated. Electronically Signed   By: Fidela Salisbury M.D.   On: 05/27/2021 12:38    ____________________________________________   PROCEDURES and INTERVENTIONS  Procedure(s) performed (including Critical Care):  .1-3 Lead EKG Interpretation  Date/Time: 05/27/2021 3:42 PM Performed by: Vladimir Crofts, MD Authorized by: Vladimir Crofts, MD     Interpretation: normal     ECG rate:  88   ECG rate assessment: normal     Rhythm: sinus rhythm     Ectopy: none     Conduction: normal    Medications  azithromycin (ZITHROMAX) 500 mg in sodium chloride 0.9 % 250 mL IVPB (has no administration in time range)  enoxaparin (LOVENOX) injection 40 mg (has no administration in time range)  acetaminophen (TYLENOL) tablet 650 mg (has no administration in time range)    Or  acetaminophen (TYLENOL) suppository 650 mg (has no administration in time range)  ondansetron (ZOFRAN) tablet 4 mg (has no administration in time range)    Or  ondansetron (ZOFRAN) injection 4 mg (has no administration in time range)  ipratropium-albuterol (DUONEB) 0.5-2.5 (3) MG/3ML nebulizer solution 3 mL (has no administration in time range)  methylPREDNISolone sodium succinate (SOLU-MEDROL) 125 mg/2 mL injection 125 mg (has no administration in time range)  azithromycin (ZITHROMAX) 500 mg in sodium chloride 0.9 % 250 mL IVPB (has no administration in time range)  cefTRIAXone (ROCEPHIN) 1 g in sodium chloride 0.9 % 100 mL IVPB (has no administration in time range)  methylPREDNISolone sodium succinate (SOLU-MEDROL) 40 mg/mL injection 40 mg (has no administration in time range)  nebivolol (BYSTOLIC) tablet 10 mg (has no administration in time range)  spironolactone (ALDACTONE) tablet 25 mg (has no administration in time range)  pantoprazole (PROTONIX) EC tablet 40 mg (has no administration in time range)  B-12 CAPS 5,000 mcg (has no administration in time range)  cholecalciferol (VITAMIN D3) tablet 1,000 Units (has  no administration in time range)  multivitamin with minerals tablet 1 tablet (has no administration in time range)  ipratropium-albuterol (DUONEB) 0.5-2.5 (3) MG/3ML nebulizer solution 3 mL (3 mLs Nebulization Given 05/27/21 1253)  cefTRIAXone (ROCEPHIN) 1 g in sodium chloride 0.9 % 100 mL IVPB (0 g Intravenous Stopped 05/27/21 1455)    ____________________________________________   MDM / ED COURSE   86 year old woman with remote smoking history, possibly undiagnosed COPD, presents to the ED with a week of cough and evidence of multifocal bacterial pneumonia requiring medical admission.  Normal vitals on room air.  Exam with prolonged expiratory phase and decreased air movement throughout consistent with obstructive pathology and COPD considering her remote smoking history.  She otherwise looks okay and is conversational in full sentences.  Blood work with leukocytosis to 26,000.  No evidence of ACS or COVID-19.  We will start antibiotics for CAP and discussed with medicine for admission.  Clinical Course as of 05/27/21 1542  Sun May 27, 2021  1420 I speak with daughter, Warrick Parisian.  [DS]    Clinical Course User Index [DS] Vladimir Crofts, MD    ____________________________________________   FINAL CLINICAL IMPRESSION(S) / ED DIAGNOSES  Final diagnoses:  Multifocal pneumonia  Cough     ED Discharge Orders     None        Shantele Reller Tamala Julian   Note:  This document was prepared using Dragon voice recognition software and may include unintentional dictation errors.    Vladimir Crofts, MD 05/27/21 [phone]

## 2021-05-27 NOTE — ED Notes (Signed)
Set of blood cultures drawn at this time and lab at bedside to collect second set prior to starting antibiotics.

## 2021-05-27 NOTE — ED Notes (Signed)
Pt walked to car to get phone. Pt returned to waiting room and felt more SOB now. Vitals obtained, O2 at 88% RA. Pt now states SOB and some CP. Pt taken to room at this time and EKG to be performed by EDT Gabby.

## 2021-05-28 DIAGNOSIS — Z20822 Contact with and (suspected) exposure to covid-19: Secondary | ICD-10-CM | POA: Diagnosis present

## 2021-05-28 DIAGNOSIS — A419 Sepsis, unspecified organism: Secondary | ICD-10-CM | POA: Diagnosis not present

## 2021-05-28 DIAGNOSIS — J8410 Pulmonary fibrosis, unspecified: Secondary | ICD-10-CM | POA: Diagnosis present

## 2021-05-28 DIAGNOSIS — J479 Bronchiectasis, uncomplicated: Secondary | ICD-10-CM | POA: Diagnosis not present

## 2021-05-28 DIAGNOSIS — I700 Atherosclerosis of aorta: Secondary | ICD-10-CM | POA: Diagnosis not present

## 2021-05-28 DIAGNOSIS — Z87891 Personal history of nicotine dependence: Secondary | ICD-10-CM | POA: Diagnosis not present

## 2021-05-28 DIAGNOSIS — Z7983 Long term (current) use of bisphosphonates: Secondary | ICD-10-CM | POA: Diagnosis not present

## 2021-05-28 DIAGNOSIS — J439 Emphysema, unspecified: Secondary | ICD-10-CM | POA: Diagnosis not present

## 2021-05-28 DIAGNOSIS — R059 Cough, unspecified: Secondary | ICD-10-CM | POA: Diagnosis present

## 2021-05-28 DIAGNOSIS — J189 Pneumonia, unspecified organism: Secondary | ICD-10-CM | POA: Diagnosis not present

## 2021-05-28 DIAGNOSIS — M810 Age-related osteoporosis without current pathological fracture: Secondary | ICD-10-CM | POA: Diagnosis present

## 2021-05-28 DIAGNOSIS — I10 Essential (primary) hypertension: Secondary | ICD-10-CM | POA: Diagnosis present

## 2021-05-28 DIAGNOSIS — J159 Unspecified bacterial pneumonia: Secondary | ICD-10-CM | POA: Diagnosis present

## 2021-05-28 DIAGNOSIS — J9601 Acute respiratory failure with hypoxia: Secondary | ICD-10-CM | POA: Diagnosis not present

## 2021-05-28 DIAGNOSIS — K219 Gastro-esophageal reflux disease without esophagitis: Secondary | ICD-10-CM | POA: Diagnosis present

## 2021-05-28 DIAGNOSIS — Z79899 Other long term (current) drug therapy: Secondary | ICD-10-CM | POA: Diagnosis not present

## 2021-05-28 DIAGNOSIS — D72828 Other elevated white blood cell count: Secondary | ICD-10-CM | POA: Diagnosis not present

## 2021-05-28 DIAGNOSIS — J449 Chronic obstructive pulmonary disease, unspecified: Secondary | ICD-10-CM | POA: Diagnosis not present

## 2021-05-28 LAB — BASIC METABOLIC PANEL
Anion gap: 8 (ref 5–15)
BUN: 18 mg/dL (ref 8–23)
CO2: 26 mmol/L (ref 22–32)
Calcium: 8.3 mg/dL — ABNORMAL LOW (ref 8.9–10.3)
Chloride: 101 mmol/L (ref 98–111)
Creatinine, Ser: 0.87 mg/dL (ref 0.44–1.00)
GFR, Estimated: >60 mL/min (ref >60)
Glucose, Bld: 176 mg/dL — ABNORMAL HIGH (ref 70–99)
Potassium: 3.7 mmol/L (ref 3.5–5.1)
Sodium: 135 mmol/L (ref 135–145)

## 2021-05-28 LAB — CBC
HCT: 39.6 % (ref 36.0–46.0)
Hemoglobin: 13.9 g/dL (ref 12.0–15.0)
MCH: 32.6 pg (ref 26.0–34.0)
MCHC: 35.1 g/dL (ref 30.0–36.0)
MCV: 93 fL (ref 80.0–100.0)
Platelets: 274 10*3/uL (ref 150–400)
RBC: 4.26 MIL/uL (ref 3.87–5.11)
RDW: 12 % (ref 11.5–15.5)
WBC: 19.2 10*3/uL — ABNORMAL HIGH (ref 4.0–10.5)
nRBC: 0 % (ref 0.0–0.2)

## 2021-05-28 LAB — PROTIME-INR
INR: 1.3 — ABNORMAL HIGH (ref 0.8–1.2)
Prothrombin Time: 15.7 seconds — ABNORMAL HIGH (ref 11.4–15.2)

## 2021-05-28 LAB — CORTISOL-AM, BLOOD: Cortisol - AM: 10.6 ug/dL (ref 6.7–22.6)

## 2021-05-28 NOTE — Progress Notes (Signed)
[place] at [place] NAME: Morgan Sandoval    MR#:  [id number]  DATE OF BIRTH:  May 14, 1935  SUBJECTIVE:  Doing well. No fever Cough better  REVIEW OF SYSTEMS:   Review of Systems  Constitutional:  Negative for chills, fever and weight loss.  HENT:  Negative for ear discharge, ear pain and nosebleeds.   Eyes:  Negative for blurred vision, pain and discharge.  Respiratory:  Positive for cough, sputum production and shortness of breath. Negative for wheezing and stridor.   Cardiovascular:  Negative for chest pain, palpitations, orthopnea and PND.  Gastrointestinal:  Negative for abdominal pain, diarrhea, nausea and vomiting.  Genitourinary:  Negative for frequency and urgency.  Musculoskeletal:  Negative for back pain and joint pain.  Neurological:  Positive for weakness. Negative for sensory change, speech change and focal weakness.  Psychiatric/Behavioral:  Negative for depression and hallucinations. The patient is not nervous/anxious.   Tolerating Diet:yes Tolerating PT:   DRUG ALLERGIES:   Allergies  Allergen Reactions  . Sulfa Antibiotics Rash    VITALS:  Blood pressure 119/77, pulse (!) 124, temperature (!) 97.4 F (36.3 C), temperature source Oral, resp. rate 18, height '5\' 5"'$  (1.651 m), weight 49.4 kg, SpO2 96 %.  PHYSICAL EXAMINATION:   Physical Exam  GENERAL:  86 y.o.-year-old patient lying in the bed with no acute distress.  LUNGS: decreased breath sounds bilaterally, no wheezing, rales, rhonchi. No use of accessory muscles of respiration.  CARDIOVASCULAR: S1, S2 normal. No murmurs, rubs, or gallops.  ABDOMEN: Soft, nontender, nondistended. Bowel sounds present. No organomegaly or mass.  EXTREMITIES: No cyanosis, clubbing or edema b/l.    NEUROLOGIC: non focal PSYCHIATRIC:  patient is alert and oriented x 3.  SKIN: No obvious rash, lesion, or ulcer.   LABORATORY PANEL:  CBC Recent Labs  Lab 05/28/21 0520  WBC  19.2*  HGB 13.9  HCT 39.6  PLT 274    Chemistries  Recent Labs  Lab 05/28/21 0520  NA 135  K 3.7  CL 101  CO2 26  GLUCOSE 176*  BUN 18  CREATININE 0.87  CALCIUM 8.3*   Cardiac Enzymes No results for input(s): TROPONINI in the last 168 hours. RADIOLOGY:  DG Chest 2 View  Result Date: 05/27/2021 CLINICAL DATA:  Dyspnea EXAM: CHEST - 2 VIEW COMPARISON:  None. FINDINGS: The lungs are mildly, symmetrically hyperinflated in keeping with changes of underlying COPD. There is moderate central bronchiectasis noted. There is superimposed reticular infiltrate noted within the right upper lung zone peripherally and left mid and lower lung zone which may reflect changes of superimposed acute multifocal pneumonia in the appropriate clinical setting. No pneumothorax or pleural effusion. Cardiac size within normal limits. Pulmonary vascularity is normal. No acute bone abnormality. IMPRESSION: Multifocal pulmonary infiltrate, most in keeping with atypical infection in the appropriate clinical setting. Moderate central bronchiectasis. COPD. This could be better assessed with nonemergent CT examination, if indicated. Electronically Signed   By: Fidela Salisbury M.D.   On: 05/27/2021 12:38   ASSESSMENT AND PLAN:   KENNEDEE Sandoval is a 86 y.o. female with medical history significant for COPD/emphysema, hypertension, presents to the emergency department for chief concerns of shortness of breath.  She reports a new cough that has been present for about 1 week.  She reports that the cough is productive and occasionally is white cotton like mucus and then green phlegm.  She also endorses shortness of breath that is worse with the  cough.  Sepsis POA due to PNA Shortness of breath suspect secondary to COPD exacerbation in setting of pneumonia - lactic acid 2.0, leucocytosis, Abnormal CXR, Procalcitonin - DuoNebs 3 times daily ordered --Solu-Medrol 40 mg twice daily scheduled for 05/28/2021-- d/c since pt not  wheezing and clinically much improved   COPD exacerbation secondary to pneumonia ---DuoNebs 3 times daily ordered -- currently on room air.   Leukocytosis secondary to pneumonia- trending down  History of hypertension-resumed nebivolol, spironolactone   History of tobacco use-quit in 1980   Family communication :daughter Morgan Sandoval on phone Consults : CODE STATUS: Full DVT Prophylaxis :lovenox Level of care: Med-Surg Status is: Inpatient  Remains inpatient appropriate because:Inpatient level of care appropriate due to severity of illness  Dispo: The patient is from: Home              Anticipated d/c is to:  [facility]              Patient currently is not medically stable to d/c.   Difficult to place patient No        TOTAL TIME TAKING CARE OF THIS PATIENT: 25 minutes.  >50% time spent on counselling and coordination of care  Note: This dictation was prepared with Dragon dictation along with smaller phrase technology. Any transcriptional errors that result from this process are unintentional.  Fritzi Mandes M.D    [facility]   CC: Primary care physician; [name], MD Patient ID: Morgan Sandoval, female   DOB: July 11, 1935, 86 y.o.   MRN: [id number]

## 2021-05-29 ENCOUNTER — Inpatient Hospital Stay: Payer: Medicare Other

## 2021-05-29 DIAGNOSIS — J439 Emphysema, unspecified: Secondary | ICD-10-CM | POA: Diagnosis not present

## 2021-05-29 DIAGNOSIS — D72828 Other elevated white blood cell count: Secondary | ICD-10-CM | POA: Diagnosis not present

## 2021-05-29 DIAGNOSIS — A419 Sepsis, unspecified organism: Secondary | ICD-10-CM | POA: Diagnosis not present

## 2021-05-29 DIAGNOSIS — J189 Pneumonia, unspecified organism: Secondary | ICD-10-CM | POA: Diagnosis not present

## 2021-05-29 LAB — RESPIRATORY PANEL BY PCR

## 2021-05-29 LAB — CBC
HCT: 39.1 % (ref 36.0–46.0)
Hemoglobin: 13.8 g/dL (ref 12.0–15.0)
MCH: 33.4 pg (ref 26.0–34.0)
MCHC: 35.3 g/dL (ref 30.0–36.0)
MCV: 94.7 fL (ref 80.0–100.0)
Platelets: 319 10*3/uL (ref 150–400)
RBC: 4.13 MIL/uL (ref 3.87–5.11)
RDW: 12.2 % (ref 11.5–15.5)
WBC: 25.6 10*3/uL — ABNORMAL HIGH (ref 4.0–10.5)
nRBC: 0 % (ref 0.0–0.2)

## 2021-05-29 LAB — STREP PNEUMONIAE URINARY ANTIGEN: Strep Pneumo Urinary Antigen: NEGATIVE

## 2021-05-29 LAB — SEDIMENTATION RATE: Sed Rate: 39 mm/hr — ABNORMAL HIGH (ref 0–30)

## 2021-05-29 LAB — C-REACTIVE PROTEIN: CRP: 6.1 mg/dL — ABNORMAL HIGH (ref <1.0)

## 2021-05-29 MED ORDER — TRAZODONE HCL 50 MG PO TABS
25.0000 mg | ORAL_TABLET | Freq: Every evening | ORAL | Status: DC | PRN
  Administered 2021-05-29: 25 mg via ORAL
  Filled 2021-05-29: qty 1

## 2021-05-29 MED ORDER — PREDNISONE 20 MG PO TABS
20.0000 mg | ORAL_TABLET | Freq: Every day | ORAL | Status: DC
  Administered 2021-05-30: 20 mg via ORAL
  Filled 2021-05-29: qty 1

## 2021-05-29 MED ORDER — ENSURE ENLIVE PO LIQD
237.0000 mL | Freq: Two times a day (BID) | ORAL | Status: DC
  Administered 2021-05-29: 237 mL via ORAL

## 2021-05-29 MED ORDER — SODIUM CHLORIDE 0.9 % IV SOLN
1.0000 g | INTRAVENOUS | Status: DC
  Filled 2021-05-29: qty 10

## 2021-05-29 MED ORDER — BENZONATATE 100 MG PO CAPS
100.0000 mg | ORAL_CAPSULE | Freq: Three times a day (TID) | ORAL | Status: DC
  Administered 2021-05-29 – 2021-05-30 (×4): 100 mg via ORAL
  Filled 2021-05-29 (×4): qty 1

## 2021-05-29 MED ORDER — SODIUM CHLORIDE 0.9 % IV SOLN
INTRAVENOUS | Status: DC | PRN
Start: 2021-05-29 — End: 2021-05-30

## 2021-05-29 MED ORDER — GUAIFENESIN-DM 100-10 MG/5ML PO SYRP
10.0000 mL | ORAL_SOLUTION | Freq: Four times a day (QID) | ORAL | Status: DC
  Administered 2021-05-29 – 2021-05-30 (×4): 10 mL via ORAL
  Filled 2021-05-29 (×3): qty 10

## 2021-05-29 MED ORDER — PANTOPRAZOLE SODIUM 40 MG PO TBEC
40.0000 mg | DELAYED_RELEASE_TABLET | Freq: Two times a day (BID) | ORAL | Status: DC
  Administered 2021-05-29 – 2021-05-30 (×2): 40 mg via ORAL
  Filled 2021-05-29 (×2): qty 1

## 2021-05-29 MED ORDER — TRAZODONE HCL 50 MG PO TABS
25.0000 mg | ORAL_TABLET | Freq: Every day | ORAL | Status: DC
  Administered 2021-05-29: 25 mg via ORAL
  Filled 2021-05-29: qty 1

## 2021-05-29 NOTE — Progress Notes (Signed)
Mobility Specialist - Progress Note   05/29/21 1100  Mobility  Activity Ambulated in hall  Level of Assistance Independent  Assistive Device None  Distance Ambulated (ft) 640 ft  Mobility Ambulated independently in hallway  Mobility Response Tolerated well  Mobility performed by Mobility specialist  $Mobility charge 1 Mobility     Pt ambulated 2 laps in hallway independently, no AD. No LOB. O2 desat to 83% on RA, pt voiced SOB. PLB engaged. Pt returned to room for seated rest break, ~1-2 minutes for O2 to return > 88%. No s/s of distress. Pt agreeable to trial ambulation on portable O2 with sats back at 95% prior to activity. Pt ambulated an additional 2 laps in hallway, results below:  O2 while resting on RA = 94% O2 while AMB on RA = 83% O2 while AMB on 2L = 90% Pt returned to bed with needs in reach. Mild SOB. HR ranging 75-96 bpm. Denied chest pain/tightness.    Kathee Delton Mobility Specialist 05/29/21, 11:14 AM

## 2021-05-29 NOTE — Progress Notes (Signed)
[place] at [place] NAME: Morgan Sandoval    MR#:  [id number]  DATE OF BIRTH:  April 14, 1935  SUBJECTIVE:  patient continues to cough with some productive phlegm. No fever. She is unable to sleep well secondary to cough. Ambulating well in the hallways REVIEW OF SYSTEMS:   Review of Systems  Constitutional:  Negative for chills, fever and weight loss.  HENT:  Negative for ear discharge, ear pain and nosebleeds.   Eyes:  Negative for blurred vision, pain and discharge.  Respiratory:  Positive for cough, sputum production and shortness of breath. Negative for wheezing and stridor.   Cardiovascular:  Negative for chest pain, palpitations, orthopnea and PND.  Gastrointestinal:  Negative for abdominal pain, diarrhea, nausea and vomiting.  Genitourinary:  Negative for frequency and urgency.  Musculoskeletal:  Negative for back pain and joint pain.  Neurological:  Positive for weakness. Negative for sensory change, speech change and focal weakness.  Psychiatric/Behavioral:  Negative for depression and hallucinations. The patient is not nervous/anxious.   Tolerating Diet:yes Tolerating PT: self ambulatory  DRUG ALLERGIES:   Allergies  Allergen Reactions  . Sulfa Antibiotics Rash    VITALS:  Blood pressure (!) 120/56, pulse 70, temperature 97.8 F (36.6 C), temperature source Oral, resp. rate 20, height '5\' 5"'$  (1.651 m), weight 49.4 kg, SpO2 96 %.  PHYSICAL EXAMINATION:   Physical Exam  GENERAL:  86 y.o.-year-old patient lying in the bed with no acute distress.  LUNGS: decreased breath sounds bilaterally, no wheezing, rales, rhonchi. No use of accessory muscles of respiration.  CARDIOVASCULAR: S1, S2 normal. No murmurs, rubs, or gallops.  ABDOMEN: Soft, nontender, nondistended. Bowel sounds present. No organomegaly or mass.  EXTREMITIES: No cyanosis, clubbing or edema b/l.    NEUROLOGIC: non focal PSYCHIATRIC:  patient is alert and oriented  x 3.  SKIN: No obvious rash, lesion, or ulcer.   LABORATORY PANEL:  CBC Recent Labs  Lab 05/29/21 0931  WBC 25.6*  HGB 13.8  HCT 39.1  PLT 319     Chemistries  Recent Labs  Lab 05/28/21 0520  NA 135  K 3.7  CL 101  CO2 26  GLUCOSE 176*  BUN 18  CREATININE 0.87  CALCIUM 8.3*    Cardiac Enzymes No results for input(s): TROPONINI in the last 168 hours. RADIOLOGY:  CT CHEST WO CONTRAST  Result Date: 05/29/2021 CLINICAL DATA:  Cough, COPD exacerbation. EXAM: CT CHEST WITHOUT CONTRAST TECHNIQUE: Multidetector CT imaging of the chest was performed following the standard protocol without IV contrast. COMPARISON:  May 27, 2021. FINDINGS: Cardiovascular: Atherosclerosis of thoracic aorta is noted without aneurysm formation. Normal cardiac size. No pericardial effusion. Moderate coronary artery calcifications are noted. Mediastinum/Nodes: No significantly enlarged mediastinal or axillary lymph nodes. Thyroid gland, trachea, and esophagus demonstrate no significant findings. Lungs/Pleura: No pneumothorax or pleural effusion is noted. Peripheral honeycombing is noted in both lower lobes with associated bronchiectasis of both lower lobes. Peripheral reticular densities are noted in both upper lobes as well. These findings are most consistent with pulmonary fibrosis, although superimposed acute atypical inflammation cannot be excluded. Upper Abdomen: No acute abnormality. Musculoskeletal: Status post L1 kyphoplasty. No acute osseous abnormality is noted. IMPRESSION: Peripheral reticular densities are noted throughout both lungs with peripheral honeycombing and bronchiectasis seen in the lower lobes, most consistent with pulmonary fibrosis, although acute superimposed atypical inflammation cannot be excluded. Moderate coronary artery calcifications are noted suggesting coronary artery disease. Aortic Atherosclerosis (ICD10-I70.0). Electronically Signed  By: Marijo Conception M.D.   On: 05/29/2021  13:03   ASSESSMENT AND PLAN:   Morgan Sandoval is a 86 y.o. female with medical history significant for COPD/emphysema, hypertension, presents to the emergency department for chief concerns of shortness of breath.  She reports a new cough that has been present for about 1 week.  She reports that the cough is productive and occasionally is white cotton like mucus and then green phlegm.  She also endorses shortness of breath that is worse with the cough.  Sepsis POA due to PNA Shortness of breath suspect secondary to COPD exacerbation in setting of pneumonia Interstitial lung disease/pulmonary fibrosis - lactic acid 2.0, leucocytosis, Abnormal CXR, Procalcitonin - DuoNebs 3 times daily ordered --Solu-Medrol 40 mg twice daily scheduled for 05/28/2021-- d/c since pt not wheezing and clinically much improved -- CT chest shows interstitial lung fibrosis with bronchiectasis -- seen by pulmonary Dr Morgan Sandoval-- sputum has been sent for various test. Patient will follow-up with pulmonary as outpatient. -- started on prednisone 20 mg PO daily -- assess for home oxygen   COPD exacerbation secondary to pneumonia ---DuoNebs 3 times daily ordered -- currently on room air.   Leukocytosis secondary to pneumonia/steroids received in the ER  --came in with 26K--19K--25K --po steroids  History of hypertension -resumed nebivolol, spironolactone   History of tobacco use-quit in 1980   Family communication :daughter Morgan Sandoval on phone Consults : pulmonary CODE STATUS: Full DVT Prophylaxis :lovenox Level of care: Med-Surg Status is: Inpatient  Remains inpatient appropriate because:Inpatient level of care appropriate due to severity of illness  Dispo: The patient is from: Home              Anticipated d/c is to:  [facility]              Patient currently is not medically stable to d/c.   Difficult to place patient No        TOTAL TIME TAKING CARE OF THIS PATIENT: 25 minutes.   >50% time spent on counselling and coordination of care  Note: This dictation was prepared with Dragon dictation along with smaller phrase technology. Any transcriptional errors that result from this process are unintentional.  Morgan Sandoval M.D    [facility]   CC: Primary care physician; [name], MD Patient ID: Erling Conte, female   DOB: 08/02/1935, 86 y.o.   MRN: [id number]

## 2021-05-29 NOTE — Progress Notes (Signed)
Patient desated while ambulating. (See note from Kathee Delton, Mobility Tech 8/16 at 1105am). Order received from Dr Posey Pronto for oxygen

## 2021-05-29 NOTE — Consult Note (Signed)
Pulmonary Medicine          Date: 05/29/2021,   MRN# [id number] Morgan Sandoval [birth year]     AdmissionWeight: 49.4 kg                 CurrentWeight: 49.4 kg   Referring physician: Dr Posey Pronto   CHIEF COMPLAINT:   Acute hypoxemic respiratory failure   HISTORY OF PRESENT ILLNESS    Morgan Sandoval is a 86 y.o. female with medical history significant for smoking 20 years quit smoking 35 years ago,  hypertension, presents to the emergency department for chief concerns of shortness of breath.   She reports a new cough that has been present for about 1 week.  She reports that the cough is productive and occasionally is white cotton like mucus and then green phlegm.  She also endorses shortness of breath that is worse with the cough.  She tried taking Mucinex at home and this did not improve her symptoms.  The Mucinex caused her to have nausea and therefore she discontinued Mucinex.  At bedside she was able to tell me her name, her age, and she knows she is in the hospital.  She denies fever, vomiting, diarrhea, chest pain, unintentional weight changes, syncope, loss of consciousness, dysuria, hematuria.   She reports that the DuoNeb treatment that was given in the emergency department improved her symptoms significantly.   Patient reports having Pleurosy as a kid and it was severe where doctors did not know if she was going to survive. Patient shares she had recurrent bronchitis throughout her life.    PAST MEDICAL HISTORY   Past Medical History:  Diagnosis Date  . Cholelithiasis   . Chronic diarrhea   . Colon polyp   . Diverticulosis   . GERD (gastroesophageal reflux disease)    if she eats acidic foods  . Hypertension   . Mucoid cyst of joint    left index finger  . Osteoporosis      SURGICAL HISTORY   Past Surgical History:  Procedure Laterality Date  . APPENDECTOMY    . BIOPSY  05/19/2019   Procedure: BIOPSY;  Surgeon: Rogene Houston, MD;  [facility];  Service: Endoscopy;;  sigmoid colon  . CHOLECYSTECTOMY    . COLON SURGERY     Removed large polyps  . COLONOSCOPY    . COLONOSCOPY N/A 05/18/2015   Procedure: COLONOSCOPY;  Surgeon: Rogene Houston, MD;  [facility];  Service: Endoscopy;  Laterality: N/A;  1030  . COLONOSCOPY N/A 05/19/2019   Procedure: COLONOSCOPY;  Surgeon: Rogene Houston, MD;  [facility];  Service: Endoscopy;  Laterality: N/A;  8:30  . GANGLION CYST EXCISION Left 03/2019   the fore finger  . IR VERTEBROPLASTY LUMBAR BX INC UNI/BIL INC/INJECT/IMAGING  07/27/2020  . MASS EXCISION Left 03/02/2019   Procedure: EXCISION CYST, DEBRIDEMENT PROXIMAL INTERPHALANGEAL JOINT LEFT INDEX FINGER;  Surgeon: Daryll Brod, MD;  Location: [place];  Service: Orthopedics;  Laterality: Left;  . RECTAL SURGERY  2000s   [facility]  . TONSILLECTOMY       FAMILY HISTORY   Family History  Problem Relation Age of Onset  . Cancer - Colon Brother      SOCIAL HISTORY   Social History   Tobacco Use  . Smoking status: Former  . Smokeless tobacco: Never  Vaping Use  . Vaping Use: Never used  Substance Use Topics  . Alcohol  use: Yes    Comment: occ  . Drug use: No     MEDICATIONS    Home Medication:    Current Medication:  Current Facility-Administered Medications:  .  acetaminophen (TYLENOL) tablet 650 mg, 650 mg, Oral, Q6H PRN **OR** acetaminophen (TYLENOL) suppository 650 mg, 650 mg, Rectal, Q6H PRN, Cox, Amy N, DO .  azithromycin (ZITHROMAX) 500 mg in sodium chloride 0.9 % 250 mL IVPB, 500 mg, Intravenous, Q24H, Cox, Amy N, DO, Last Rate: 250 mL/hr at 05/28/21 1616, 500 mg at 05/28/21 1616 .  benzonatate (TESSALON) capsule 100 mg, 100 mg, Oral, TID, Fritzi Mandes, MD, 100 mg at 05/29/21 1054 .  cefTRIAXone (ROCEPHIN) 1 g in sodium chloride 0.9 % 100 mL IVPB, 1 g, Intravenous, Q24H, Cox, Amy N, DO, Last Rate: 200 mL/hr at 05/28/21 1343, 1 g at 05/28/21 1343 .   cholecalciferol (VITAMIN D3) tablet 1,000 Units, 1,000 Units, Oral, Daily, Cox, Amy N, DO, 1,000 Units at 05/29/21 0802 .  enoxaparin (LOVENOX) injection 40 mg, 40 mg, Subcutaneous, Q24H, Cox, Amy N, DO, 40 mg at 05/28/21 1527 .  feeding supplement (ENSURE ENLIVE / ENSURE PLUS) liquid 237 mL, 237 mL, Oral, BID BM, Fritzi Mandes, MD .  guaiFENesin-dextromethorphan (ROBITUSSIN DM) 100-10 MG/5ML syrup 10 mL, 10 mL, Oral, QID, Fritzi Mandes, MD, 10 mL at 05/29/21 1054 .  ipratropium-albuterol (DUONEB) 0.5-2.5 (3) MG/3ML nebulizer solution 3 mL, 3 mL, Nebulization, TID, Cox, Amy N, DO, 3 mL at 05/29/21 0734 .  multivitamin with minerals tablet 1 tablet, 1 tablet, Oral, Daily, Cox, Amy N, DO, 1 tablet at 05/28/21 0937 .  nebivolol (BYSTOLIC) tablet 10 mg, 10 mg, Oral, Daily, Cox, Amy N, DO, 10 mg at 05/29/21 0803 .  ondansetron (ZOFRAN) tablet 4 mg, 4 mg, Oral, Q6H PRN **OR** ondansetron (ZOFRAN) injection 4 mg, 4 mg, Intravenous, Q6H PRN, Cox, Amy N, DO .  pantoprazole (PROTONIX) EC tablet 40 mg, 40 mg, Oral, QAC breakfast, Cox, Amy N, DO, 40 mg at 05/29/21 0803 .  spironolactone (ALDACTONE) tablet 25 mg, 25 mg, Oral, Daily, Cox, Amy N, DO, 25 mg at 05/29/21 0803 .  traZODone (DESYREL) tablet 25 mg, 25 mg, Oral, QHS PRN, Mansy, Jan A, MD, 25 mg at 05/29/21 0116 .  vitamin B-12 (CYANOCOBALAMIN) tablet 5,000 mcg, 5,000 mcg, Oral, Daily, Cox, Amy N, DO, 5,000 mcg at 05/27/21 1646    ALLERGIES   Sulfa antibiotics     REVIEW OF SYSTEMS    Review of Systems:  Gen:  Denies  fever, sweats, chills weigh loss  HEENT: Denies blurred vision, double vision, ear pain, eye pain, hearing loss, nose bleeds, sore throat Cardiac:  No dizziness, chest pain or heaviness, chest tightness,edema Resp:   Denies cough or sputum porduction, shortness of breath,wheezing, hemoptysis,  Gi: Denies swallowing difficulty, stomach pain, nausea or vomiting, diarrhea, constipation, bowel incontinence Gu:  Denies bladder  incontinence, burning urine Ext:   Denies Joint pain, stiffness or swelling Skin: Denies  skin rash, easy bruising or bleeding or hives Endoc:  Denies polyuria, polydipsia , polyphagia or weight change Psych:   Denies depression, insomnia or hallucinations   Other:  All other systems negative   VS: BP (!) 120/56 (BP Location: Left Arm)   Pulse 70   Temp 97.8 F (36.6 C) (Oral)   Resp 20   Ht 5' 5"  (1.651 m)   Wt 49.4 kg   SpO2 96%   BMI 18.14 kg/m      PHYSICAL EXAM  GENERAL:NAD, no fevers, chills, no weakness no fatigue HEAD: Normocephalic, atraumatic.  EYES: Pupils equal, round, reactive to light. Extraocular muscles intact. No scleral icterus.  MOUTH: Moist mucosal membrane. Dentition intact. No abscess noted.  EAR, NOSE, THROAT: Clear without exudates. No external lesions.  NECK: Supple. No thyromegaly. No nodules. No JVD.  PULMONARY: mild  CARDIOVASCULAR: S1 and S2. Regular rate and rhythm. No murmurs, rubs, or gallops. No edema. Pedal pulses 2+ bilaterally.  GASTROINTESTINAL: Soft, nontender, nondistended. No masses. Positive bowel sounds. No hepatosplenomegaly.  MUSCULOSKELETAL: No swelling, clubbing, or edema. Range of motion full in all extremities.  NEUROLOGIC: Cranial nerves II through XII are intact. No gross focal neurological deficits. Sensation intact. Reflexes intact.  SKIN: No ulceration, lesions, rashes, or cyanosis. Skin warm and dry. Turgor intact.  PSYCHIATRIC: Mood, affect within normal limits. The patient is awake, alert and oriented x 3. Insight, judgment intact.       IMAGING    DG Chest 2 View  Result Date: 05/27/2021 CLINICAL DATA:  Dyspnea EXAM: CHEST - 2 VIEW COMPARISON:  None. FINDINGS: The lungs are mildly, symmetrically hyperinflated in keeping with changes of underlying COPD. There is moderate central bronchiectasis noted. There is superimposed reticular infiltrate noted within the right upper lung zone peripherally and left mid and  lower lung zone which may reflect changes of superimposed acute multifocal pneumonia in the appropriate clinical setting. No pneumothorax or pleural effusion. Cardiac size within normal limits. Pulmonary vascularity is normal. No acute bone abnormality. IMPRESSION: Multifocal pulmonary infiltrate, most in keeping with atypical infection in the appropriate clinical setting. Moderate central bronchiectasis. COPD. This could be better assessed with nonemergent CT examination, if indicated. Electronically Signed   By: Fidela Salisbury M.D.   On: 05/27/2021 12:38      ASSESSMENT/PLAN    Acute hypoxemic respiratory failure -appears to be due to acute exacerbation of idiopathic pulmonary fibrosis - empirically patient should have CAP therapy   -patient has severe GERD with s/p endoscopic esophageal dilation which can make GERD worse and exacerbate interstitial lung disease.  - present on admission  - COVID19 negative   - supplemental O2 during my evaluation -room air -Respiratory viral panel -serum fungitell -legionella ab -strep pneumoniae ur AG -Histoplasma Ur Ag -sputum resp cultures -AFB sputum expectorated specimen -sputum cytology  -reviewed pertinent imaging with patient today - ESR, CRP -PT/OT  -please encourage patient to use incentive spirometer few times each hour while hospitalized.         Thank you for allowing me to participate in the care of this patient.  Total face to face encounter time for this patient visit was 39mn. >50% of the time was  spent in counseling and coordination of care.   Patient/Family are satisfied with care plan and all questions have been answered.  This document was prepared using Dragon voice recognition software and may include unintentional dictation errors.     FOttie Glazier M.D.  [place]

## 2021-05-29 NOTE — Progress Notes (Signed)
Patient transported to CT 

## 2021-05-29 NOTE — Progress Notes (Signed)
Patient requesting ensure. Order received from Dr Posey Pronto for ensure

## 2021-05-30 DIAGNOSIS — J189 Pneumonia, unspecified organism: Secondary | ICD-10-CM | POA: Diagnosis not present

## 2021-05-30 LAB — ANA COMPREHENSIVE PANEL
Anti JO-1: <0.2 AI (ref 0.0–0.9)
Centromere Ab Screen: <0.2 AI (ref 0.0–0.9)
Chromatin Ab SerPl-aCnc: <0.2 AI (ref 0.0–0.9)
ENA SM Ab Ser-aCnc: <0.2 AI (ref 0.0–0.9)
Ribonucleic Protein: 1.1 AI — ABNORMAL HIGH (ref 0.0–0.9)
SSA (Ro) (ENA) Antibody, IgG: <0.2 AI (ref 0.0–0.9)
SSB (La) (ENA) Antibody, IgG: <0.2 AI (ref 0.0–0.9)
Scleroderma (Scl-70) (ENA) Antibody, IgG: <0.2 AI (ref 0.0–0.9)
ds DNA Ab: 19 IU/mL — ABNORMAL HIGH (ref 0–9)

## 2021-05-30 LAB — EXPECTORATED SPUTUM ASSESSMENT W GRAM STAIN, RFLX TO RESP C

## 2021-05-30 LAB — ANCA TITERS
Atypical P-ANCA titer: <1:20 {titer}
C-ANCA: <1:20 {titer}
P-ANCA: <1:20 {titer}

## 2021-05-30 MED ORDER — CEFDINIR 300 MG PO CAPS
300.0000 mg | ORAL_CAPSULE | Freq: Two times a day (BID) | ORAL | Status: DC
  Filled 2021-05-30 (×2): qty 1

## 2021-05-30 MED ORDER — GUAIFENESIN-DM 100-10 MG/5ML PO SYRP: 10.0000 mL | ORAL_SOLUTION | Freq: Four times a day (QID) | ORAL | 0 refills | Status: DC

## 2021-05-30 MED ORDER — CEFDINIR 300 MG PO CAPS: 300.0000 mg | ORAL_CAPSULE | Freq: Two times a day (BID) | ORAL | 0 refills | Status: AC

## 2021-05-30 MED ORDER — BENZONATATE 100 MG PO CAPS: 100.0000 mg | ORAL_CAPSULE | Freq: Three times a day (TID) | ORAL | 0 refills | Status: DC

## 2021-05-30 MED ORDER — PREDNISONE 20 MG PO TABS: 20.0000 mg | ORAL_TABLET | Freq: Every day | ORAL | 0 refills | Status: AC

## 2021-05-30 NOTE — Progress Notes (Signed)
Erling Conte to be D/C'd Home per MD order.  Discussed prescriptions and follow up appointments with the patient. Prescriptions given to patient, medication list explained in detail. Pt verbalized understanding.  Allergies as of 05/30/2021       Reactions   Sulfa Antibiotics Rash        Medication List     TAKE these medications    alendronate 70 MG tablet Commonly known as: FOSAMAX Take 70 mg by mouth every Sunday.   B-12 5000 MCG Caps Take 5,000 mcg by mouth daily.   benzonatate 100 MG capsule Commonly known as: TESSALON Take 1 capsule (100 mg total) by mouth 3 (three) times daily.   cefdinir 300 MG capsule Commonly known as: OMNICEF Take 1 capsule (300 mg total) by mouth every 12 (twelve) hours for 6 days.   cholecalciferol 25 MCG (1000 UNIT) tablet Commonly known as: VITAMIN D3 Take 1,000 Units by mouth daily.   diclofenac sodium 1 % Gel Commonly known as: VOLTAREN Apply 2-4 g topically 2 (two) times daily as needed (hip pain).   guaiFENesin-dextromethorphan 100-10 MG/5ML syrup Commonly known as: ROBITUSSIN DM Take 10 mLs by mouth 4 (four) times daily.   multivitamin with minerals tablet Take 1 tablet by mouth daily.   nebivolol 10 MG tablet Commonly known as: BYSTOLIC Take 10 mg by mouth daily.   pantoprazole 40 MG tablet Commonly known as: PROTONIX Take 40 mg by mouth daily.   predniSONE 20 MG tablet Commonly known as: DELTASONE Take 1 tablet (20 mg total) by mouth daily with breakfast for 15 days. Start taking on: May 31, 2021   spironolactone 25 MG tablet Commonly known as: ALDACTONE Take 25 mg by mouth daily.        Vitals:   05/30/21 0534 05/30/21 0811  BP: 122/67 (!) 143/79  Pulse: 67 91  Resp: 20 18  Temp: 98.3 F (36.8 C) 98.2 F (36.8 C)  SpO2: 92% 95%    Skin clean, dry and intact without evidence of skin break down, no evidence of skin tears noted. IV catheter discontinued intact. Site without signs and symptoms of  complications. Dressing and pressure applied. Pt denies pain at this time. No complaints noted.  An After Visit Summary was printed and given to the patient. Patient escorted via [place], and D/C home via private auto.  [place] C. Deatra Ina

## 2021-05-30 NOTE — Progress Notes (Signed)
Pulmonary Medicine          Date: 05/30/2021,   MRN# [id number] Morgan Sandoval 08-30-1935     AdmissionWeight: 49.4 kg                 CurrentWeight: 49.4 kg   Referring physician: Dr Posey Pronto   CHIEF COMPLAINT:   Acute hypoxemic respiratory failure   HISTORY OF PRESENT ILLNESS    Morgan Sandoval is a 86 y.o. female with medical history significant for smoking 20 years quit smoking 35 years ago,  hypertension, presents to the emergency department for chief concerns of shortness of breath.   She reports a new cough that has been present for about 1 week.  She reports that the cough is productive and occasionally is white cotton like mucus and then green phlegm.  She also endorses shortness of breath that is worse with the cough.  She tried taking Mucinex at home and this did not improve her symptoms.  The Mucinex caused her to have nausea and therefore she discontinued Mucinex.  At bedside she was able to tell me her name, her age, and she knows she is in the hospital.  She denies fever, vomiting, diarrhea, chest pain, unintentional weight changes, syncope, loss of consciousness, dysuria, hematuria.   She reports that the DuoNeb treatment that was given in the emergency department improved her symptoms significantly.   Patient reports having Pleurosy as a kid and it was severe where doctors did not know if she was going to survive. Patient shares she had recurrent bronchitis throughout her life.   05/30/21- patient is stable for dc.  Please send with PRN albuterol q6h, finish 7d antibiotic course and prednisone 60m po daily [facility]    PAST MEDICAL HISTORY   Past Medical History:  Diagnosis Date  . Cholelithiasis   . Chronic diarrhea   . Colon polyp   . Diverticulosis   . GERD (gastroesophageal reflux disease)    if she eats acidic foods  . Hypertension   . Mucoid cyst of joint    left index finger  . Osteoporosis       SURGICAL HISTORY   Past Surgical History:  Procedure Laterality Date  . APPENDECTOMY    . BIOPSY  05/19/2019   Procedure: BIOPSY;  Surgeon: RRogene Houston MD;  [facility];  Service: Endoscopy;;  sigmoid colon  . CHOLECYSTECTOMY    . COLON SURGERY     Removed large polyps  . COLONOSCOPY    . COLONOSCOPY N/A 05/18/2015   Procedure: COLONOSCOPY;  Surgeon: NRogene Houston MD;  [facility];  Service: Endoscopy;  Laterality: N/A;  1030  . COLONOSCOPY N/A 05/19/2019   Procedure: COLONOSCOPY;  Surgeon: RRogene Houston MD;  [facility];  Service: Endoscopy;  Laterality: N/A;  8:30  . GANGLION CYST EXCISION Left 03/2019   the fore finger  . IR VERTEBROPLASTY LUMBAR BX INC UNI/BIL INC/INJECT/IMAGING  07/27/2020  . MASS EXCISION Left 03/02/2019   Procedure: EXCISION CYST, DEBRIDEMENT PROXIMAL INTERPHALANGEAL JOINT LEFT INDEX FINGER;  Surgeon: KDaryll Brod MD;  [place]  Service: Orthopedics;  Laterality: Left;  . RECTAL SURGERY  2000s   [facility]  . TONSILLECTOMY       FAMILY HISTORY   Family History  Problem Relation Age of Onset  . Cancer - Colon Brother      SOCIAL HISTORY   Social  History   Tobacco Use  . Smoking status: Former  . Smokeless tobacco: Never  Vaping Use  . Vaping Use: Never used  Substance Use Topics  . Alcohol use: Yes    Comment: occ  . Drug use: No     MEDICATIONS    Home Medication:    Current Medication:  Current Facility-Administered Medications:  .  0.9 %  sodium chloride infusion, , Intravenous, PRN, Fritzi Mandes, MD .  acetaminophen (TYLENOL) tablet 650 mg, 650 mg, Oral, Q6H PRN **OR** acetaminophen (TYLENOL) suppository 650 mg, 650 mg, Rectal, Q6H PRN, Cox, Amy N, DO .  benzonatate (TESSALON) capsule 100 mg, 100 mg, Oral, TID, Fritzi Mandes, MD, 100 mg at 05/30/21 6606 .  cefdinir (OMNICEF) capsule 300 mg, 300 mg, Oral, Q12H, Fritzi Mandes, MD .  cholecalciferol  (VITAMIN D3) tablet 1,000 Units, 1,000 Units, Oral, Daily, Cox, Amy N, DO, 1,000 Units at 05/30/21 3016 .  enoxaparin (LOVENOX) injection 40 mg, 40 mg, Subcutaneous, Q24H, Cox, Amy N, DO, 40 mg at 05/29/21 1425 .  feeding supplement (ENSURE ENLIVE / ENSURE PLUS) liquid 237 mL, 237 mL, Oral, BID BM, Fritzi Mandes, MD, 237 mL at 05/29/21 1358 .  guaiFENesin-dextromethorphan (ROBITUSSIN DM) 100-10 MG/5ML syrup 10 mL, 10 mL, Oral, QID, Fritzi Mandes, MD, 10 mL at 05/30/21 0109 .  ipratropium-albuterol (DUONEB) 0.5-2.5 (3) MG/3ML nebulizer solution 3 mL, 3 mL, Nebulization, TID, Cox, Amy N, DO, 3 mL at 05/30/21 0745 .  multivitamin with minerals tablet 1 tablet, 1 tablet, Oral, Daily, Cox, Amy N, DO, 1 tablet at 05/28/21 0937 .  nebivolol (BYSTOLIC) tablet 10 mg, 10 mg, Oral, Daily, Cox, Amy N, DO, 10 mg at 05/30/21 0823 .  ondansetron (ZOFRAN) tablet 4 mg, 4 mg, Oral, Q6H PRN **OR** ondansetron (ZOFRAN) injection 4 mg, 4 mg, Intravenous, Q6H PRN, Cox, Amy N, DO .  pantoprazole (PROTONIX) EC tablet 40 mg, 40 mg, Oral, BID AC, Fritzi Mandes, MD, 40 mg at 05/30/21 0823 .  predniSONE (DELTASONE) tablet 20 mg, 20 mg, Oral, Q breakfast, Laurin Paulo, MD, 20 mg at 05/30/21 3235 .  spironolactone (ALDACTONE) tablet 25 mg, 25 mg, Oral, Daily, Cox, Amy N, DO, 25 mg at 05/30/21 5732 .  traZODone (DESYREL) tablet 25 mg, 25 mg, Oral, QHS, Fritzi Mandes, MD, 25 mg at 05/29/21 2154 .  vitamin B-12 (CYANOCOBALAMIN) tablet 5,000 mcg, 5,000 mcg, Oral, Daily, Cox, Amy N, DO, 5,000 mcg at 05/27/21 1646    ALLERGIES   Sulfa antibiotics     REVIEW OF SYSTEMS    Review of Systems:  Gen:  Denies  fever, sweats, chills weigh loss  HEENT: Denies blurred vision, double vision, ear pain, eye pain, hearing loss, nose bleeds, sore throat Cardiac:  No dizziness, chest pain or heaviness, chest tightness,edema Resp:   Denies cough or sputum porduction, shortness of breath,wheezing, hemoptysis,  Gi: Denies swallowing  difficulty, stomach pain, nausea or vomiting, diarrhea, constipation, bowel incontinence Gu:  Denies bladder incontinence, burning urine Ext:   Denies Joint pain, stiffness or swelling Skin: Denies  skin rash, easy bruising or bleeding or hives Endoc:  Denies polyuria, polydipsia , polyphagia or weight change Psych:   Denies depression, insomnia or hallucinations   Other:  All other systems negative   VS: BP (!) 143/79 (BP Location: Left Arm)   Pulse 91   Temp 98.2 F (36.8 C)   Resp 18   Ht 5' 5"  (1.651 m)   Wt 49.4 kg   SpO2 95%  BMI 18.14 kg/m      PHYSICAL EXAM    GENERAL:NAD, no fevers, chills, no weakness no fatigue HEAD: Normocephalic, atraumatic.  EYES: Pupils equal, round, reactive to light. Extraocular muscles intact. No scleral icterus.  MOUTH: Moist mucosal membrane. Dentition intact. No abscess noted.  EAR, NOSE, THROAT: Clear without exudates. No external lesions.  NECK: Supple. No thyromegaly. No nodules. No JVD.  PULMONARY: mild  CARDIOVASCULAR: S1 and S2. Regular rate and rhythm. No murmurs, rubs, or gallops. No edema. Pedal pulses 2+ bilaterally.  GASTROINTESTINAL: Soft, nontender, nondistended. No masses. Positive bowel sounds. No hepatosplenomegaly.  MUSCULOSKELETAL: No swelling, clubbing, or edema. Range of motion full in all extremities.  NEUROLOGIC: Cranial nerves II through XII are intact. No gross focal neurological deficits. Sensation intact. Reflexes intact.  SKIN: No ulceration, lesions, rashes, or cyanosis. Skin warm and dry. Turgor intact.  PSYCHIATRIC: Mood, affect within normal limits. The patient is awake, alert and oriented x 3. Insight, judgment intact.       IMAGING    DG Chest 2 View  Result Date: 05/27/2021 CLINICAL DATA:  Dyspnea EXAM: CHEST - 2 VIEW COMPARISON:  None. FINDINGS: The lungs are mildly, symmetrically hyperinflated in keeping with changes of underlying COPD. There is moderate central bronchiectasis noted. There is  superimposed reticular infiltrate noted within the right upper lung zone peripherally and left mid and lower lung zone which may reflect changes of superimposed acute multifocal pneumonia in the appropriate clinical setting. No pneumothorax or pleural effusion. Cardiac size within normal limits. Pulmonary vascularity is normal. No acute bone abnormality. IMPRESSION: Multifocal pulmonary infiltrate, most in keeping with atypical infection in the appropriate clinical setting. Moderate central bronchiectasis. COPD. This could be better assessed with nonemergent CT examination, if indicated. Electronically Signed   By: Fidela Salisbury M.D.   On: 05/27/2021 12:38   CT CHEST WO CONTRAST  Result Date: 05/29/2021 CLINICAL DATA:  Cough, COPD exacerbation. EXAM: CT CHEST WITHOUT CONTRAST TECHNIQUE: Multidetector CT imaging of the chest was performed following the standard protocol without IV contrast. COMPARISON:  May 27, 2021. FINDINGS: Cardiovascular: Atherosclerosis of thoracic aorta is noted without aneurysm formation. Normal cardiac size. No pericardial effusion. Moderate coronary artery calcifications are noted. Mediastinum/Nodes: No significantly enlarged mediastinal or axillary lymph nodes. Thyroid gland, trachea, and esophagus demonstrate no significant findings. Lungs/Pleura: No pneumothorax or pleural effusion is noted. Peripheral honeycombing is noted in both lower lobes with associated bronchiectasis of both lower lobes. Peripheral reticular densities are noted in both upper lobes as well. These findings are most consistent with pulmonary fibrosis, although superimposed acute atypical inflammation cannot be excluded. Upper Abdomen: No acute abnormality. Musculoskeletal: Status post L1 kyphoplasty. No acute osseous abnormality is noted. IMPRESSION: Peripheral reticular densities are noted throughout both lungs with peripheral honeycombing and bronchiectasis seen in the lower lobes, most consistent with  pulmonary fibrosis, although acute superimposed atypical inflammation cannot be excluded. Moderate coronary artery calcifications are noted suggesting coronary artery disease. Aortic Atherosclerosis (ICD10-I70.0). Electronically Signed   By: Marijo Conception M.D.   On: 05/29/2021 13:03      ASSESSMENT/PLAN    Acute hypoxemic respiratory failure -appears to be due to acute exacerbation of idiopathic pulmonary fibrosis - empirically patient should have CAP therapy   -patient has severe GERD with s/p endoscopic esophageal dilation which can make GERD worse and exacerbate interstitial lung disease.  - present on admission  - COVID19 negative   - supplemental O2 during my evaluation -room air -Respiratory viral panel -serum fungitell -  legionella ab -strep pneumoniae ur AG -Histoplasma Ur Ag -sputum resp cultures -AFB sputum expectorated specimen -sputum cytology  -reviewed pertinent imaging with patient today - ESR, CRP -PT/OT  -please encourage patient to use incentive spirometer few times each hour while hospitalized.         Thank you for allowing me to participate in the care of this patient.   Patient/Family are satisfied with care plan and all questions have been answered.  This document was prepared using Dragon voice recognition software and may include unintentional dictation errors.     Ottie Glazier, M.D.  Division of [place]

## 2021-05-30 NOTE — Discharge Summary (Signed)
[facility] [place] NAME: Morgan Sandoval    MR#:  [id number]  DATE OF BIRTH:  03/12/1935  DATE OF ADMISSION:  05/27/2021 ADMITTING PHYSICIAN: Amy N Cox, DO  DATE OF DISCHARGE: 05/30/2021  PRIMARY CARE PHYSICIAN: [name], MD    ADMISSION DIAGNOSIS:  Cough [R05.9] PNA (pneumonia) [J18.9] Multifocal pneumonia [J18.9]  DISCHARGE DIAGNOSIS:  sepsis secondary to multifocal pneumonia interstitial lung disease/pulmonary fibrosis/bronchiectasis leukocytosis SECONDARY DIAGNOSIS:   Past Medical History:  Diagnosis Date  . Cholelithiasis   . Chronic diarrhea   . Colon polyp   . Diverticulosis   . GERD (gastroesophageal reflux disease)    if she eats acidic foods  . Hypertension   . Mucoid cyst of joint    left index finger  . Osteoporosis     HOSPITAL COURSE:   Morgan Sandoval is a 86 y.o. female with medical history significant for COPD/emphysema, hypertension, presents to the emergency department for chief concerns of shortness of breath.  She reports a new cough that has been present for about 1 week.  She reports that the cough is productive and occasionally is white cotton like mucus and then green phlegm.  She also endorses shortness of breath that is worse with the cough.   Sepsis POA due to PNA Shortness of breath suspect secondary to COPD exacerbation in setting of pneumonia Interstitial lung disease/pulmonary fibrosis (on CT chest) - lactic acid 2.0, leucocytosis, Abnormal CXR, Procalcitonin - DuoNebs 3 times daily ordered --Solu-Medrol 40 mg twice daily scheduled for 05/28/2021-- d/c since pt not wheezing and clinically much improved -- change to oral antibiotic. -- CT chest shows interstitial lung fibrosis with bronchiectasis -- seen by pulmonary Dr Lanney Gins-- sputum has been sent for various test. Patient will follow-up with pulmonary as outpatient. -- started on prednisone 20 mg PO daily by pulm -- patient unable  to give adequate sputum for AFB. -- And will follow-up with pulmonary regarding her other labs/test ordered by Dr. Lanney Gins   COPD exacerbation secondary to pneumonia ---DuoNebs 3 times daily ordered -- currently on room air.    Leukocytosis secondary to pneumonia/steroids received in the ER  --came in with 26K--19K--25K --po steroids -- patient is afebrile. Does not appear septic. Steroids could be contributing elevated white count along with pneumonia  History of hypertension -resumed nebivolol, spironolactone   History of tobacco use-quit in 1980     Family communication :daughter Morgan Sandoval on phone Consults : pulmonary CODE STATUS: Full DVT Prophylaxis :lovenox Level of care: Med-Surg Status is: Inpatient    Dispo: The patient is from: Home              Anticipated d/c is to:  [facility] today              Patient currently is medically optimized for discharge              Difficult to place patient No   CONSULTS OBTAINED:  Treatment Team:  Ottie Glazier, MD  DRUG ALLERGIES:   Allergies  Allergen Reactions  . Sulfa Antibiotics Rash    DISCHARGE MEDICATIONS:   Allergies as of 05/30/2021       Reactions   Sulfa Antibiotics Rash        Medication List     TAKE these medications    alendronate 70 MG tablet Commonly known as: FOSAMAX Take 70 mg by mouth every Sunday.   B-12 5000 MCG Caps Take 5,000  mcg by mouth daily.   benzonatate 100 MG capsule Commonly known as: TESSALON Take 1 capsule (100 mg total) by mouth 3 (three) times daily.   cefdinir 300 MG capsule Commonly known as: OMNICEF Take 1 capsule (300 mg total) by mouth every 12 (twelve) hours for 6 days.   cholecalciferol 25 MCG (1000 UNIT) tablet Commonly known as: VITAMIN D3 Take 1,000 Units by mouth daily.   diclofenac sodium 1 % Gel Commonly known as: VOLTAREN Apply 2-4 g topically 2 (two) times daily as needed (hip pain).   guaiFENesin-dextromethorphan 100-10  MG/5ML syrup Commonly known as: ROBITUSSIN DM Take 10 mLs by mouth 4 (four) times daily.   multivitamin with minerals tablet Take 1 tablet by mouth daily.   nebivolol 10 MG tablet Commonly known as: BYSTOLIC Take 10 mg by mouth daily.   pantoprazole 40 MG tablet Commonly known as: PROTONIX Take 40 mg by mouth daily.   predniSONE 20 MG tablet Commonly known as: DELTASONE Take 1 tablet (20 mg total) by mouth daily with breakfast for 15 days. Start taking on: May 31, 2021   spironolactone 25 MG tablet Commonly known as: ALDACTONE Take 25 mg by mouth daily.        If you experience worsening of your admission symptoms, develop shortness of breath, life threatening emergency, suicidal or homicidal thoughts you must seek medical attention immediately by calling 911 or calling your MD immediately  if symptoms less severe.  You Must read complete instructions/literature along with all the possible adverse reactions/side effects for all the Medicines you take and that have been prescribed to you. Take any new Medicines after you have completely understood and accept all the possible adverse reactions/side effects.   Please note  You were cared for by a hospitalist during your hospital stay. If you have any questions about your discharge medications or the care you received while you were in the hospital after you are discharged, you can call the unit and asked to speak with the hospitalist on call if the hospitalist that took care of you is not available. Once you are discharged, your primary care physician will handle any further medical issues. Please note that NO REFILLS for any discharge medications will be authorized once you are discharged, as it is imperative that you return to your primary care physician (or establish a relationship with a primary care physician if you do not have one) for your aftercare needs so that they can reassess your need for medications and monitor your  lab values. Today   SUBJECTIVE   Doing ok. No SOB. Some cough. Slept some better last nite  VITAL SIGNS:  Blood pressure (!) 143/79, pulse 91, temperature 98.2 F (36.8 C), resp. rate 18, height '5\' 5"'$  (1.651 m), weight 49.4 kg, SpO2 95 %.  I/O:   Intake/Output Summary (Last 24 hours) at 05/30/2021 1146 Last data filed at 05/29/2021 1300 Gross per 24 hour  Intake 120 ml  Output --  Net 120 ml    PHYSICAL EXAMINATION:  GENERAL:  86 y.o.-year-old patient lying in the bed with no acute distress.  LUNGS: decreased breath sounds bilaterally, no wheezing, rales, rhonchi. No use of accessory muscles of respiration.  CARDIOVASCULAR: S1, S2 normal. No murmurs, rubs, or gallops.  ABDOMEN: Soft, nontender, nondistended. Bowel sounds present. No organomegaly or mass.  EXTREMITIES: No cyanosis, clubbing or edema b/l.    NEUROLOGIC: non focal PSYCHIATRIC:  patient is alert and oriented x 3.  SKIN: No obvious rash, lesion,  or ulcer.  DATA REVIEW:   CBC  Recent Labs  Lab 05/29/21 0931  WBC 25.6*  HGB 13.8  HCT 39.1  PLT 319    Chemistries  Recent Labs  Lab 05/28/21 0520  NA 135  K 3.7  CL 101  CO2 26  GLUCOSE 176*  BUN 18  CREATININE 0.87  CALCIUM 8.3*    Microbiology Results   Recent Results (from the past 240 hour(s))  Resp Panel by RT-PCR (Flu A&B, Covid) Nasopharyngeal Swab     Status: None   Collection Time: 05/27/21 12:50 PM   Specimen: Nasopharyngeal Swab; Nasopharyngeal(NP) swabs in vial transport medium  Result Value Ref Range Status   SARS Coronavirus 2 by RT PCR NEGATIVE NEGATIVE Final    Comment: (NOTE) SARS-CoV-2 target nucleic acids are NOT DETECTED.  The SARS-CoV-2 RNA is generally detectable in upper respiratory specimens during the acute phase of infection. The lowest concentration of SARS-CoV-2 viral copies this assay can detect is 138 copies/mL. A negative result does not preclude SARS-Cov-2 infection and should not be used as the sole basis for  treatment or other patient management decisions. A negative result may occur with  improper specimen collection/handling, submission of specimen other than nasopharyngeal swab, presence of viral mutation(s) within the areas targeted by this assay, and inadequate number of viral copies(<138 copies/mL). A negative result must be combined with clinical observations, patient history, and epidemiological information. The expected result is Negative.  Fact Sheet for Patients:  EntrepreneurPulse.com.au  Fact Sheet for Healthcare Providers:  IncredibleEmployment.be  This test is no t yet approved or cleared by the [place] FDA and  has been authorized for detection and/or diagnosis of SARS-CoV-2 by FDA under an Emergency Use Authorization (EUA). This EUA will remain  in effect (meaning this test can be used) for the duration of the COVID-19 declaration under Section 564(b)(1) of the Act, 21 U.S.C.section 360bbb-3(b)(1), unless the authorization is terminated  or revoked sooner.       Influenza A by PCR NEGATIVE NEGATIVE Final   Influenza B by PCR NEGATIVE NEGATIVE Final    Comment: (NOTE) The Xpert Xpress SARS-CoV-2/FLU/RSV plus assay is intended as an aid in the diagnosis of influenza from Nasopharyngeal swab specimens and should not be used as a sole basis for treatment. Nasal washings and aspirates are unacceptable for Xpert Xpress SARS-CoV-2/FLU/RSV testing.  Fact Sheet for Patients: EntrepreneurPulse.com.au  Fact Sheet for Healthcare Providers: IncredibleEmployment.be  This test is not yet approved or cleared by the [place] FDA and has been authorized for detection and/or diagnosis of SARS-CoV-2 by FDA under an Emergency Use Authorization (EUA). This EUA will remain in effect (meaning this test can be used) for the duration of the COVID-19 declaration under Section 564(b)(1) of the Act, 21  U.S.C. section 360bbb-3(b)(1), unless the authorization is terminated or revoked.  Performed at [facility], [address] [place], [place] [address]   CULTURE, BLOOD (ROUTINE X 2) w Reflex to ID Panel     Status: None (Preliminary result)   Collection Time: 05/27/21  2:15 PM   Specimen: BLOOD  Result Value Ref Range Status   Specimen Description BLOOD LEFT ANTECUBITAL  Final   Special Requests   Final    BOTTLES DRAWN AEROBIC AND ANAEROBIC Blood Culture adequate volume   Culture   Final    NO GROWTH 3 DAYS Performed at [facility], [address] [place], [place] [address]    Report Status PENDING  Incomplete  CULTURE, BLOOD (ROUTINE X 2) w Reflex to ID Panel     Status: None (Preliminary result)   Collection Time: 05/27/21  2:22 PM   Specimen: BLOOD  Result Value Ref Range Status   Specimen Description BLOOD RIGHT ANTECUBITAL  Final   Special Requests   Final    BOTTLES DRAWN AEROBIC AND ANAEROBIC Blood Culture adequate volume   Culture   Final    NO GROWTH 3 DAYS Performed at [facility], [address] [place], [place] [address]    Report Status PENDING  Incomplete  Respiratory (~20 pathogens) panel by PCR     Status: None   Collection Time: 05/29/21  2:36 PM   Specimen: Nasopharyngeal Swab; Respiratory  Result Value Ref Range Status   Adenovirus NOT DETECTED NOT DETECTED Final   Coronavirus 229E NOT DETECTED NOT DETECTED Final    Comment: (NOTE) The Coronavirus on the Respiratory Panel, DOES NOT test for the novel  Coronavirus (2019 nCoV)    Coronavirus HKU1 NOT DETECTED NOT DETECTED Final   Coronavirus NL63 NOT DETECTED NOT DETECTED Final   Coronavirus OC43 NOT DETECTED NOT DETECTED Final   Metapneumovirus NOT DETECTED NOT DETECTED Final   Rhinovirus / Enterovirus NOT DETECTED NOT DETECTED Final   Influenza A NOT DETECTED NOT DETECTED Final   Influenza B NOT DETECTED NOT DETECTED Final   Parainfluenza Virus 1 NOT DETECTED NOT  DETECTED Final   Parainfluenza Virus 2 NOT DETECTED NOT DETECTED Final   Parainfluenza Virus 3 NOT DETECTED NOT DETECTED Final   Parainfluenza Virus 4 NOT DETECTED NOT DETECTED Final   Respiratory Syncytial Virus NOT DETECTED NOT DETECTED Final   Bordetella pertussis NOT DETECTED NOT DETECTED Final   Bordetella Parapertussis NOT DETECTED NOT DETECTED Final   Chlamydophila pneumoniae NOT DETECTED NOT DETECTED Final   Mycoplasma pneumoniae NOT DETECTED NOT DETECTED Final    Comment: Performed at [facility], [place]. [address] [place], [place] [address]  Expectorated Sputum Assessment w Gram Stain, Rflx to Resp Cult     Status: None   Collection Time: 05/30/21  8:15 AM   Specimen: Sputum  Result Value Ref Range Status   Specimen Description SPUTUM  Final   Special Requests NONE  Final   Sputum evaluation   Final    Sputum specimen not acceptable for testing.  Please recollect.   RESULT CALLED TO, READ BACK BY AND VERIFIED WITH: MERISSA VANUA 05/30/21 [id number] Performed at [facility], [address] [place], [place] [address]    Report Status 05/30/2021 FINAL  Final    RADIOLOGY:  CT CHEST WO CONTRAST  Result Date: 05/29/2021 CLINICAL DATA:  Cough, COPD exacerbation. EXAM: CT CHEST WITHOUT CONTRAST TECHNIQUE: Multidetector CT imaging of the chest was performed following the standard protocol without IV contrast. COMPARISON:  May 27, 2021. FINDINGS: Cardiovascular: Atherosclerosis of thoracic aorta is noted without aneurysm formation. Normal cardiac size. No pericardial effusion. Moderate coronary artery calcifications are noted. Mediastinum/Nodes: No significantly enlarged mediastinal or axillary lymph nodes. Thyroid gland, trachea, and esophagus demonstrate no significant findings. Lungs/Pleura: No pneumothorax or pleural effusion is noted. Peripheral honeycombing is noted in both lower lobes with associated bronchiectasis of both lower lobes. Peripheral reticular  densities are noted in both upper lobes as well. These findings are most consistent with pulmonary fibrosis, although superimposed acute atypical inflammation cannot be excluded. Upper Abdomen: No acute abnormality. Musculoskeletal: Status post L1 kyphoplasty. No acute osseous abnormality is noted. IMPRESSION: Peripheral reticular densities are noted  throughout both lungs with peripheral honeycombing and bronchiectasis seen in the lower lobes, most consistent with pulmonary fibrosis, although acute superimposed atypical inflammation cannot be excluded. Moderate coronary artery calcifications are noted suggesting coronary artery disease. Aortic Atherosclerosis (ICD10-I70.0). Electronically Signed   By: Marijo Conception M.D.   On: 05/29/2021 13:03     CODE STATUS:     Code Status Orders  (From admission, onward)           Start     Ordered   05/27/21 1422  Full code  Continuous        05/27/21 1423           Code Status History     This patient has a current code status but no historical code status.      Advance Directive Documentation    Flowsheet Row Most Recent Value  Type of Advance Directive Healthcare Power of Attorney, Living will  Pre-existing out of facility DNR order (yellow form or pink MOST form) --  "MOST" Form in Place? --        TOTAL TIME TAKING CARE OF THIS PATIENT: 35 minutes.    Fritzi Mandes M.D  [facility]    CC: Primary care physician; [name], MD

## 2021-05-31 ENCOUNTER — Ambulatory Visit: Admission: RE | Admit: 2021-05-31 | Payer: Medicare Other | Source: Home / Self Care

## 2021-05-31 ENCOUNTER — Encounter: Admission: RE | Payer: Self-pay | Source: Home / Self Care

## 2021-05-31 LAB — FUNGITELL, SERUM: Fungitell Result: <31 pg/mL (ref <80)

## 2021-05-31 SURGERY — ESOPHAGOGASTRODUODENOSCOPY (EGD) WITH PROPOFOL
Anesthesia: General

## 2021-06-01 LAB — CULTURE, BLOOD (ROUTINE X 2)
Culture: NO GROWTH
Culture: NO GROWTH
Special Requests: ADEQUATE
Special Requests: ADEQUATE

## 2021-06-04 DIAGNOSIS — I10 Essential (primary) hypertension: Secondary | ICD-10-CM | POA: Diagnosis not present

## 2021-06-04 DIAGNOSIS — Z299 Encounter for prophylactic measures, unspecified: Secondary | ICD-10-CM | POA: Diagnosis not present

## 2021-06-04 DIAGNOSIS — Z09 Encounter for follow-up examination after completed treatment for conditions other than malignant neoplasm: Secondary | ICD-10-CM | POA: Diagnosis not present

## 2021-06-04 DIAGNOSIS — I272 Pulmonary hypertension, unspecified: Secondary | ICD-10-CM | POA: Diagnosis not present

## 2021-06-07 DIAGNOSIS — J849 Interstitial pulmonary disease, unspecified: Secondary | ICD-10-CM | POA: Diagnosis not present

## 2021-06-11 DIAGNOSIS — Z299 Encounter for prophylactic measures, unspecified: Secondary | ICD-10-CM | POA: Diagnosis not present

## 2021-06-11 DIAGNOSIS — Z681 Body mass index (BMI) 19 or less, adult: Secondary | ICD-10-CM | POA: Diagnosis not present

## 2021-06-11 DIAGNOSIS — I10 Essential (primary) hypertension: Secondary | ICD-10-CM | POA: Diagnosis not present

## 2021-06-11 DIAGNOSIS — Z7189 Other specified counseling: Secondary | ICD-10-CM | POA: Diagnosis not present

## 2021-06-11 DIAGNOSIS — Z0000 Encounter for general adult medical examination without abnormal findings: Secondary | ICD-10-CM | POA: Diagnosis not present

## 2021-06-19 ENCOUNTER — Other Ambulatory Visit: Payer: Self-pay

## 2021-06-19 ENCOUNTER — Ambulatory Visit: Payer: Medicare Other | Admitting: Nurse Practitioner

## 2021-06-19 ENCOUNTER — Encounter: Payer: Self-pay | Admitting: Nurse Practitioner

## 2021-06-19 VITALS — BP 123/77 | HR 66 | Temp 97.9°F | Ht 65.0 in | Wt 108.5 lb

## 2021-06-19 DIAGNOSIS — J302 Other seasonal allergic rhinitis: Secondary | ICD-10-CM

## 2021-06-19 NOTE — Progress Notes (Signed)
Careteam: Patient Care Team: [name], MD as PCP - General (Internal Medicine)  Advanced Directive information    Allergies  Allergen Reactions   Sulfa Antibiotics Rash    Chief Complaint  Patient presents with   Acute Visit    Patient needs ears checked. Patient states she has an echo in her ears. When they were checked ears were red on the inside, but no pain. Patient also states that she has broken out on her tongue. Her tongue is sensitive.Ear symptoms started last Friday and tongue symptoms popped up last night.     HPI: Patient is a 86 y.o. female seen in today at the [facility] for ear feeling full. Feels like a popping or cracking in her ear over the last 5 days.  She went to the drug store and they recommended generic sudafed.  She reports hx of htn.  Reports she has a lot of drainage in her sinuses.  Was diagnosised with pulmonary fibrosis. She is currently on prednisone- on her second week of medication.  Feels drainage at night which has been ongoing for a few months.  Off and on for years.   Has had seasonal allergies.   Hard of hearing- worse lately  No fevers or chills. Overall does not feel bad.   Review of Systems:  Review of Systems  Constitutional:  Negative for chills, fever and weight loss.  HENT:  Positive for congestion. Negative for tinnitus.   Respiratory:  Negative for cough, sputum production and shortness of breath.   Cardiovascular:  Negative for chest pain, palpitations and leg swelling.  Gastrointestinal:  Negative for abdominal pain, constipation, diarrhea and heartburn.  Genitourinary:  Negative for dysuria, frequency and urgency.  Musculoskeletal:  Negative for back pain, falls, joint pain and myalgias.  Skin: Negative.   Neurological:  Negative for dizziness and headaches.  Psychiatric/Behavioral:  Negative for depression and memory loss. The patient does not have insomnia.    Past Medical History:  Diagnosis Date    Cholelithiasis    Chronic diarrhea    Colon polyp    Diverticulosis    GERD (gastroesophageal reflux disease)    if she eats acidic foods   Hypertension    Mucoid cyst of joint    left index finger   Osteoporosis    Past Surgical History:  Procedure Laterality Date   APPENDECTOMY     BIOPSY  05/19/2019   Procedure: BIOPSY;  Surgeon: Rogene Houston, MD;  [facility];  Service: Endoscopy;;  sigmoid colon   CHOLECYSTECTOMY     COLON SURGERY     Removed large polyps   COLONOSCOPY     COLONOSCOPY N/A 05/18/2015   Procedure: COLONOSCOPY;  Surgeon: Rogene Houston, MD;  [facility]: [facility];  Service: Endoscopy;  Laterality: N/A;  1030   COLONOSCOPY N/A 05/19/2019   Procedure: COLONOSCOPY;  Surgeon: Rogene Houston, MD;  [facility];  Service: Endoscopy;  Laterality: N/A;  8:30   GANGLION CYST EXCISION Left 03/2019   the fore finger   IR VERTEBROPLASTY LUMBAR BX INC UNI/BIL INC/INJECT/IMAGING  07/27/2020   MASS EXCISION Left 03/02/2019   Procedure: EXCISION CYST, DEBRIDEMENT PROXIMAL INTERPHALANGEAL JOINT LEFT INDEX FINGER;  Surgeon: Daryll Brod, MD;  Location: [facility];  Service: Orthopedics;  Laterality: Left;   RECTAL SURGERY  2000s   [facility]   TONSILLECTOMY     Social History:   reports that she has quit  smoking. She has never used smokeless tobacco. She reports current alcohol use. She reports that she does not use drugs.  Family History  Problem Relation Age of Onset   Cancer - Colon Brother     Medications: Patient's Medications  New Prescriptions   No medications on file  Previous Medications   ALENDRONATE (FOSAMAX) 70 MG TABLET    Take 70 mg by mouth every Sunday.   BENZONATATE (TESSALON) 100 MG CAPSULE    Take 1 capsule (100 mg total) by mouth 3 (three) times daily.   CHOLECALCIFEROL (VITAMIN D3) 25 MCG (1000 UNIT) TABLET    Take 1,000 Units by mouth daily.   CYANOCOBALAMIN (B-12) 5000 MCG CAPS    Take 5,000 mcg by  mouth daily.   DICLOFENAC SODIUM (VOLTAREN) 1 % GEL    Apply 2-4 g topically 2 (two) times daily as needed (hip pain).   MULTIPLE VITAMINS-MINERALS (MULTIVITAMIN WITH MINERALS) TABLET    Take 1 tablet by mouth daily.   NEBIVOLOL (BYSTOLIC) 10 MG TABLET    Take 10 mg by mouth daily.   PANTOPRAZOLE (PROTONIX) 40 MG TABLET    Take 40 mg by mouth daily.   PREDNISONE (DELTASONE) 10 MG TABLET    Take 10 mg by mouth daily.   SPIRONOLACTONE (ALDACTONE) 25 MG TABLET    Take 25 mg by mouth daily.  Modified Medications   No medications on file  Discontinued Medications   GUAIFENESIN-DEXTROMETHORPHAN (ROBITUSSIN DM) 100-10 MG/5ML SYRUP    Take 10 mLs by mouth 4 (four) times daily.    Physical Exam:  Vitals:   06/19/21 1309  BP: 123/77  Pulse: 66  Temp: 97.9 F (36.6 C)  TempSrc: Oral  SpO2: 97%  Weight: 108 lb 8 oz (49.2 kg)  Height: '5\' 5"'$  (1.651 m)   Body mass index is 18.06 kg/m. Wt Readings from Last 3 Encounters:  06/19/21 108 lb 8 oz (49.2 kg)  05/27/21 109 lb (49.4 kg)  11/30/20 110 lb (49.9 kg)    Physical Exam Constitutional:      General: She is not in acute distress.    Appearance: She is well-developed. She is not diaphoretic.  HENT:     Head: Normocephalic and atraumatic.     Right Ear: Tympanic membrane and ear canal normal.     Left Ear: Tympanic membrane and ear canal normal.     Nose: Congestion and rhinorrhea present.     Mouth/Throat:     Mouth: Mucous membranes are moist.     Pharynx: No oropharyngeal exudate or posterior oropharyngeal erythema.  Eyes:     Conjunctiva/sclera: Conjunctivae normal.     Pupils: Pupils are equal, round, and reactive to light.  Cardiovascular:     Rate and Rhythm: Normal rate and regular rhythm.     Heart sounds: Normal heart sounds.  Pulmonary:     Effort: Pulmonary effort is normal.     Breath sounds: Normal breath sounds.  Musculoskeletal:     Cervical back: Normal range of motion and neck supple.     Right lower leg: No  edema.     Left lower leg: No edema.  Skin:    General: Skin is warm and dry.  Neurological:     Mental Status: She is alert and oriented to person, place, and time.  Psychiatric:        Mood and Affect: Mood normal.    Labs reviewed: Basic Metabolic Panel: Recent Labs    07/27/20 0741 05/27/21 1250 05/28/21  0520  NA 136 132* 135  K 3.7 3.8 3.7  CL 96* 97* 101  CO2 '28 24 26  '$ GLUCOSE 98 141* 176*  BUN '21 17 18  '$ CREATININE 0.89 1.01* 0.87  CALCIUM 9.1 8.6* 8.3*   Liver Function Tests: No results for input(s): AST, ALT, ALKPHOS, BILITOT, PROT, ALBUMIN in the last 8760 hours. No results for input(s): LIPASE, AMYLASE in the last 8760 hours. No results for input(s): AMMONIA in the last 8760 hours. CBC: Recent Labs    05/27/21 1250 05/28/21 0520 05/29/21 0931  WBC 26.6* 19.2* 25.6*  HGB 15.3* 13.9 13.8  HCT 43.6 39.6 39.1  MCV 94.8 93.0 94.7  PLT 260 274 319   Lipid Panel: No results for input(s): CHOL, HDL, LDLCALC, TRIG, CHOLHDL, LDLDIRECT in the last 8760 hours. TSH: No results for input(s): TSH in the last 8760 hours. A1C: No results found for: HGBA1C   Assessment/Plan 1. Seasonal allergies -to start zyrtec 10 mg daily -stop sudafed (reviewed side effects with pt) -to use nasal wash daily -to increase flonase to twice daily     Morgan Sandoval K. [place], [facility] [phone]

## 2021-06-19 NOTE — Patient Instructions (Signed)
To start zyrtec 10 mg by mouth daily (can take generic, no D(decongestant))  Nasal wash daily (more if you want)  Increase flonase to 1 spray into both nares twice daily.

## 2021-07-11 DIAGNOSIS — M19042 Primary osteoarthritis, left hand: Secondary | ICD-10-CM | POA: Diagnosis not present

## 2021-07-11 DIAGNOSIS — R899 Unspecified abnormal finding in specimens from other organs, systems and tissues: Secondary | ICD-10-CM | POA: Diagnosis not present

## 2021-07-11 DIAGNOSIS — M19041 Primary osteoarthritis, right hand: Secondary | ICD-10-CM | POA: Diagnosis not present

## 2021-07-11 DIAGNOSIS — J8410 Pulmonary fibrosis, unspecified: Secondary | ICD-10-CM | POA: Diagnosis not present

## 2021-07-11 DIAGNOSIS — M7989 Other specified soft tissue disorders: Secondary | ICD-10-CM | POA: Diagnosis not present

## 2021-07-11 DIAGNOSIS — M1812 Unilateral primary osteoarthritis of first carpometacarpal joint, left hand: Secondary | ICD-10-CM | POA: Diagnosis not present

## 2021-07-11 DIAGNOSIS — M1811 Unilateral primary osteoarthritis of first carpometacarpal joint, right hand: Secondary | ICD-10-CM | POA: Diagnosis not present

## 2021-08-02 DIAGNOSIS — J449 Chronic obstructive pulmonary disease, unspecified: Secondary | ICD-10-CM | POA: Diagnosis not present

## 2021-08-02 DIAGNOSIS — Z01818 Encounter for other preprocedural examination: Secondary | ICD-10-CM | POA: Diagnosis not present

## 2021-08-23 DIAGNOSIS — M0510 Rheumatoid lung disease with rheumatoid arthritis of unspecified site: Secondary | ICD-10-CM | POA: Insufficient documentation

## 2021-08-23 DIAGNOSIS — M0579 Rheumatoid arthritis with rheumatoid factor of multiple sites without organ or systems involvement: Secondary | ICD-10-CM | POA: Diagnosis not present

## 2021-08-23 DIAGNOSIS — Z7960 Long term (current) use of unspecified immunomodulators and immunosuppressants: Secondary | ICD-10-CM | POA: Diagnosis not present

## 2021-09-25 DIAGNOSIS — H903 Sensorineural hearing loss, bilateral: Secondary | ICD-10-CM | POA: Diagnosis not present

## 2021-09-25 DIAGNOSIS — J300 Vasomotor rhinitis: Secondary | ICD-10-CM | POA: Diagnosis not present

## 2021-09-25 DIAGNOSIS — H6122 Impacted cerumen, left ear: Secondary | ICD-10-CM | POA: Diagnosis not present

## 2021-10-16 DIAGNOSIS — M25571 Pain in right ankle and joints of right foot: Secondary | ICD-10-CM | POA: Diagnosis not present

## 2021-10-16 DIAGNOSIS — M0579 Rheumatoid arthritis with rheumatoid factor of multiple sites without organ or systems involvement: Secondary | ICD-10-CM | POA: Diagnosis not present

## 2021-10-16 DIAGNOSIS — M0510 Rheumatoid lung disease with rheumatoid arthritis of unspecified site: Secondary | ICD-10-CM | POA: Diagnosis not present

## 2021-10-16 DIAGNOSIS — Z7960 Long term (current) use of unspecified immunomodulators and immunosuppressants: Secondary | ICD-10-CM | POA: Diagnosis not present

## 2021-10-18 DIAGNOSIS — Z87891 Personal history of nicotine dependence: Secondary | ICD-10-CM | POA: Diagnosis not present

## 2021-10-18 DIAGNOSIS — M0579 Rheumatoid arthritis with rheumatoid factor of multiple sites without organ or systems involvement: Secondary | ICD-10-CM | POA: Diagnosis not present

## 2021-10-18 DIAGNOSIS — M48061 Spinal stenosis, lumbar region without neurogenic claudication: Secondary | ICD-10-CM | POA: Diagnosis not present

## 2021-10-18 DIAGNOSIS — I10 Essential (primary) hypertension: Secondary | ICD-10-CM | POA: Diagnosis not present

## 2021-10-18 DIAGNOSIS — J449 Chronic obstructive pulmonary disease, unspecified: Secondary | ICD-10-CM | POA: Diagnosis not present

## 2021-10-18 DIAGNOSIS — M810 Age-related osteoporosis without current pathological fracture: Secondary | ICD-10-CM | POA: Diagnosis not present

## 2021-10-19 DIAGNOSIS — N183 Chronic kidney disease, stage 3 unspecified: Secondary | ICD-10-CM | POA: Insufficient documentation

## 2021-10-24 ENCOUNTER — Emergency Department: Payer: Medicare Other

## 2021-10-24 ENCOUNTER — Observation Stay
Admission: EM | Admit: 2021-10-24 | Discharge: 2021-10-25 | Disposition: A | Discharge status: Home / Self Care | Payer: Medicare Other | Attending: Internal Medicine | Admitting: Internal Medicine

## 2021-10-24 ENCOUNTER — Other Ambulatory Visit: Payer: Self-pay

## 2021-10-24 DIAGNOSIS — N179 Acute kidney failure, unspecified: Secondary | ICD-10-CM | POA: Insufficient documentation

## 2021-10-24 DIAGNOSIS — J449 Chronic obstructive pulmonary disease, unspecified: Secondary | ICD-10-CM | POA: Diagnosis not present

## 2021-10-24 DIAGNOSIS — J8410 Pulmonary fibrosis, unspecified: Secondary | ICD-10-CM | POA: Diagnosis not present

## 2021-10-24 DIAGNOSIS — I10 Essential (primary) hypertension: Secondary | ICD-10-CM | POA: Insufficient documentation

## 2021-10-24 DIAGNOSIS — J439 Emphysema, unspecified: Secondary | ICD-10-CM | POA: Diagnosis present

## 2021-10-24 DIAGNOSIS — R42 Dizziness and giddiness: Secondary | ICD-10-CM | POA: Diagnosis not present

## 2021-10-24 DIAGNOSIS — I959 Hypotension, unspecified: Secondary | ICD-10-CM | POA: Diagnosis not present

## 2021-10-24 DIAGNOSIS — Z87891 Personal history of nicotine dependence: Secondary | ICD-10-CM

## 2021-10-24 DIAGNOSIS — I951 Orthostatic hypotension: Secondary | ICD-10-CM | POA: Diagnosis not present

## 2021-10-24 DIAGNOSIS — K219 Gastro-esophageal reflux disease without esophagitis: Secondary | ICD-10-CM

## 2021-10-24 DIAGNOSIS — E871 Hypo-osmolality and hyponatremia: Secondary | ICD-10-CM | POA: Diagnosis present

## 2021-10-24 DIAGNOSIS — E876 Hypokalemia: Secondary | ICD-10-CM | POA: Diagnosis not present

## 2021-10-24 DIAGNOSIS — R778 Other specified abnormalities of plasma proteins: Secondary | ICD-10-CM | POA: Insufficient documentation

## 2021-10-24 DIAGNOSIS — R531 Weakness: Secondary | ICD-10-CM

## 2021-10-24 DIAGNOSIS — Z20822 Contact with and (suspected) exposure to covid-19: Secondary | ICD-10-CM | POA: Diagnosis not present

## 2021-10-24 DIAGNOSIS — R7989 Other specified abnormal findings of blood chemistry: Secondary | ICD-10-CM | POA: Diagnosis present

## 2021-10-24 LAB — COMPREHENSIVE METABOLIC PANEL
ALT: 12 U/L (ref 0–44)
AST: 20 U/L (ref 15–41)
Albumin: 4.1 g/dL (ref 3.5–5.0)
Alkaline Phosphatase: 48 U/L (ref 38–126)
Anion gap: 10 (ref 5–15)
BUN: 24 mg/dL — ABNORMAL HIGH (ref 8–23)
CO2: 29 mmol/L (ref 22–32)
Calcium: 8.7 mg/dL — ABNORMAL LOW (ref 8.9–10.3)
Chloride: 87 mmol/L — ABNORMAL LOW (ref 98–111)
Creatinine, Ser: 1.48 mg/dL — ABNORMAL HIGH (ref 0.44–1.00)
GFR, Estimated: 35 mL/min — ABNORMAL LOW (ref >60)
Glucose, Bld: 134 mg/dL — ABNORMAL HIGH (ref 70–99)
Potassium: 2.5 mmol/L — CL (ref 3.5–5.1)
Sodium: 126 mmol/L — ABNORMAL LOW (ref 135–145)
Total Bilirubin: 1 mg/dL (ref 0.3–1.2)
Total Protein: 6.6 g/dL (ref 6.5–8.1)

## 2021-10-24 LAB — CBC WITH DIFFERENTIAL/PLATELET
Abs Immature Granulocytes: 0.06 10*3/uL (ref 0.00–0.07)
Basophils Absolute: 0 10*3/uL (ref 0.0–0.1)
Basophils Relative: 0 %
Eosinophils Absolute: 0.2 10*3/uL (ref 0.0–0.5)
Eosinophils Relative: 2 %
HCT: 41.6 % (ref 36.0–46.0)
Hemoglobin: 14.3 g/dL (ref 12.0–15.0)
Immature Granulocytes: 1 %
Lymphocytes Relative: 13 %
Lymphs Abs: 1.3 10*3/uL (ref 0.7–4.0)
MCH: 31.6 pg (ref 26.0–34.0)
MCHC: 34.4 g/dL (ref 30.0–36.0)
MCV: 91.8 fL (ref 80.0–100.0)
Monocytes Absolute: 1.1 10*3/uL — ABNORMAL HIGH (ref 0.1–1.0)
Monocytes Relative: 11 %
Neutro Abs: 7.3 10*3/uL (ref 1.7–7.7)
Neutrophils Relative %: 73 %
Platelets: 252 10*3/uL (ref 150–400)
RBC: 4.53 MIL/uL (ref 3.87–5.11)
RDW: 12.2 % (ref 11.5–15.5)
WBC: 9.9 10*3/uL (ref 4.0–10.5)
nRBC: 0 % (ref 0.0–0.2)

## 2021-10-24 LAB — URINALYSIS, ROUTINE W REFLEX MICROSCOPIC
Bilirubin Urine: NEGATIVE
Glucose, UA: NEGATIVE mg/dL
Hgb urine dipstick: NEGATIVE
Ketones, ur: NEGATIVE mg/dL
Leukocytes,Ua: NEGATIVE
Nitrite: NEGATIVE
Protein, ur: NEGATIVE mg/dL
Specific Gravity, Urine: 1.006 (ref 1.005–1.030)
pH: 5 (ref 5.0–8.0)

## 2021-10-24 LAB — RESP PANEL BY RT-PCR (FLU A&B, COVID) ARPGX2
Influenza A by PCR: NEGATIVE
Influenza B by PCR: NEGATIVE
SARS Coronavirus 2 by RT PCR: NEGATIVE

## 2021-10-24 LAB — TROPONIN I (HIGH SENSITIVITY): Troponin I (High Sensitivity): 164 ng/L (ref <18)

## 2021-10-24 MED ORDER — ACETAMINOPHEN 650 MG RE SUPP: 650.0000 mg | Freq: Four times a day (QID) | RECTAL | Status: DC | PRN

## 2021-10-24 MED ORDER — HEPARIN BOLUS VIA INFUSION
3700.0000 [IU] | Freq: Once | INTRAVENOUS | Status: AC
  Administered 2021-10-25: 3700 [IU] via INTRAVENOUS
  Filled 2021-10-24: qty 3700

## 2021-10-24 MED ORDER — ONDANSETRON HCL 4 MG/2ML IJ SOLN: 4.0000 mg | Freq: Four times a day (QID) | INTRAMUSCULAR | Status: DC | PRN

## 2021-10-24 MED ORDER — PANTOPRAZOLE SODIUM 40 MG PO TBEC
40.0000 mg | DELAYED_RELEASE_TABLET | Freq: Every day | ORAL | Status: DC
  Administered 2021-10-25: 40 mg via ORAL
  Filled 2021-10-24: qty 1

## 2021-10-24 MED ORDER — MAGNESIUM SULFATE IN D5W 1-5 GM/100ML-% IV SOLN
1.0000 g | Freq: Once | INTRAVENOUS | Status: AC
  Administered 2021-10-25: 1 g via INTRAVENOUS
  Filled 2021-10-24: qty 100

## 2021-10-24 MED ORDER — HEPARIN SODIUM (PORCINE) 5000 UNIT/ML IJ SOLN
5000.0000 [IU] | Freq: Three times a day (TID) | INTRAMUSCULAR | Status: DC
  Filled 2021-10-24: qty 1

## 2021-10-24 MED ORDER — POTASSIUM CHLORIDE CRYS ER 20 MEQ PO TBCR
40.0000 meq | EXTENDED_RELEASE_TABLET | Freq: Once | ORAL | Status: AC
  Administered 2021-10-24: 40 meq via ORAL
  Filled 2021-10-24: qty 2

## 2021-10-24 MED ORDER — SODIUM CHLORIDE 0.9 % IV BOLUS
1000.0000 mL | Freq: Once | INTRAVENOUS | Status: AC
  Administered 2021-10-24: 1000 mL via INTRAVENOUS

## 2021-10-24 MED ORDER — ACETAMINOPHEN 325 MG PO TABS: 650.0000 mg | ORAL_TABLET | Freq: Four times a day (QID) | ORAL | Status: DC | PRN

## 2021-10-24 MED ORDER — ONDANSETRON HCL 4 MG PO TABS: 4.0000 mg | ORAL_TABLET | Freq: Four times a day (QID) | ORAL | Status: DC | PRN

## 2021-10-24 MED ORDER — POTASSIUM CHLORIDE 10 MEQ/100ML IV SOLN
10.0000 meq | Freq: Once | INTRAVENOUS | Status: AC
  Administered 2021-10-24: 10 meq via INTRAVENOUS
  Filled 2021-10-24: qty 100

## 2021-10-24 MED ORDER — HEPARIN (PORCINE) 25000 UT/250ML-% IV SOLN
800.0000 [IU]/h | INTRAVENOUS | Status: DC
  Administered 2021-10-25: 800 [IU]/h via INTRAVENOUS
  Filled 2021-10-24: qty 250

## 2021-10-24 NOTE — Progress Notes (Signed)
ANTICOAGULATION CONSULT NOTE  Pharmacy Consult for heparin infusion Indication: NSTEMI  Allergies  Allergen Reactions   Sulfa Antibiotics Rash    Patient Measurements: Height: 5\' 5"  (165.1 cm) Weight: 61.2 kg (135 lb) IBW/kg (Calculated) : 57 Heparin Dosing Weight: 61.2 kg  Vital Signs: Temp: 98.2 F (36.8 C) (01/11 1826) Temp Source: Oral (01/11 1826) BP: 126/74 (01/11 2200) Pulse Rate: 70 (01/11 2200)  Labs: Recent Labs    10/24/21 2102 10/24/21 2227  HGB 14.3  --   HCT 41.6  --   PLT 252  --   CREATININE 1.48*  --   TROPONINIHS  --  164*    Estimated Creatinine Clearance: 25.5 mL/min (A) (by C-G formula based on SCr of 1.48 mg/dL (H)).   Medical History: Past Medical History:  Diagnosis Date   Cholelithiasis    Chronic diarrhea    Colon polyp    Diverticulosis    GERD (gastroesophageal reflux disease)    if she eats acidic foods   Hypertension    Mucoid cyst of joint    left index finger   Osteoporosis     Assessment: Pt is 87 yo female presenting to ED at the advice of PCP for electrolyte imbalance found with elevated Troponin I.  Goal of Therapy:  Heparin level 0.3-0.7 units/ml Monitor platelets by anticoagulation protocol: Yes   Plan:  Bolus 3700 units x 1 Start heparin infusion at 800 units/hr Check HL in 8 hrs after start of infusion CBC daily while on heparin  Renda Rolls, PharmD, [facility] 10/24/2021 11:34 PM

## 2021-10-24 NOTE — ED Notes (Signed)
Pt ambulated to the restroom with standby assist.

## 2021-10-24 NOTE — ED Provider Notes (Signed)
[facility] Provider Note  Patient Contact: 8:35 PM (approximate)   History   No chief complaint on file.   HPI  Morgan Sandoval is a 87 y.o. female who presents the emergency department for evaluation of abnormal labs.  Patient was recently seen in a new primary care, he took her off of her normal blood pressure medication, put on HCTZ and lisinopril.  Patient states that yesterday she became hypotensive, lightheaded, dizzy and near syncopal.  Her blood pressure remained low this morning and EMS was called.  She refused transport at the time as she was feeling better and then proceeded to her primary care.  She was seen by another provider in the office, told to stop her medications and watch her blood pressure.  Patient had labs which returned with a sodium of 131 and potassium 2.7.  She currently feels her normal self.  But she received a call from her primary care referring her to the emergency department for evaluation.  Denies headache, visual changes, weakness, slurred speech, chest pain, shortness of breath at this time.     Physical Exam   Triage Vital Signs: ED Triage Vitals [10/24/21 1826]  Enc Vitals Group     BP (!) 101/58     Pulse Rate 92     Resp 20     Temp 98.2 F (36.8 C)     Temp Source Oral     SpO2 94 %     Weight 135 lb (61.2 kg)     Height 5\' 5"  (1.651 m)     Head Circumference      Peak Flow      Pain Score 0     Pain Loc      Pain Edu?      Excl. in [place]?     Most recent vital signs: Vitals:   10/24/21 2130 10/24/21 2200  BP: 136/70 126/74  Pulse: 69 70  Resp: 16 18  Temp:    SpO2: 95% 100%     General: Alert and in no acute distress. Eyes:  PERRL. EOMI.   Cardiovascular:  Good peripheral perfusion Respiratory: Normal respiratory effort without tachypnea or retractions. Lungs CTAB. Good air entry to the bases with no decreased or absent breath sounds Musculoskeletal: Full range of motion to all extremities.   Neurologic:  No gross focal neurologic deficits are appreciated.  Cranial nerves grossly intact at this time.  Equal grip strength in the upper extremities. Skin:   No rash noted Other:   ED Results / Procedures / Treatments   Labs (all labs ordered are listed, but only abnormal results are displayed) Labs Reviewed  COMPREHENSIVE METABOLIC PANEL - Abnormal; Notable for the following components:      Result Value   Sodium 126 (*)    Potassium 2.5 (*)    Chloride 87 (*)    Glucose, Bld 134 (*)    BUN 24 (*)    Creatinine, Ser 1.48 (*)    Calcium 8.7 (*)    GFR, Estimated 35 (*)    All other components within normal limits  CBC WITH DIFFERENTIAL/PLATELET - Abnormal; Notable for the following components:   Monocytes Absolute 1.1 (*)    All other components within normal limits  URINALYSIS, ROUTINE W REFLEX MICROSCOPIC - Abnormal; Notable for the following components:   Color, Urine YELLOW (*)    APPearance CLEAR (*)    All other components within normal limits  TROPONIN I (HIGH SENSITIVITY) -  Abnormal; Notable for the following components:   Troponin I (High Sensitivity) 164 (*)    All other components within normal limits  RESP PANEL BY RT-PCR (FLU A&B, COVID) ARPGX2  MAGNESIUM  BASIC METABOLIC PANEL  CBC  MAGNESIUM  PHOSPHORUS  APTT  PROTIME-INR  TROPONIN I (HIGH SENSITIVITY)     EKG  ED ECG REPORT I, Charline Bills Shannon Balthazar,  personally viewed and interpreted this ECG.   Date: 10/24/2021  EKG Time: 1832 hrs.  Rate: 77 bpm  Rhythm: Compared with previous EKG from 05/27/2021 patient is no longer in A. fib.  Currently in normal sinus rhythm, occasional PAC  Axis: Rightward axis  Intervals:none  ST&T Change: No ST elevation or depression noted  Patient in normal sinus rhythm with occasional PAC.  Patient's last EKG from 05/27/2021 revealed that the patient was in A. fib.  No longer in A. fib.  No evidence of STEMI.   RADIOLOGY  I personally viewed and evaluated  these images as part of my medical decision making, as well as reviewing the written report by the radiologist.  ED Provider Interpretation: Chronic changes of pulmonary fibrosis with no acute/new findings  DG Chest 2 View  Result Date: 10/24/2021 CLINICAL DATA:  Weakness with history of pulmonary fibrosis. EXAM: CHEST - 2 VIEW COMPARISON:  Chest CT 05/29/2021, PA and lateral chest 05/27/2021 FINDINGS: The cardiac size is normal. There are calcifications in the aorta and coronary arteries, with a stable mediastinum. No vascular congestion is seen. There is subpleural fibrosis and honeycombing of the lungs with a lower zonal predominance and including a focal fibrotic consolidation in the peripheral right upper lobe. No new infiltrate is seen concerning for pneumonia. Underlying pulmonary overinflation is again shown. No pleural effusion is evident. Osteopenia mild thoracic levoscoliosis. There is a chronic L1 severe compression fracture with retropulsion and kyphoplasty cement. IMPRESSION: Grossly stable radiographic changes of pulmonary fibrosis. No new infiltrate is suspected. Aortic atherosclerosis. Electronically Signed   By: Telford Nab M.D.   On: 10/24/2021 22:58    PROCEDURES:  Critical Care performed: No  Procedures   MEDICATIONS ORDERED IN ED: Medications  magnesium sulfate IVPB 1 g 100 mL (has no administration in time range)  pantoprazole (PROTONIX) EC tablet 40 mg (has no administration in time range)  acetaminophen (TYLENOL) tablet 650 mg (has no administration in time range)    Or  acetaminophen (TYLENOL) suppository 650 mg (has no administration in time range)  ondansetron (ZOFRAN) tablet 4 mg (has no administration in time range)    Or  ondansetron (ZOFRAN) injection 4 mg (has no administration in time range)  heparin bolus via infusion 3,700 Units (has no administration in time range)  heparin ADULT infusion 100 units/mL (25000 units/279mL) (has no administration in time  range)  sodium chloride 0.9 % bolus 1,000 mL (0 mLs Intravenous Stopped 10/24/21 2209)  potassium chloride 10 mEq in 100 mL IVPB (0 mEq Intravenous Stopped 10/24/21 2224)  potassium chloride SA (KLOR-CON M) CR tablet 40 mEq (40 mEq Oral Given 10/24/21 2125)  sodium chloride 0.9 % bolus 1,000 mL (1,000 mLs Intravenous New Bag/Given 10/24/21 2225)     IMPRESSION / MDM / ASSESSMENT AND PLAN / ED COURSE  I reviewed the triage vital signs and the nursing notes.                              Differential diagnosis includes, but is not limited to, hyponatremia,  hypokalemia, hypertensive crisis, hypotension, STEMI, NSTEMI  The patient is on the cardiac monitor to evaluate for evidence of arrhythmia and/or significant heart rate changes.   Patient's diagnosis is consistent with hyponatremia, hypokalemia, AKI, weakness.  Patient presented to the emergency department after having a sudden change yesterday.  Patient woke up yesterday with a blood pressure in the 190s over 130s.  Patient then subsequently developed hypotension with blood pressure in the 80s over 40s.  She states that yesterday she fell off, was having palpitations, extremely weak, near syncopal episode.  Patient was evaluated at her long-term care facility yesterday but declined further evaluation with EMS or in the emergency department.  When she still fell off this morning, she was evaluated by a nursing staff at a lower term care facility as well as EMS who was called.  Patient declined transport by EMS this morning but went and saw her primary care office.  She does not see her primary care provider but saw another provider in the office.  She had been recently placed on different blood pressure medications.  She had been on spironolactone and a beta-blocker and had been stopped with these medications and placed on HCTZ and an ACE inhibitor.  Patient has had less than a week of these doses prior to the onset of her symptoms.  They recommended  stopping her HCTZ, pursuing labs today at her primary care office.  Patient was found to be hypotensive at her office with a blood pressure of 90/50.  Her labs came back with a sodium of 131 and potassium of 2.7, according to her primary care notes in epic, and was referred to the emergency department.  She states that she felt better than she did earlier this morning and definitely better than last night but did feel weak still.  Patient was started on fluids, potassium and I rechecked her labs.  Her sodium has fallen to 126 and her potassium is 2.5.  She also has had a bump in her creatinine from what appears to be 0.9-1 at baseline to 1.5 today.  Given her symptoms, her worsening labs, I felt that patient did require admission.  I discussed the patient with the hospitalist service who agrees to admit the patient at this time.  Patient has received IV and oral potassium replenishment and is currently having her second liter of fluids administered.  Patient care will be transferred to Dr. Tobie Poet with the hospitalist service at this time.Marland Kitchen        FINAL CLINICAL IMPRESSION(S) / ED DIAGNOSES   Final diagnoses:  Hypokalemia  Hyponatremia  AKI (acute kidney injury) ([place])  Weakness     Rx / DC Orders   ED Discharge Orders     None        Note:  This document was prepared using Dragon voice recognition software and may include unintentional dictation errors.   Brynda Peon 10/25/21 0002    Blake Divine, MD 10/25/21 [phone]

## 2021-10-24 NOTE — ED Notes (Signed)
Dr. Cox at bedside.  

## 2021-10-24 NOTE — H&P (Addendum)
History and Physical   Morgan Sandoval QBH:[id number] DOB: 07/30/1935 DOA: 10/24/2021  PCP: [facility]  Outpatient Specialists: Dr. Ephriam Jenkins, [facility] rheumatology; Dr. Lanney Gins, pulmonology Patient coming from: home  I have personally briefly reviewed patient's old medical records in [place].  Chief Concern: Weakness and electrolyte imbalance  HPI: Morgan Sandoval is a 87 y.o. female with medical history significant for Rheumatoid arthritis involving multiple sites with positive rheumatoid factor, hypertension, COPD with chronic bronchitis, spinal stenosis of the lumbar region, history of tobacco abuse, osteoporosis, GERD, who presents emergency department at the advice of PCP for electrolyte imbalance.  At bedside she is able to tell me her name, age, current location, and her neighbor, [name] who is at bedside with patients permission.  She reports initially that she was feeling fine and only presented to the emergency department due to her primary care doctor urging her due to electrolyte imbalance.  However upon further evaluation and discussion, she did endorse that she was experiencing dizziness that started on 10/23/2021, which then prompted her to make an appointment with her PCP for evaluation. Furthermore, she endorsed weakness and heart palpitation on 10/23/21.   Her PCP office then ordered labs which showed abnormal electrolyte imbalance.  She was then advised to present to the emergency department for further evaluation.  She was prescribed hydrochlorothiazide on 10/18/2021.   She started HCTZ on the 8th, 9th, 10.  She was then prescribed additional lisinopril, which she took on the 10th in the AM. Patient endorses compliance and started taking this new blood pressure medication and has been developing generalized weakness.   Of note: Patient has been a longtime user of bisoprolol and spironolactone.  PCP noted that patient does not have a history of CAD or  congestive heart failure, no history of tachycardia, therefore new PCP replace patient's medication with hydrochlorothiazide 25 mg daily.  In addition, she has been experiencing swings of increased blood pressure over the last few days.she states that at one point her blood pressure was in the 180s.  At baseline she has shortness of breath with exertion and this has been going on since August 2022. She was diagnosed with pulmonary fibrosis in August 2022.   Social history: She lives by herself, [place]. She is a former tobacco user from [place]. She drinks 1 glass of wine 4x per week. She denies history of drug use. She is retired and formerly worked as a Chief Strategy Officer.   Vaccination history: She is vaccinated for covid 19, total of 4 doses.   ROS: Constitutional: no weight change, no fever ENT/Mouth: no sore throat, no rhinorrhea Eyes: no eye pain, no vision changes Cardiovascular: no chest pain, + dyspnea,  no edema, no palpitations Respiratory: no cough, no sputum, no wheezing Gastrointestinal: no nausea, no vomiting, no diarrhea, no constipation Genitourinary: no urinary incontinence, no dysuria, no hematuria Musculoskeletal: no arthralgias, no myalgias Skin: no skin lesions, no pruritus, Neuro: + weakness, no loss of consciousness, no syncope Psych: no anxiety, no depression, + decrease appetite Heme/Lymph: no bruising, no bleeding  ED Course: Discussed with emergency medicine provider, patient requiring hospitalization for chief concerns of electrolyte imbalance.  Vitals in the emergency department showed temperature of 98.2, respiration rate of 20, heart rate of 92, blood pressure 101/58 and improved to 144/73, SPO2 94% on room air.  Serum sodium is 126, potassium 2.5, chloride 87, bicarb 29, BUN of 24, serum creatinine of 1.48, nonfasting blood glucose  134, GFR 35, WBC 9.9, hemoglobin 14.3, platelets of 252.  In the emergency department patient has received potassium  40 mill equivalent p.o., potassium chloride 10 mill equivalent IV once, normal sodium 2 L bolus.  Assessment/Plan  Principal Problem:   Hypokalemia Active Problems:   History of tobacco use   COPD with chronic bronchitis (HCC)   Emphysema lung (HCC)   Hyponatremia   Weakness   AKI (acute kidney injury) (HCC)   Elevated troponin   Hypomagnesemia   GERD (gastroesophageal reflux disease)   Essential hypertension   # Weakness-presumed secondary to electrolyte imbalance - Check magnesium level - Given the low levels of potassium, I have ordered magnesium 1 g IV. - Check magnesium in the a.m., check B12 - Fall precautions  # Mild hyponatremia and hypochloride query secondary to hydrochlorothiazide use - Serum sodium level on admission was 126 - Sodium level on 10/18/2021 labs was 138 in care everywhere - Status post 2 L bolus of sodium chloride per EDP  # Hypokalemia-query secondary to hydrochlorothiazide, hold hydrochlorothiazide at this time # Hypomagnesemia - Check magnesium stat - Give magnesium 1 g IV one-time dose - Check magnesium in the a.m.  # Elevated troponin -Heparin GTT started - Low clinical suspicion for ACS - Patient denies chest pain and there is no ischemic EKG changes - I suspect this is secondary to elevated hypertension in setting of recent antihypertensive medication changes - Second troponin is in process - If patient develops symptoms and/or positive delta, would recommend a.m. team to consult cardiology for further evaluation  # Acute kidney injury-no prior CKD - Serum creatinine on presentation is 1.48 and it was 1.5 outpatient on day of admission - Baseline serum creatinine is 1.1-1.2 - Elevation in serum creatinine level may be secondary to starting ACE inhibitor complicated by hydrochlorothiazide - Holding home hydrochlorothiazide and lisinopril at this time - BMP in the a.m.  # Pulmonary fibrosis-patient is stable at this time, not in  exacerbation - Incentive spirometry and flutter valve ordered - Continue follow-up with outpatient pulmonologist  # History of hypertension-holding home antihypertensive medications at this time - Labetalol 5 mg IV every 2 hours as needed for SBP greater than 180, 4 doses ordered - Patient did endorse to me that she used to take lisinopril without any difficulty - Patient would benefit from reevaluation of antihypertensive medications, specifically hydrochlorothiazide - Given that patient has had many years of being on bisoprolol and spironolactone, would recommend further evaluation outpatient for resumption of antihypertensive medications  # GERD-PPI  A.m. team to complete med reconciliation  Chart reviewed.   DVT prophylaxis: Heparin GTT Code Status: Full code Diet: Heart healthy Family Communication: Updated friend/neighbor at bedside with patient's permission Disposition Plan: Pending clinical course Consults called: None at this time Admission status: Telemetry cardiac, observation  Past Medical History:  Diagnosis Date   Cholelithiasis    Chronic diarrhea    Colon polyp    Diverticulosis    GERD (gastroesophageal reflux disease)    if she eats acidic foods   Hypertension    Mucoid cyst of joint    left index finger   Osteoporosis    Past Surgical History:  Procedure Laterality Date   APPENDECTOMY     BIOPSY  05/19/2019   Procedure: BIOPSY;  Surgeon: Rogene Houston, MD;  [facility];  Service: Endoscopy;;  sigmoid colon   CHOLECYSTECTOMY     COLON SURGERY     Removed large polyps  COLONOSCOPY     COLONOSCOPY N/A 05/18/2015   Procedure: COLONOSCOPY;  Surgeon: Rogene Houston, MD;  [facility]: [facility];  Service: Endoscopy;  Laterality: N/A;  1030   COLONOSCOPY N/A 05/19/2019   Procedure: COLONOSCOPY;  Surgeon: Rogene Houston, MD;  [facility]: [facility];  Service: Endoscopy;  Laterality: N/A;  8:30   GANGLION CYST EXCISION Left 03/2019   the fore  finger   IR VERTEBROPLASTY LUMBAR BX INC UNI/BIL INC/INJECT/IMAGING  07/27/2020   MASS EXCISION Left 03/02/2019   Procedure: EXCISION CYST, DEBRIDEMENT PROXIMAL INTERPHALANGEAL JOINT LEFT INDEX FINGER;  Surgeon: Daryll Brod, MD;  Location: [place];  Service: Orthopedics;  Laterality: Left;   RECTAL SURGERY  2000s   [facility]   TONSILLECTOMY     Social History:  reports that she has quit smoking. She has never used smokeless tobacco. She reports current alcohol use. She reports that she does not use drugs.  Allergies  Allergen Reactions   Sulfa Antibiotics Rash   Family History  Problem Relation Age of Onset   Cancer - Colon Brother    Family history: Family history reviewed and not pertinent.  Prior to Admission medications   Medication Sig Start Date End Date Taking? Authorizing Provider  alendronate (FOSAMAX) 70 MG tablet Take 70 mg by mouth every Sunday.    [provider]  benzonatate (TESSALON) 100 MG capsule Take 1 capsule (100 mg total) by mouth 3 (three) times daily. 05/30/21   Fritzi Mandes, MD  cholecalciferol (VITAMIN D3) 25 MCG (1000 UNIT) tablet Take 1,000 Units by mouth daily.    [provider]  Cyanocobalamin (B-12) 5000 MCG CAPS Take 5,000 mcg by mouth daily.    [provider]  diclofenac sodium (VOLTAREN) 1 % GEL Apply 2-4 g topically 2 (two) times daily as needed (hip pain).    [provider]  Multiple Vitamins-Minerals (MULTIVITAMIN WITH MINERALS) tablet Take 1 tablet by mouth daily.    [provider]  nebivolol (BYSTOLIC) 10 MG tablet Take 10 mg by mouth daily.    [provider]  pantoprazole (PROTONIX) 40 MG tablet Take 40 mg by mouth daily.    [provider]  predniSONE (DELTASONE) 10 MG tablet Take 10 mg by mouth daily. 06/07/21   [provider]  spironolactone (ALDACTONE) 25 MG tablet Take 25 mg by mouth daily.    [provider]   Physical Exam: Vitals:    10/24/21 1826 10/24/21 2130 10/24/21 2200  BP: (!) 101/58 136/70 126/74  Pulse: 92 69 70  Resp: 20 16 18   Temp: 98.2 F (36.8 C)    TempSrc: Oral    SpO2: 94% 95% 100%  Weight: 61.2 kg    Height: 5\' 5"  (1.651 m)     Constitutional: appears younger than chronological age, frail, NAD, calm, comfortable Eyes: PERRL, lids and conjunctivae normal ENMT: Mucous membranes are moist. Posterior pharynx clear of any exudate or lesions. Age-appropriate dentition.  Mild to moderate hearing loss Neck: normal, supple, no masses, no thyromegaly Respiratory: clear to auscultation bilaterally, no wheezing, no crackles. Normal respiratory effort. No accessory muscle use.  Cardiovascular: Regular rate and rhythm, no murmurs / rubs / gallops. No extremity edema. 2+ pedal pulses. No carotid bruits.  Abdomen: no tenderness, no masses palpated, no hepatosplenomegaly. Bowel sounds positive.  Musculoskeletal: no clubbing / cyanosis. No joint deformity upper and lower extremities. Good ROM, no contractures, no atrophy. Normal muscle tone.  Skin: no rashes, lesions, ulcers. No  induration Neurologic: Sensation intact. Strength 5/5 in all 4.  Psychiatric: Normal judgment and insight. Alert and oriented x 3. Normal mood.   EKG: independently reviewed, showing sinus rhythm with rate of 77, QTc 470.  Chest x-ray on Admission: I personally reviewed and I agree with radiologist reading as below.  DG Chest 2 View  Result Date: 10/24/2021 CLINICAL DATA:  Weakness with history of pulmonary fibrosis. EXAM: CHEST - 2 VIEW COMPARISON:  Chest CT 05/29/2021, PA and lateral chest 05/27/2021 FINDINGS: The cardiac size is normal. There are calcifications in the aorta and coronary arteries, with a stable mediastinum. No vascular congestion is seen. There is subpleural fibrosis and honeycombing of the lungs with a lower zonal predominance and including a focal fibrotic consolidation in the peripheral right upper lobe. No new  infiltrate is seen concerning for pneumonia. Underlying pulmonary overinflation is again shown. No pleural effusion is evident. Osteopenia mild thoracic levoscoliosis. There is a chronic L1 severe compression fracture with retropulsion and kyphoplasty cement. IMPRESSION: Grossly stable radiographic changes of pulmonary fibrosis. No new infiltrate is suspected. Aortic atherosclerosis. Electronically Signed   By: Telford Nab M.D.   On: 10/24/2021 22:58    Labs on Admission: I have personally reviewed following labs  CBC: Recent Labs  Lab 10/24/21 2102  WBC 9.9  NEUTROABS 7.3  HGB 14.3  HCT 41.6  MCV 91.8  PLT 863   Basic Metabolic Panel: Recent Labs  Lab 10/24/21 2102 10/25/21 0032  NA 126*  --   K 2.5*  --   CL 87*  --   CO2 29  --   GLUCOSE 134*  --   BUN 24*  --   CREATININE 1.48*  --   CALCIUM 8.7*  --   MG  --  1.6*   GFR: Estimated Creatinine Clearance: 25.5 mL/min (A) (by C-G formula based on SCr of 1.48 mg/dL (H)).  Liver Function Tests: Recent Labs  Lab 10/24/21 2102  AST 20  ALT 12  ALKPHOS 48  BILITOT 1.0  PROT 6.6  ALBUMIN 4.1   Urine analysis:    Component Value Date/Time   COLORURINE YELLOW (A) 10/24/2021 2227   APPEARANCEUR CLEAR (A) 10/24/2021 2227   LABSPEC 1.006 10/24/2021 2227   PHURINE 5.0 10/24/2021 2227   GLUCOSEU NEGATIVE 10/24/2021 2227   HGBUR NEGATIVE 10/24/2021 2227   BILIRUBINUR NEGATIVE 10/24/2021 2227   KETONESUR NEGATIVE 10/24/2021 2227   PROTEINUR NEGATIVE 10/24/2021 2227   NITRITE NEGATIVE 10/24/2021 2227   LEUKOCYTESUR NEGATIVE 10/24/2021 2227   Dr. Tobie Poet [facility]  If 7PM-7AM, please contact overnight-coverage provider If 7AM-7PM, please contact day coverage provider [email or web address]  10/25/2021, 1:08 AM

## 2021-10-24 NOTE — ED Triage Notes (Addendum)
Pt states here for abnormal sodium and potassium.sodium 131, potassium 2.7. visible in care everywhere. Recently stopped spironolactone.  Denies complaints Ambulatory steady gait with walker.

## 2021-10-25 DIAGNOSIS — I10 Essential (primary) hypertension: Secondary | ICD-10-CM

## 2021-10-25 DIAGNOSIS — E876 Hypokalemia: Secondary | ICD-10-CM | POA: Diagnosis not present

## 2021-10-25 DIAGNOSIS — K219 Gastro-esophageal reflux disease without esophagitis: Secondary | ICD-10-CM

## 2021-10-25 DIAGNOSIS — R778 Other specified abnormalities of plasma proteins: Secondary | ICD-10-CM | POA: Diagnosis present

## 2021-10-25 LAB — BASIC METABOLIC PANEL
Anion gap: 6 (ref 5–15)
BUN: 18 mg/dL (ref 8–23)
CO2: 27 mmol/L (ref 22–32)
Calcium: 8 mg/dL — ABNORMAL LOW (ref 8.9–10.3)
Chloride: 100 mmol/L (ref 98–111)
Creatinine, Ser: 0.98 mg/dL (ref 0.44–1.00)
GFR, Estimated: 57 mL/min — ABNORMAL LOW (ref >60)
Glucose, Bld: 88 mg/dL (ref 70–99)
Potassium: 3.4 mmol/L — ABNORMAL LOW (ref 3.5–5.1)
Sodium: 133 mmol/L — ABNORMAL LOW (ref 135–145)

## 2021-10-25 LAB — APTT: aPTT: 30 seconds (ref 24–36)

## 2021-10-25 LAB — PROTIME-INR
INR: 1.1 (ref 0.8–1.2)
Prothrombin Time: 14.4 seconds (ref 11.4–15.2)

## 2021-10-25 LAB — MAGNESIUM
Magnesium: 1.6 mg/dL — ABNORMAL LOW (ref 1.7–2.4)
Magnesium: 2 mg/dL (ref 1.7–2.4)

## 2021-10-25 LAB — TROPONIN I (HIGH SENSITIVITY): Troponin I (High Sensitivity): 150 ng/L (ref <18)

## 2021-10-25 LAB — PHOSPHORUS: Phosphorus: 2.6 mg/dL (ref 2.5–4.6)

## 2021-10-25 LAB — HEPARIN LEVEL (UNFRACTIONATED): Heparin Unfractionated: 0.62 IU/mL (ref 0.30–0.70)

## 2021-10-25 LAB — VITAMIN B12: Vitamin B-12: 3117 pg/mL — ABNORMAL HIGH (ref 180–914)

## 2021-10-25 MED ORDER — POTASSIUM CHLORIDE 10 MEQ/100ML IV SOLN
10.0000 meq | INTRAVENOUS | Status: AC
  Administered 2021-10-25 (×3): 10 meq via INTRAVENOUS
  Filled 2021-10-25 (×3): qty 100

## 2021-10-25 MED ORDER — LISINOPRIL 10 MG PO TABS: 10.0000 mg | ORAL_TABLET | Freq: Every day | ORAL | 0 refills | Status: DC

## 2021-10-25 MED ORDER — POTASSIUM CHLORIDE CRYS ER 20 MEQ PO TBCR
40.0000 meq | EXTENDED_RELEASE_TABLET | Freq: Once | ORAL | Status: AC
  Administered 2021-10-25: 40 meq via ORAL
  Filled 2021-10-25: qty 2

## 2021-10-25 MED ORDER — LABETALOL HCL 5 MG/ML IV SOLN: 5.0000 mg | INTRAVENOUS | Status: DC | PRN

## 2021-10-25 NOTE — Discharge Summary (Signed)
Physician Discharge Summary  Morgan Sandoval AST:[id number] DOB: 1935-07-20 DOA: 10/24/2021  PCP: Leon date: 10/24/2021 Discharge date: 10/25/2021  Admitted From: Home Disposition:  Home  Recommendations for Outpatient Follow-up:  Follow up with PCP in 1 week   Home Health:No  Equipment/Devices:None   Discharge Condition:Stable  CODE STATUS:Full  Diet recommendation: Regular  Brief/Interim Summary: 87 y.o. female with medical history significant for Rheumatoid arthritis involving multiple sites with positive rheumatoid factor, hypertension, COPD with chronic bronchitis, spinal stenosis of the lumbar region, history of tobacco abuse, osteoporosis, GERD, who presents emergency department at the advice of PCP for electrolyte imbalance.   At bedside she is able to tell me her name, age, current location, and her neighbor, [name] who is at bedside with patients permission.   She reports initially that she was feeling fine and only presented to the emergency department due to her primary care doctor urging her due to electrolyte imbalance.  However upon further evaluation and discussion, she did endorse that she was experiencing dizziness that started on 10/23/2021, which then prompted her to make an appointment with her PCP for evaluation. Furthermore, she endorsed weakness and heart palpitation on 10/23/21.    Her PCP office then ordered labs which showed abnormal electrolyte imbalance.  She was then advised to present to the emergency department for further evaluation.  Admitted for symptomatic hyponatremia.  Likely secondary to hydrochlorothiazide use.  Hydrochlorothiazide was held and patient was hydrated with isotonic normal saline.  Sodium returned to reference level at time of discharge.  Dizziness and unsteadiness had resolved.  Patient was seen in consultation by physical therapy.  Patient ambulated without difficulty.  No therapy follow-up recommendations  were indicated.  Patient was discharged in stable condition.  At time of discharge I recommend discontinuation of hydrochlorothiazide.  Treatment of hypertension with lisinopril monotherapy for now.  Patient encouraged to check her blood pressure at home and discussed with her primary care physician regarding antihypertensive strategy.    Discharge Diagnoses:  Principal Problem:   Hypokalemia Active Problems:   History of tobacco use   COPD with chronic bronchitis (HCC)   Emphysema lung (HCC)   Hyponatremia   Weakness   AKI (acute kidney injury) (HCC)   Elevated troponin   Hypomagnesemia   GERD (gastroesophageal reflux disease)   Essential hypertension  Symptomatic hyponatremia Weakness secondary to above Suspect secondary to hydrochlorothiazide use.  HCTZ held on admission.  Patient hydrated with normal saline.  Sodium level returned to baseline.  Weakness had resolved.  Patient ambulated without difficulty at time of discharge.  Stable for discharge.  Will discontinue hydrochlorothiazide.  Recommend lisinopril monotherapy for 2 days following discharge.  Follow-up with PCP regarding antihypertensive strategy moving forward.  Elevated troponin High-sensitivity troponin peaked at 150.  Low suspicion for ACS.  Heparin GTT stopped.  Follow-up outpatient PCP.  Discharge Instructions  Discharge Instructions     Diet - low sodium heart healthy   Complete by: As directed    Increase activity slowly   Complete by: As directed       Allergies as of 10/25/2021       Reactions   Sulfa Antibiotics Rash, Hives        Medication List     STOP taking these medications    benzonatate 100 MG capsule Commonly known as: TESSALON   bisoprolol 5 MG tablet Commonly known as: ZEBETA   hydrochlorothiazide 25 MG tablet Commonly known as: HYDRODIURIL   hydroxychloroquine  200 MG tablet Commonly known as: PLAQUENIL   lisinopril-hydrochlorothiazide 10-12.5 MG tablet Commonly known  as: ZESTORETIC   nebivolol 10 MG tablet Commonly known as: BYSTOLIC   predniSONE 10 MG tablet Commonly known as: DELTASONE   spironolactone 25 MG tablet Commonly known as: ALDACTONE       TAKE these medications    albuterol 108 (90 Base) MCG/ACT inhaler Commonly known as: VENTOLIN HFA Inhale 2 puffs into the lungs every 6 (six) hours as needed for wheezing.   alendronate 70 MG tablet Commonly known as: FOSAMAX Take 70 mg by mouth every Sunday.   B-12 5000 MCG Caps Take 5,000 mcg by mouth daily.   Calcium Carb-Cholecalciferol 600-10 MG-MCG Tabs Take 1 tablet by mouth daily.   cholecalciferol 25 MCG (1000 UNIT) tablet Commonly known as: VITAMIN D3 Take 1,000 Units by mouth daily.   diclofenac sodium 1 % Gel Commonly known as: VOLTAREN Apply 2 g topically 4 (four) times daily.   lisinopril 10 MG tablet Commonly known as: ZESTRIL Take 1 tablet (10 mg total) by mouth daily.   multivitamin with minerals tablet Take 1 tablet by mouth daily.   mycophenolate 500 MG tablet Commonly known as: CELLCEPT Take 500 mg by mouth 2 (two) times daily. Before meals   pantoprazole 40 MG tablet Commonly known as: PROTONIX Take 40 mg by mouth daily.        Allergies  Allergen Reactions   Sulfa Antibiotics Rash and Hives    Consultations: None   Procedures/Studies: DG Chest 2 View  Result Date: 10/24/2021 CLINICAL DATA:  Weakness with history of pulmonary fibrosis. EXAM: CHEST - 2 VIEW COMPARISON:  Chest CT 05/29/2021, PA and lateral chest 05/27/2021 FINDINGS: The cardiac size is normal. There are calcifications in the aorta and coronary arteries, with a stable mediastinum. No vascular congestion is seen. There is subpleural fibrosis and honeycombing of the lungs with a lower zonal predominance and including a focal fibrotic consolidation in the peripheral right upper lobe. No new infiltrate is seen concerning for pneumonia. Underlying pulmonary overinflation is again  shown. No pleural effusion is evident. Osteopenia mild thoracic levoscoliosis. There is a chronic L1 severe compression fracture with retropulsion and kyphoplasty cement. IMPRESSION: Grossly stable radiographic changes of pulmonary fibrosis. No new infiltrate is suspected. Aortic atherosclerosis. Electronically Signed   By: Telford Nab M.D.   On: 10/24/2021 22:58      Subjective: Seen and examined the day of discharge.  Stable no distress.  Stable for discharge home.  Discharge Exam: Vitals:   10/25/21 0800 10/25/21 0909  BP: 133/72   Pulse: 83   Resp: 20   Temp:  98 F (36.7 C)  SpO2: 94%    Vitals:   10/25/21 0500 10/25/21 0600 10/25/21 0800 10/25/21 0909  BP: 137/64 127/73 133/72   Pulse: 65 67 83   Resp: 20 19 20    Temp:    98 F (36.7 C)  TempSrc:    Oral  SpO2: 96% 93% 94%   Weight:      Height:        General: Pt is alert, awake, not in acute distress Cardiovascular: RRR, S1/S2 +, no rubs, no gallops Respiratory: CTA bilaterally, no wheezing, no rhonchi Abdominal: Soft, NT, ND, bowel sounds + Extremities: no edema, no cyanosis    The results of significant diagnostics from this hospitalization (including imaging, microbiology, ancillary and laboratory) are listed below for reference.     Microbiology: Recent Results (from the past 240 hour(s))  Resp Panel by RT-PCR (Flu A&B, Covid) Urine, Clean Catch     Status: None   Collection Time: 10/24/21 10:27 PM   Specimen: Urine, Clean Catch; Nasopharyngeal(NP) swabs in vial transport medium  Result Value Ref Range Status   SARS Coronavirus 2 by RT PCR NEGATIVE NEGATIVE Final    Comment: (NOTE) SARS-CoV-2 target nucleic acids are NOT DETECTED.  The SARS-CoV-2 RNA is generally detectable in upper respiratory specimens during the acute phase of infection. The lowest concentration of SARS-CoV-2 viral copies this assay can detect is 138 copies/mL. A negative result does not preclude SARS-Cov-2 infection and should  not be used as the sole basis for treatment or other patient management decisions. A negative result may occur with  improper specimen collection/handling, submission of specimen other than nasopharyngeal swab, presence of viral mutation(s) within the areas targeted by this assay, and inadequate number of viral copies(<138 copies/mL). A negative result must be combined with clinical observations, patient history, and epidemiological information. The expected result is Negative.  Fact Sheet for Patients:  EntrepreneurPulse.com.au  Fact Sheet for Healthcare Providers:  IncredibleEmployment.be  This test is no t yet approved or cleared by the [place] FDA and  has been authorized for detection and/or diagnosis of SARS-CoV-2 by FDA under an Emergency Use Authorization (EUA). This EUA will remain  in effect (meaning this test can be used) for the duration of the COVID-19 declaration under Section 564(b)(1) of the Act, 21 U.S.C.section 360bbb-3(b)(1), unless the authorization is terminated  or revoked sooner.       Influenza A by PCR NEGATIVE NEGATIVE Final   Influenza B by PCR NEGATIVE NEGATIVE Final    Comment: (NOTE) The Xpert Xpress SARS-CoV-2/FLU/RSV plus assay is intended as an aid in the diagnosis of influenza from Nasopharyngeal swab specimens and should not be used as a sole basis for treatment. Nasal washings and aspirates are unacceptable for Xpert Xpress SARS-CoV-2/FLU/RSV testing.  Fact Sheet for Patients: EntrepreneurPulse.com.au  Fact Sheet for Healthcare Providers: IncredibleEmployment.be  This test is not yet approved or cleared by the [place] FDA and has been authorized for detection and/or diagnosis of SARS-CoV-2 by FDA under an Emergency Use Authorization (EUA). This EUA will remain in effect (meaning this test can be used) for the duration of the COVID-19 declaration under  Section 564(b)(1) of the Act, 21 U.S.C. section 360bbb-3(b)(1), unless the authorization is terminated or revoked.  Performed at [facility], [address] [place], [place] [address]      Labs: BNP (last 3 results) No results for input(s): BNP in the last 8760 hours. Basic Metabolic Panel: Recent Labs  Lab 10/24/21 2102 10/25/21 0032 10/25/21 0608  NA 126*  --  133*  K 2.5*  --  3.4*  CL 87*  --  100  CO2 29  --  27  GLUCOSE 134*  --  88  BUN 24*  --  18  CREATININE 1.48*  --  0.98  CALCIUM 8.7*  --  8.0*  MG  --  1.6* 2.0  PHOS  --   --  2.6   Liver Function Tests: Recent Labs  Lab 10/24/21 2102  AST 20  ALT 12  ALKPHOS 48  BILITOT 1.0  PROT 6.6  ALBUMIN 4.1   No results for input(s): LIPASE, AMYLASE in the last 168 hours. No results for input(s): AMMONIA in the last 168 hours. CBC: Recent Labs  Lab 10/24/21 2102  WBC 9.9  NEUTROABS 7.3  HGB 14.3  HCT  41.6  MCV 91.8  PLT 252   Cardiac Enzymes: No results for input(s): CKTOTAL, CKMB, CKMBINDEX, TROPONINI in the last 168 hours. BNP: Invalid input(s): POCBNP CBG: No results for input(s): GLUCAP in the last 168 hours. D-Dimer No results for input(s): DDIMER in the last 72 hours. Hgb A1c No results for input(s): HGBA1C in the last 72 hours. Lipid Profile No results for input(s): CHOL, HDL, LDLCALC, TRIG, CHOLHDL, LDLDIRECT in the last 72 hours. Thyroid function studies No results for input(s): TSH, T4TOTAL, T3FREE, THYROIDAB in the last 72 hours.  Invalid input(s): FREET3 Anemia work up No results for input(s): VITAMINB12, FOLATE, FERRITIN, TIBC, IRON, RETICCTPCT in the last 72 hours. Urinalysis    Component Value Date/Time   COLORURINE YELLOW (A) 10/24/2021 2227   APPEARANCEUR CLEAR (A) 10/24/2021 2227   LABSPEC 1.006 10/24/2021 2227   PHURINE 5.0 10/24/2021 2227   GLUCOSEU NEGATIVE 10/24/2021 2227   HGBUR NEGATIVE 10/24/2021 2227   BILIRUBINUR NEGATIVE 10/24/2021 2227    KETONESUR NEGATIVE 10/24/2021 2227   PROTEINUR NEGATIVE 10/24/2021 2227   NITRITE NEGATIVE 10/24/2021 2227   LEUKOCYTESUR NEGATIVE 10/24/2021 2227   Sepsis Labs Invalid input(s): PROCALCITONIN,  WBC,  LACTICIDVEN Microbiology Recent Results (from the past 240 hour(s))  Resp Panel by RT-PCR (Flu A&B, Covid) Urine, Clean Catch     Status: None   Collection Time: 10/24/21 10:27 PM   Specimen: Urine, Clean Catch; Nasopharyngeal(NP) swabs in vial transport medium  Result Value Ref Range Status   SARS Coronavirus 2 by RT PCR NEGATIVE NEGATIVE Final    Comment: (NOTE) SARS-CoV-2 target nucleic acids are NOT DETECTED.  The SARS-CoV-2 RNA is generally detectable in upper respiratory specimens during the acute phase of infection. The lowest concentration of SARS-CoV-2 viral copies this assay can detect is 138 copies/mL. A negative result does not preclude SARS-Cov-2 infection and should not be used as the sole basis for treatment or other patient management decisions. A negative result may occur with  improper specimen collection/handling, submission of specimen other than nasopharyngeal swab, presence of viral mutation(s) within the areas targeted by this assay, and inadequate number of viral copies(<138 copies/mL). A negative result must be combined with clinical observations, patient history, and epidemiological information. The expected result is Negative.  Fact Sheet for Patients:  EntrepreneurPulse.com.au  Fact Sheet for Healthcare Providers:  IncredibleEmployment.be  This test is no t yet approved or cleared by the [place] FDA and  has been authorized for detection and/or diagnosis of SARS-CoV-2 by FDA under an Emergency Use Authorization (EUA). This EUA will remain  in effect (meaning this test can be used) for the duration of the COVID-19 declaration under Section 564(b)(1) of the Act, 21 U.S.C.section 360bbb-3(b)(1), unless the  authorization is terminated  or revoked sooner.       Influenza A by PCR NEGATIVE NEGATIVE Final   Influenza B by PCR NEGATIVE NEGATIVE Final    Comment: (NOTE) The Xpert Xpress SARS-CoV-2/FLU/RSV plus assay is intended as an aid in the diagnosis of influenza from Nasopharyngeal swab specimens and should not be used as a sole basis for treatment. Nasal washings and aspirates are unacceptable for Xpert Xpress SARS-CoV-2/FLU/RSV testing.  Fact Sheet for Patients: EntrepreneurPulse.com.au  Fact Sheet for Healthcare Providers: IncredibleEmployment.be  This test is not yet approved or cleared by the [place] FDA and has been authorized for detection and/or diagnosis of SARS-CoV-2 by FDA under an Emergency Use Authorization (EUA). This EUA will remain in effect (meaning this test can be used)  for the duration of the COVID-19 declaration under Section 564(b)(1) of the Act, 21 U.S.C. section 360bbb-3(b)(1), unless the authorization is terminated or revoked.  Performed at [facility], [address], [place],  [address]      Time coordinating discharge: Over 30 minutes  SIGNED:   Sidney Ace, MD  [facility] 10/25/2021, 11:41 AM Pager   If 7PM-7AM, please contact night-coverage

## 2021-10-25 NOTE — Progress Notes (Signed)
ANTICOAGULATION CONSULT NOTE  Pharmacy Consult for heparin infusion Indication: NSTEMI  Allergies  Allergen Reactions   Sulfa Antibiotics Rash and Hives    Patient Measurements: Height: 5\' 5"  (165.1 cm) Weight: 61.2 kg (135 lb) IBW/kg (Calculated) : 57 Heparin Dosing Weight: 61.2 kg  Vital Signs: BP: 127/73 (01/12 0600) Pulse Rate: 67 (01/12 0600)  Labs: Recent Labs    10/24/21 2102 10/24/21 2227 10/25/21 0032 10/25/21 0608  HGB 14.3  --   --   --   HCT 41.6  --   --   --   PLT 252  --   --   --   APTT  --   --  30  --   LABPROT  --   --  14.4  --   INR  --   --  1.1  --   HEPARINUNFRC  --   --   --  0.62  CREATININE 1.48*  --   --  0.98  TROPONINIHS  --  164* 150*  --      Estimated Creatinine Clearance: 38.5 mL/min (by C-G formula based on SCr of 0.98 mg/dL).   Medical History: Past Medical History:  Diagnosis Date   Cholelithiasis    Chronic diarrhea    Colon polyp    Diverticulosis    GERD (gastroesophageal reflux disease)    if she eats acidic foods   Hypertension    Mucoid cyst of joint    left index finger   Osteoporosis     Assessment: Pt is 87 yo female presenting to ED at the advice of PCP for electrolyte imbalance found with elevated Troponin I.  Goal of Therapy:  Heparin level 0.3-0.7 units/ml Monitor platelets by anticoagulation protocol: Yes   Plan:  1/12@0608 : HL 0.62, therapeutic x1 Continue heparin infusion at 800 units/hr Check confirmatory HL in 8 hrs  CBC daily while on heparin  Pearla Dubonnet, PharmD Clinical Pharmacist 10/25/2021 8:00 AM

## 2021-10-25 NOTE — Progress Notes (Signed)
OT Cancellation Note  Patient Details Name: Morgan Sandoval MRN: [id number] DOB: 11/20/1934   Cancelled Treatment:    Reason Eval/Treat Not Completed: OT screened, no needs identified, will sign off;Other (comment) (per PT, pt is performing mobility/ADL performance at baseline, ambulating 800 feet, no imbalance, has no DME needs. OT will sign off at this time. Please re-consult if there is a change in functional status.)  Shanon Payor, OTD OTR/L  10/25/21, 11:36 AM

## 2021-10-26 NOTE — Evaluation (Signed)
Physical Therapy Evaluation Patient Details Name: Morgan Sandoval MRN: [id number] DOB: 12-19-1934 Today's Date: 10/26/2021  History of Present Illness  Morgan Sandoval is an 24yoF who comes to [facility] from PCP on 1/11 c mild hypokalemia and hyponatremia, reports mild dizziness previous day and brief palpitations. PMH: RA, HTN, COPD, bronchitis, spinal stenosis, tobacco abuse, osteoporosis, GERD. Pt admitted in August (per pt) for PNA, diagnosed with PF at that time, is now followed by outpatient pulmonology.  Clinical Impression  Pt admitted c above Dx. Pt shows functional limitations due to the deficits listed below (see "PT Problem List"). Patient agreeable to PT evaluation. PLOF and home setup obtained. Pt reports a single day of dizziness with upright earlier in the week, subsequently improved thereafter prior to electrolyte correction. At time of evaluation lytes are corrected to WNL. Pt attests to baseline performance in all mobility this date, including DOE during limited community distance AMB, all performed with independence, no device needed. Pt educated on potential need for supplemental O2 for exercise or for community distance AMB, unclear if new PF diagnosis in August and continued workup with pulmonology. Author reached out to pulmonologist. Pt can DC at this time, no acute impairment, no acute rehab needs, no DME needs, no safety concerns on the part of author or patient. RN and attending made abreast.     Recommendations for follow up therapy are one component of a multi-disciplinary discharge planning process, led by the attending physician.  Recommendations may be updated based on patient status, additional functional criteria and insurance authorization.  Follow Up Recommendations No PT follow up    Assistance Recommended at Discharge None  Patient can return home with the following       Equipment Recommendations None recommended by PT  Recommendations for Other Services        Functional Status Assessment Patient has not had a recent decline in their functional status     Precautions / Restrictions Precautions Precautions: None Restrictions Weight Bearing Restrictions: No      Mobility  Bed Mobility Overal bed mobility: Independent                  Transfers Overall transfer level: Independent Equipment used: None                    Ambulation/Gait Ambulation/Gait assistance: Supervision;Modified independent (Device/Increase time) Gait Distance (Feet): 800 Feet   Gait Pattern/deviations: WFL(Within Functional Limits)       General Gait Details: Pt has some SOB during AMB which she reports is normal for her. Terminal sats at 82-84% with recovery to 93% after 90 sec sitting. Unclear if baseline- author reached out to pulmonologist who is scheduled to see her next month.  Stairs            Wheelchair Mobility    Modified Rankin (Stroke Patients Only)       Balance Overall balance assessment: Independent                                           Pertinent Vitals/Pain Pain Assessment: No/denies pain    Home Living Family/patient expects to be discharged to:: Private residence   Available Help at Discharge: Available 24 hours/day Type of Home: Independent living facility Home Access: Level entry       Home Layout: One level Home Equipment: Conservation officer, nature (2 wheels) Additional  Comments: Has not needed RW for several months    Prior Function Prior Level of Function : Independent/Modified Independent                     Hand Dominance        Extremity/Trunk Assessment                Communication      Cognition                                                General Comments      Exercises     Assessment/Plan    PT Assessment All further PT needs can be met in the next venue of care  PT Problem List Decreased activity tolerance;Cardiopulmonary  status limiting activity       PT Treatment Interventions      PT Goals (Current goals can be found in the Care Plan section)  Acute Rehab PT Goals PT Goal Formulation: All assessment and education complete, DC therapy    Frequency       Co-evaluation               AM-PAC PT "6 Clicks" Mobility  Outcome Measure Help needed turning from your back to your side while in a flat bed without using bedrails?: None Help needed moving from lying on your back to sitting on the side of a flat bed without using bedrails?: None Help needed moving to and from a bed to a chair (including a wheelchair)?: None Help needed standing up from a chair using your arms (e.g., wheelchair or bedside chair)?: None Help needed to walk in hospital room?: None Help needed climbing 3-5 steps with a railing? : None 6 Click Score: 24    End of Session Equipment Utilized During Treatment: Gait belt Activity Tolerance: Patient tolerated treatment well;No increased pain Patient left: in bed;with call bell/phone within reach Nurse Communication: Mobility status      Time: 4944-7395 PT Time Calculation (min) (ACUTE ONLY): 20 min   Charges:   PT Evaluation $PT Eval Moderate Complexity: 1 Mod         8:01 AM, 10/26/21 Etta Grandchild, PT, DPT Physical Therapist - [facility]  [phone] ([place])    Gaylin Bulthuis C 10/26/2021, 7:57 AM

## 2021-11-02 DIAGNOSIS — E876 Hypokalemia: Secondary | ICD-10-CM | POA: Diagnosis not present

## 2021-11-02 DIAGNOSIS — E871 Hypo-osmolality and hyponatremia: Secondary | ICD-10-CM | POA: Diagnosis not present

## 2021-11-02 DIAGNOSIS — I10 Essential (primary) hypertension: Secondary | ICD-10-CM | POA: Diagnosis not present

## 2021-11-05 DIAGNOSIS — J8410 Pulmonary fibrosis, unspecified: Secondary | ICD-10-CM | POA: Diagnosis not present

## 2021-11-05 DIAGNOSIS — R079 Chest pain, unspecified: Secondary | ICD-10-CM | POA: Diagnosis not present

## 2021-11-05 DIAGNOSIS — M5489 Other dorsalgia: Secondary | ICD-10-CM | POA: Diagnosis not present

## 2021-11-14 DIAGNOSIS — M0579 Rheumatoid arthritis with rheumatoid factor of multiple sites without organ or systems involvement: Secondary | ICD-10-CM | POA: Diagnosis not present

## 2021-11-14 DIAGNOSIS — M546 Pain in thoracic spine: Secondary | ICD-10-CM | POA: Diagnosis not present

## 2021-11-14 DIAGNOSIS — G8929 Other chronic pain: Secondary | ICD-10-CM | POA: Diagnosis not present

## 2021-11-15 DIAGNOSIS — M545 Low back pain, unspecified: Secondary | ICD-10-CM | POA: Diagnosis not present

## 2021-11-15 DIAGNOSIS — M6281 Muscle weakness (generalized): Secondary | ICD-10-CM | POA: Diagnosis not present

## 2021-11-21 DIAGNOSIS — M5414 Radiculopathy, thoracic region: Secondary | ICD-10-CM | POA: Diagnosis not present

## 2021-11-21 DIAGNOSIS — S22060A Wedge compression fracture of T7-T8 vertebra, initial encounter for closed fracture: Secondary | ICD-10-CM | POA: Diagnosis not present

## 2021-11-28 DIAGNOSIS — M545 Low back pain, unspecified: Secondary | ICD-10-CM | POA: Diagnosis not present

## 2021-11-28 DIAGNOSIS — M6281 Muscle weakness (generalized): Secondary | ICD-10-CM | POA: Diagnosis not present

## 2021-11-29 DIAGNOSIS — M6281 Muscle weakness (generalized): Secondary | ICD-10-CM | POA: Diagnosis not present

## 2021-11-29 DIAGNOSIS — M545 Low back pain, unspecified: Secondary | ICD-10-CM | POA: Diagnosis not present

## 2021-12-05 DIAGNOSIS — M545 Low back pain, unspecified: Secondary | ICD-10-CM | POA: Diagnosis not present

## 2021-12-05 DIAGNOSIS — M6281 Muscle weakness (generalized): Secondary | ICD-10-CM | POA: Diagnosis not present

## 2021-12-07 DIAGNOSIS — M6281 Muscle weakness (generalized): Secondary | ICD-10-CM | POA: Diagnosis not present

## 2021-12-07 DIAGNOSIS — M545 Low back pain, unspecified: Secondary | ICD-10-CM | POA: Diagnosis not present

## 2021-12-12 DIAGNOSIS — M6281 Muscle weakness (generalized): Secondary | ICD-10-CM | POA: Diagnosis not present

## 2021-12-12 DIAGNOSIS — M545 Low back pain, unspecified: Secondary | ICD-10-CM | POA: Diagnosis not present

## 2021-12-14 DIAGNOSIS — M6281 Muscle weakness (generalized): Secondary | ICD-10-CM | POA: Diagnosis not present

## 2021-12-14 DIAGNOSIS — M545 Low back pain, unspecified: Secondary | ICD-10-CM | POA: Diagnosis not present

## 2021-12-18 DIAGNOSIS — M545 Low back pain, unspecified: Secondary | ICD-10-CM | POA: Diagnosis not present

## 2021-12-18 DIAGNOSIS — M6281 Muscle weakness (generalized): Secondary | ICD-10-CM | POA: Diagnosis not present

## 2021-12-20 DIAGNOSIS — M6281 Muscle weakness (generalized): Secondary | ICD-10-CM | POA: Diagnosis not present

## 2021-12-20 DIAGNOSIS — M545 Low back pain, unspecified: Secondary | ICD-10-CM | POA: Diagnosis not present

## 2021-12-25 DIAGNOSIS — M545 Low back pain, unspecified: Secondary | ICD-10-CM | POA: Diagnosis not present

## 2021-12-25 DIAGNOSIS — M6281 Muscle weakness (generalized): Secondary | ICD-10-CM | POA: Diagnosis not present

## 2021-12-28 DIAGNOSIS — M6281 Muscle weakness (generalized): Secondary | ICD-10-CM | POA: Diagnosis not present

## 2021-12-28 DIAGNOSIS — M545 Low back pain, unspecified: Secondary | ICD-10-CM | POA: Diagnosis not present

## 2021-12-31 DIAGNOSIS — M6281 Muscle weakness (generalized): Secondary | ICD-10-CM | POA: Diagnosis not present

## 2021-12-31 DIAGNOSIS — M545 Low back pain, unspecified: Secondary | ICD-10-CM | POA: Diagnosis not present

## 2022-01-03 DIAGNOSIS — M545 Low back pain, unspecified: Secondary | ICD-10-CM | POA: Diagnosis not present

## 2022-01-03 DIAGNOSIS — M6281 Muscle weakness (generalized): Secondary | ICD-10-CM | POA: Diagnosis not present

## 2022-01-08 DIAGNOSIS — M6281 Muscle weakness (generalized): Secondary | ICD-10-CM | POA: Diagnosis not present

## 2022-01-08 DIAGNOSIS — M545 Low back pain, unspecified: Secondary | ICD-10-CM | POA: Diagnosis not present

## 2022-01-09 ENCOUNTER — Other Ambulatory Visit: Payer: Self-pay | Admitting: Physical Medicine & Rehabilitation

## 2022-01-09 DIAGNOSIS — M5441 Lumbago with sciatica, right side: Secondary | ICD-10-CM | POA: Diagnosis not present

## 2022-01-09 DIAGNOSIS — M5414 Radiculopathy, thoracic region: Secondary | ICD-10-CM | POA: Diagnosis not present

## 2022-01-09 DIAGNOSIS — M5442 Lumbago with sciatica, left side: Secondary | ICD-10-CM | POA: Diagnosis not present

## 2022-01-09 DIAGNOSIS — G8929 Other chronic pain: Secondary | ICD-10-CM | POA: Diagnosis not present

## 2022-01-09 DIAGNOSIS — M546 Pain in thoracic spine: Secondary | ICD-10-CM | POA: Diagnosis not present

## 2022-01-09 DIAGNOSIS — S22000S Wedge compression fracture of unspecified thoracic vertebra, sequela: Secondary | ICD-10-CM | POA: Diagnosis not present

## 2022-01-11 DIAGNOSIS — M6281 Muscle weakness (generalized): Secondary | ICD-10-CM | POA: Diagnosis not present

## 2022-01-11 DIAGNOSIS — M545 Low back pain, unspecified: Secondary | ICD-10-CM | POA: Diagnosis not present

## 2022-01-15 DIAGNOSIS — M6281 Muscle weakness (generalized): Secondary | ICD-10-CM | POA: Diagnosis not present

## 2022-01-15 DIAGNOSIS — H905 Unspecified sensorineural hearing loss: Secondary | ICD-10-CM | POA: Diagnosis not present

## 2022-01-15 DIAGNOSIS — M545 Low back pain, unspecified: Secondary | ICD-10-CM | POA: Diagnosis not present

## 2022-01-17 ENCOUNTER — Ambulatory Visit
Admission: RE | Admit: 2022-01-17 | Discharge: 2022-01-17 | Disposition: A | Discharge status: Home / Self Care | Payer: Medicare Other | Source: Ambulatory Visit | Attending: Physical Medicine & Rehabilitation | Admitting: Physical Medicine & Rehabilitation

## 2022-01-17 DIAGNOSIS — N1831 Chronic kidney disease, stage 3a: Secondary | ICD-10-CM | POA: Diagnosis not present

## 2022-01-17 DIAGNOSIS — I10 Essential (primary) hypertension: Secondary | ICD-10-CM | POA: Diagnosis not present

## 2022-01-17 DIAGNOSIS — M4805 Spinal stenosis, thoracolumbar region: Secondary | ICD-10-CM | POA: Diagnosis not present

## 2022-01-17 DIAGNOSIS — Z87891 Personal history of nicotine dependence: Secondary | ICD-10-CM | POA: Diagnosis not present

## 2022-01-17 DIAGNOSIS — M5127 Other intervertebral disc displacement, lumbosacral region: Secondary | ICD-10-CM | POA: Diagnosis not present

## 2022-01-17 DIAGNOSIS — M5442 Lumbago with sciatica, left side: Secondary | ICD-10-CM | POA: Diagnosis not present

## 2022-01-17 DIAGNOSIS — M5441 Lumbago with sciatica, right side: Secondary | ICD-10-CM | POA: Diagnosis not present

## 2022-01-18 DIAGNOSIS — M6281 Muscle weakness (generalized): Secondary | ICD-10-CM | POA: Diagnosis not present

## 2022-01-18 DIAGNOSIS — M545 Low back pain, unspecified: Secondary | ICD-10-CM | POA: Diagnosis not present

## 2022-01-23 DIAGNOSIS — M5442 Lumbago with sciatica, left side: Secondary | ICD-10-CM | POA: Diagnosis not present

## 2022-01-23 DIAGNOSIS — M546 Pain in thoracic spine: Secondary | ICD-10-CM | POA: Diagnosis not present

## 2022-01-23 DIAGNOSIS — M5414 Radiculopathy, thoracic region: Secondary | ICD-10-CM | POA: Diagnosis not present

## 2022-01-23 DIAGNOSIS — M5441 Lumbago with sciatica, right side: Secondary | ICD-10-CM | POA: Diagnosis not present

## 2022-01-23 DIAGNOSIS — G8929 Other chronic pain: Secondary | ICD-10-CM | POA: Diagnosis not present

## 2022-01-24 DIAGNOSIS — M6281 Muscle weakness (generalized): Secondary | ICD-10-CM | POA: Diagnosis not present

## 2022-01-24 DIAGNOSIS — M545 Low back pain, unspecified: Secondary | ICD-10-CM | POA: Diagnosis not present

## 2022-01-25 DIAGNOSIS — I129 Hypertensive chronic kidney disease with stage 1 through stage 4 chronic kidney disease, or unspecified chronic kidney disease: Secondary | ICD-10-CM | POA: Diagnosis not present

## 2022-01-25 DIAGNOSIS — Z136 Encounter for screening for cardiovascular disorders: Secondary | ICD-10-CM | POA: Diagnosis not present

## 2022-01-25 DIAGNOSIS — N1831 Chronic kidney disease, stage 3a: Secondary | ICD-10-CM | POA: Diagnosis not present

## 2022-01-25 DIAGNOSIS — M810 Age-related osteoporosis without current pathological fracture: Secondary | ICD-10-CM | POA: Diagnosis not present

## 2022-01-29 DIAGNOSIS — M6281 Muscle weakness (generalized): Secondary | ICD-10-CM | POA: Diagnosis not present

## 2022-01-29 DIAGNOSIS — M545 Low back pain, unspecified: Secondary | ICD-10-CM | POA: Diagnosis not present

## 2022-01-31 DIAGNOSIS — M6281 Muscle weakness (generalized): Secondary | ICD-10-CM | POA: Diagnosis not present

## 2022-01-31 DIAGNOSIS — M545 Low back pain, unspecified: Secondary | ICD-10-CM | POA: Diagnosis not present

## 2022-02-05 DIAGNOSIS — M545 Low back pain, unspecified: Secondary | ICD-10-CM | POA: Diagnosis not present

## 2022-02-05 DIAGNOSIS — M6281 Muscle weakness (generalized): Secondary | ICD-10-CM | POA: Diagnosis not present

## 2022-02-07 DIAGNOSIS — M6281 Muscle weakness (generalized): Secondary | ICD-10-CM | POA: Diagnosis not present

## 2022-02-07 DIAGNOSIS — M545 Low back pain, unspecified: Secondary | ICD-10-CM | POA: Diagnosis not present

## 2022-02-12 DIAGNOSIS — M6281 Muscle weakness (generalized): Secondary | ICD-10-CM | POA: Diagnosis not present

## 2022-02-12 DIAGNOSIS — M545 Low back pain, unspecified: Secondary | ICD-10-CM | POA: Diagnosis not present

## 2022-02-13 DIAGNOSIS — Z7960 Long term (current) use of unspecified immunomodulators and immunosuppressants: Secondary | ICD-10-CM | POA: Diagnosis not present

## 2022-02-13 DIAGNOSIS — M0579 Rheumatoid arthritis with rheumatoid factor of multiple sites without organ or systems involvement: Secondary | ICD-10-CM | POA: Diagnosis not present

## 2022-02-13 DIAGNOSIS — M0510 Rheumatoid lung disease with rheumatoid arthritis of unspecified site: Secondary | ICD-10-CM | POA: Diagnosis not present

## 2022-02-15 DIAGNOSIS — M545 Low back pain, unspecified: Secondary | ICD-10-CM | POA: Diagnosis not present

## 2022-02-15 DIAGNOSIS — M6281 Muscle weakness (generalized): Secondary | ICD-10-CM | POA: Diagnosis not present

## 2022-02-19 DIAGNOSIS — M6281 Muscle weakness (generalized): Secondary | ICD-10-CM | POA: Diagnosis not present

## 2022-02-19 DIAGNOSIS — M545 Low back pain, unspecified: Secondary | ICD-10-CM | POA: Diagnosis not present

## 2022-02-21 DIAGNOSIS — M545 Low back pain, unspecified: Secondary | ICD-10-CM | POA: Diagnosis not present

## 2022-02-21 DIAGNOSIS — M6281 Muscle weakness (generalized): Secondary | ICD-10-CM | POA: Diagnosis not present

## 2022-02-25 DIAGNOSIS — Z1231 Encounter for screening mammogram for malignant neoplasm of breast: Secondary | ICD-10-CM | POA: Diagnosis not present

## 2022-03-01 DIAGNOSIS — M6281 Muscle weakness (generalized): Secondary | ICD-10-CM | POA: Diagnosis not present

## 2022-03-01 DIAGNOSIS — M545 Low back pain, unspecified: Secondary | ICD-10-CM | POA: Diagnosis not present

## 2022-03-05 DIAGNOSIS — M545 Low back pain, unspecified: Secondary | ICD-10-CM | POA: Diagnosis not present

## 2022-03-05 DIAGNOSIS — M6281 Muscle weakness (generalized): Secondary | ICD-10-CM | POA: Diagnosis not present

## 2022-03-07 DIAGNOSIS — M6281 Muscle weakness (generalized): Secondary | ICD-10-CM | POA: Diagnosis not present

## 2022-03-07 DIAGNOSIS — M545 Low back pain, unspecified: Secondary | ICD-10-CM | POA: Diagnosis not present

## 2022-03-11 DIAGNOSIS — M6281 Muscle weakness (generalized): Secondary | ICD-10-CM | POA: Diagnosis not present

## 2022-03-11 DIAGNOSIS — M545 Low back pain, unspecified: Secondary | ICD-10-CM | POA: Diagnosis not present

## 2022-03-13 DIAGNOSIS — J449 Chronic obstructive pulmonary disease, unspecified: Secondary | ICD-10-CM | POA: Diagnosis not present

## 2022-03-14 DIAGNOSIS — M6281 Muscle weakness (generalized): Secondary | ICD-10-CM | POA: Diagnosis not present

## 2022-03-14 DIAGNOSIS — M545 Low back pain, unspecified: Secondary | ICD-10-CM | POA: Diagnosis not present

## 2022-03-21 DIAGNOSIS — I10 Essential (primary) hypertension: Secondary | ICD-10-CM | POA: Diagnosis not present

## 2022-03-21 DIAGNOSIS — J449 Chronic obstructive pulmonary disease, unspecified: Secondary | ICD-10-CM | POA: Diagnosis not present

## 2022-04-08 DIAGNOSIS — Z87891 Personal history of nicotine dependence: Secondary | ICD-10-CM | POA: Diagnosis not present

## 2022-04-08 DIAGNOSIS — I7300 Raynaud's syndrome without gangrene: Secondary | ICD-10-CM | POA: Diagnosis not present

## 2022-04-08 DIAGNOSIS — Z9049 Acquired absence of other specified parts of digestive tract: Secondary | ICD-10-CM | POA: Diagnosis not present

## 2022-04-08 DIAGNOSIS — I7789 Other specified disorders of arteries and arterioles: Secondary | ICD-10-CM | POA: Diagnosis not present

## 2022-04-08 DIAGNOSIS — I10 Essential (primary) hypertension: Secondary | ICD-10-CM | POA: Diagnosis not present

## 2022-04-08 DIAGNOSIS — Z7951 Long term (current) use of inhaled steroids: Secondary | ICD-10-CM | POA: Insufficient documentation

## 2022-04-08 DIAGNOSIS — J449 Chronic obstructive pulmonary disease, unspecified: Secondary | ICD-10-CM | POA: Diagnosis not present

## 2022-04-08 DIAGNOSIS — S63210A Subluxation of metacarpophalangeal joint of right index finger, initial encounter: Secondary | ICD-10-CM | POA: Diagnosis not present

## 2022-04-08 DIAGNOSIS — M19041 Primary osteoarthritis, right hand: Secondary | ICD-10-CM | POA: Diagnosis not present

## 2022-04-08 DIAGNOSIS — M7989 Other specified soft tissue disorders: Secondary | ICD-10-CM | POA: Diagnosis not present

## 2022-04-08 DIAGNOSIS — M79644 Pain in right finger(s): Secondary | ICD-10-CM | POA: Diagnosis present

## 2022-04-09 ENCOUNTER — Emergency Department: Payer: Medicare Other

## 2022-04-09 ENCOUNTER — Emergency Department
Admission: EM | Admit: 2022-04-09 | Discharge: 2022-04-09 | Disposition: A | Discharge status: Home / Self Care | Payer: Medicare Other | Attending: Emergency Medicine | Admitting: Emergency Medicine

## 2022-04-09 ENCOUNTER — Other Ambulatory Visit: Payer: Self-pay

## 2022-04-09 ENCOUNTER — Encounter: Payer: Self-pay | Admitting: Emergency Medicine

## 2022-04-09 DIAGNOSIS — M19041 Primary osteoarthritis, right hand: Secondary | ICD-10-CM | POA: Diagnosis not present

## 2022-04-09 DIAGNOSIS — Z9049 Acquired absence of other specified parts of digestive tract: Secondary | ICD-10-CM | POA: Diagnosis not present

## 2022-04-09 DIAGNOSIS — I7789 Other specified disorders of arteries and arterioles: Secondary | ICD-10-CM | POA: Diagnosis not present

## 2022-04-09 DIAGNOSIS — S63210A Subluxation of metacarpophalangeal joint of right index finger, initial encounter: Secondary | ICD-10-CM | POA: Diagnosis not present

## 2022-04-09 DIAGNOSIS — I7300 Raynaud's syndrome without gangrene: Secondary | ICD-10-CM

## 2022-04-09 DIAGNOSIS — M7989 Other specified soft tissue disorders: Secondary | ICD-10-CM | POA: Diagnosis not present

## 2022-04-09 LAB — CBC WITH DIFFERENTIAL/PLATELET
Abs Immature Granulocytes: 0.02 10*3/uL (ref 0.00–0.07)
Basophils Absolute: 0.1 10*3/uL (ref 0.0–0.1)
Basophils Relative: 1 %
Eosinophils Absolute: 0.2 10*3/uL (ref 0.0–0.5)
Eosinophils Relative: 3 %
HCT: 43.8 % (ref 36.0–46.0)
Hemoglobin: 13.9 g/dL (ref 12.0–15.0)
Immature Granulocytes: 0 %
Lymphocytes Relative: 22 %
Lymphs Abs: 1.5 10*3/uL (ref 0.7–4.0)
MCH: 31.4 pg (ref 26.0–34.0)
MCHC: 31.7 g/dL (ref 30.0–36.0)
MCV: 99.1 fL (ref 80.0–100.0)
Monocytes Absolute: 0.6 10*3/uL (ref 0.1–1.0)
Monocytes Relative: 9 %
Neutro Abs: 4.2 10*3/uL (ref 1.7–7.7)
Neutrophils Relative %: 65 %
Platelets: 196 10*3/uL (ref 150–400)
RBC: 4.42 MIL/uL (ref 3.87–5.11)
RDW: 13 % (ref 11.5–15.5)
WBC: 6.5 10*3/uL (ref 4.0–10.5)
nRBC: 0 % (ref 0.0–0.2)

## 2022-04-09 LAB — BASIC METABOLIC PANEL
Anion gap: 6 (ref 5–15)
BUN: 16 mg/dL (ref 8–23)
CO2: 27 mmol/L (ref 22–32)
Calcium: 8.9 mg/dL (ref 8.9–10.3)
Chloride: 103 mmol/L (ref 98–111)
Creatinine, Ser: 1.04 mg/dL — ABNORMAL HIGH (ref 0.44–1.00)
GFR, Estimated: 53 mL/min — ABNORMAL LOW (ref >60)
Glucose, Bld: 83 mg/dL (ref 70–99)
Potassium: 3.7 mmol/L (ref 3.5–5.1)
Sodium: 136 mmol/L (ref 135–145)

## 2022-04-09 MED ORDER — IOHEXOL 350 MG/ML SOLN
100.0000 mL | Freq: Once | INTRAVENOUS | Status: AC | PRN
  Administered 2022-04-09: 100 mL via INTRAVENOUS

## 2022-04-09 MED ORDER — SODIUM CHLORIDE 0.9 % IV BOLUS
1000.0000 mL | Freq: Once | INTRAVENOUS | Status: AC
  Administered 2022-04-09: 1000 mL via INTRAVENOUS

## 2022-04-11 DIAGNOSIS — Z7960 Long term (current) use of unspecified immunomodulators and immunosuppressants: Secondary | ICD-10-CM | POA: Diagnosis not present

## 2022-04-11 DIAGNOSIS — M351 Other overlap syndromes: Secondary | ICD-10-CM | POA: Diagnosis not present

## 2022-04-11 DIAGNOSIS — M0579 Rheumatoid arthritis with rheumatoid factor of multiple sites without organ or systems involvement: Secondary | ICD-10-CM | POA: Diagnosis not present

## 2022-04-11 DIAGNOSIS — I7300 Raynaud's syndrome without gangrene: Secondary | ICD-10-CM | POA: Diagnosis not present

## 2022-04-15 ENCOUNTER — Encounter: Payer: Self-pay | Admitting: Emergency Medicine

## 2022-04-15 ENCOUNTER — Other Ambulatory Visit: Payer: Self-pay

## 2022-04-15 ENCOUNTER — Emergency Department
Admission: EM | Admit: 2022-04-15 | Discharge: 2022-04-15 | Disposition: A | Discharge status: Home / Self Care | Payer: Medicare Other | Attending: Emergency Medicine | Admitting: Emergency Medicine

## 2022-04-15 DIAGNOSIS — I10 Essential (primary) hypertension: Secondary | ICD-10-CM | POA: Diagnosis not present

## 2022-04-15 DIAGNOSIS — Z79899 Other long term (current) drug therapy: Secondary | ICD-10-CM | POA: Insufficient documentation

## 2022-04-15 DIAGNOSIS — J449 Chronic obstructive pulmonary disease, unspecified: Secondary | ICD-10-CM | POA: Diagnosis not present

## 2022-04-15 LAB — CBC
HCT: 45.9 % (ref 36.0–46.0)
Hemoglobin: 14.6 g/dL (ref 12.0–15.0)
MCH: 31.2 pg (ref 26.0–34.0)
MCHC: 31.8 g/dL (ref 30.0–36.0)
MCV: 98.1 fL (ref 80.0–100.0)
Platelets: 236 10*3/uL (ref 150–400)
RBC: 4.68 MIL/uL (ref 3.87–5.11)
RDW: 13.2 % (ref 11.5–15.5)
WBC: 7.4 10*3/uL (ref 4.0–10.5)
nRBC: 0 % (ref 0.0–0.2)

## 2022-04-15 LAB — BASIC METABOLIC PANEL
Anion gap: 9 (ref 5–15)
BUN: 15 mg/dL (ref 8–23)
CO2: 28 mmol/L (ref 22–32)
Calcium: 8.9 mg/dL (ref 8.9–10.3)
Chloride: 100 mmol/L (ref 98–111)
Creatinine, Ser: 1.04 mg/dL — ABNORMAL HIGH (ref 0.44–1.00)
GFR, Estimated: 53 mL/min — ABNORMAL LOW (ref >60)
Glucose, Bld: 102 mg/dL — ABNORMAL HIGH (ref 70–99)
Potassium: 3.5 mmol/L (ref 3.5–5.1)
Sodium: 137 mmol/L (ref 135–145)

## 2022-04-15 MED ORDER — BISOPROLOL FUMARATE 5 MG PO TABS: 5.0000 mg | ORAL_TABLET | Freq: Every day | ORAL | 0 refills | Status: DC

## 2022-04-15 NOTE — ED Provider Notes (Signed)
[facility] Provider Note    Event Date/Time   First MD Initiated Contact with Patient 04/15/22 1110     (approximate)   History   Chief Complaint: Hypertension   HPI  Morgan Sandoval is a 87 y.o. female  with a history of HTN and COPD who comes to the ED complaining of severely elevated blood pressure of about 190/100 at home.  No chest pain or shortness of breath, no belly pain or back pain.  No dizziness or syncope.  She complains of a very mild frontal headache earlier which is resolved.  No specific symptoms at present time.  I reviewed outside records including rheumatology clinic note from a week ago noting that they suspected bisoprolol may be contributing to Raynaud's phenomenon and wanted to change this to nifedipine  Blood pressure had previously been well controlled on a regimen of lisinopril and bisoprolol.  However, about a week ago she had an episode of pallor in the right hand which was attributed to Raynaud's phenomenon after work-up.  She followed up with her rheumatologist who discontinued bisoprolol for Procardia.  She took Procardia for the first time 2 days ago and then noticed that her feet felt flushed and warm and has not taken the Procardia since then.  She has also not taken her bisoprolol in the meantime except for this morning when she resumed it after noticing her blood pressure being very elevated.  Yesterday she only took lisinopril.     Physical Exam   Triage Vital Signs: ED Triage Vitals [04/15/22 0936]  Enc Vitals Group     BP (!) 169/98     Pulse Rate 64     Resp 20     Temp 97.9 F (36.6 C)     Temp Source Oral     SpO2 95 %     Weight 106 lb (48.1 kg)     Height '5\' 3"'$  (1.6 m)     Head Circumference      Peak Flow      Pain Score 3     Pain Loc      Pain Edu?      Excl. in [place]?     Most recent vital signs: Vitals:   04/15/22 0936  BP: (!) 169/98  Pulse: 64  Resp: 20  Temp: 97.9 F (36.6 C)  SpO2: 95%     General: Awake, no distress.  CV:  Good peripheral perfusion.  Normal pulses Resp:  Normal effort.  Abd:  No distention.  Other:  Normal mental status, PERRL, EOMI, no lower extremity edema, no pallor, no tenderness.  Good distal perfusion   ED Results / Procedures / Treatments   Labs (all labs ordered are listed, but only abnormal results are displayed) Labs Reviewed  BASIC METABOLIC PANEL - Abnormal; Notable for the following components:      Result Value   Glucose, Bld 102 (*)    Creatinine, Ser 1.04 (*)    GFR, Estimated 53 (*)    All other components within normal limits  CBC     EKG Interpreted by me Normal sinus rhythm, rate of 62.  Right axis, poor R wave progression.  Normal ST segments and T waves.  No ischemic changes.   RADIOLOGY    PROCEDURES:  Procedures   MEDICATIONS ORDERED IN ED: Medications - No data to display   IMPRESSION / MDM / [place] / ED COURSE  I reviewed the triage vital signs  and the nursing notes.                                Patient's presentation is most consistent with severe exacerbation of chronic illness.  Patient presents with markedly elevated blood pressure.  No acute symptoms, no thunderclap headache, no evidence of endorgan dysfunction.  Serum labs unremarkable.  Blood pressure 170/100 in the ED.  Does not require any emergent antihypertensive treatment.  Doubt intracranial hemorrhage, ACS, dissection, aneurysm, meningitis, encephalitis.  I think her flushing and warm to the sensation are due to vasodilation from the Procardia.  I discussed with the patient the expected side effects from the Procardia versus from the bisoprolol which may contribute to her Raynaud's phenomenon.  I think neither is harmful and solicited the patient's preference.  She prefers to revert back to the bisoprolol and lisinopril regimen that she was taking previously as that did work well and she feels nervous about changing her  medications.  I think this is reasonable, and have sent a new prescription for her bisoprolol to her pharmacy.  She will stop taking the Procardia and contact her rheumatologist to update them.        FINAL CLINICAL IMPRESSION(S) / ED DIAGNOSES   Final diagnoses:  Uncontrolled hypertension     Rx / DC Orders   ED Discharge Orders          Ordered    bisoprolol (ZEBETA) 5 MG tablet  Daily        04/15/22 1128             Note:  This document was prepared using Dragon voice recognition software and may include unintentional dictation errors.   Carrie Mew, MD 04/15/22 1153

## 2022-04-15 NOTE — ED Notes (Addendum)
See triage note  Presents with concerns about her blood pressure   States they changed her meds  She took one on Friday  and had a reaction  States she took her old med this am

## 2022-04-15 NOTE — ED Triage Notes (Signed)
Pt via POV from home. Pt c/o hypertension, states she took her BP last night and this morning. Pt states that last Friday her PCP changed her BP medications. States that she took it last night and it was 009F systolic. Pt also c/o headache. Pt is A&Ox4 and NAD

## 2022-05-23 DIAGNOSIS — I7300 Raynaud's syndrome without gangrene: Secondary | ICD-10-CM | POA: Diagnosis not present

## 2022-05-23 DIAGNOSIS — Z79899 Other long term (current) drug therapy: Secondary | ICD-10-CM | POA: Diagnosis not present

## 2022-05-23 DIAGNOSIS — M0510 Rheumatoid lung disease with rheumatoid arthritis of unspecified site: Secondary | ICD-10-CM | POA: Diagnosis not present

## 2022-05-23 DIAGNOSIS — M48061 Spinal stenosis, lumbar region without neurogenic claudication: Secondary | ICD-10-CM | POA: Diagnosis not present

## 2022-05-23 DIAGNOSIS — R21 Rash and other nonspecific skin eruption: Secondary | ICD-10-CM | POA: Diagnosis not present

## 2022-05-24 ENCOUNTER — Other Ambulatory Visit (INDEPENDENT_AMBULATORY_CARE_PROVIDER_SITE_OTHER): Payer: Medicare Other

## 2022-05-24 ENCOUNTER — Encounter (INDEPENDENT_AMBULATORY_CARE_PROVIDER_SITE_OTHER): Payer: Medicare Other | Admitting: Vascular Surgery

## 2022-05-29 ENCOUNTER — Other Ambulatory Visit (INDEPENDENT_AMBULATORY_CARE_PROVIDER_SITE_OTHER): Payer: Self-pay | Admitting: Vascular Surgery

## 2022-05-29 DIAGNOSIS — M6281 Muscle weakness (generalized): Secondary | ICD-10-CM | POA: Diagnosis not present

## 2022-05-29 DIAGNOSIS — M48061 Spinal stenosis, lumbar region without neurogenic claudication: Secondary | ICD-10-CM | POA: Diagnosis not present

## 2022-05-29 DIAGNOSIS — M0510 Rheumatoid lung disease with rheumatoid arthritis of unspecified site: Secondary | ICD-10-CM | POA: Diagnosis not present

## 2022-05-29 DIAGNOSIS — I7300 Raynaud's syndrome without gangrene: Secondary | ICD-10-CM

## 2022-05-30 ENCOUNTER — Ambulatory Visit (INDEPENDENT_AMBULATORY_CARE_PROVIDER_SITE_OTHER): Payer: Medicare Other

## 2022-05-30 ENCOUNTER — Encounter (INDEPENDENT_AMBULATORY_CARE_PROVIDER_SITE_OTHER): Payer: Self-pay | Admitting: Vascular Surgery

## 2022-05-30 ENCOUNTER — Ambulatory Visit (INDEPENDENT_AMBULATORY_CARE_PROVIDER_SITE_OTHER): Payer: Medicare Other | Admitting: Vascular Surgery

## 2022-05-30 VITALS — BP 171/71 | HR 71 | Resp 17 | Ht 63.0 in | Wt 104.8 lb

## 2022-05-30 DIAGNOSIS — K219 Gastro-esophageal reflux disease without esophagitis: Secondary | ICD-10-CM | POA: Diagnosis not present

## 2022-05-30 DIAGNOSIS — I10 Essential (primary) hypertension: Secondary | ICD-10-CM

## 2022-05-30 DIAGNOSIS — I7300 Raynaud's syndrome without gangrene: Secondary | ICD-10-CM

## 2022-05-30 DIAGNOSIS — J449 Chronic obstructive pulmonary disease, unspecified: Secondary | ICD-10-CM | POA: Diagnosis not present

## 2022-06-02 ENCOUNTER — Encounter (INDEPENDENT_AMBULATORY_CARE_PROVIDER_SITE_OTHER): Payer: Self-pay | Admitting: Vascular Surgery

## 2022-06-02 DIAGNOSIS — I7300 Raynaud's syndrome without gangrene: Secondary | ICD-10-CM | POA: Insufficient documentation

## 2022-06-02 NOTE — Progress Notes (Signed)
MRN : [id number]  Morgan Sandoval is a 87 y.o. (Nov 04, 1934) female who presents with chief complaint of check circulation.  History of Present Illness:   The patient is seen for the evaluation of blue discoloration of a finger associated with Raynaud's changes. The patient notes the fingers sometimes turn pale and then blue and become uncomfortable. Exposure to cold environments makes the symptoms much worse. The finger change occurred abruptly. There is no history of trauma or repetitive injury. The patient does note some dry skin.  The patient has not been taking Norvasc daily, rheumatology tried nifedipine but that did not help.  There is no history of malignancy but she has a history of  autoimmune disease.  No recent shortening of the patient's walking distance or new symptoms consistent with claudication.  No history of rest pain symptoms. No new ulcers or wounds of the lower extremities have occurred.  The patient denies amaurosis fugax or recent TIA symptoms. There are no recent neurological changes noted. There is no history of DVT, PE or superficial thrombophlebitis. No recent episodes of angina or shortness of breath documented.    PPG of the fingers is normal  Current Meds  Medication Sig   albuterol (VENTOLIN HFA) 108 (90 Base) MCG/ACT inhaler Inhale 2 puffs into the lungs every 6 (six) hours as needed for wheezing.   alendronate (FOSAMAX) 70 MG tablet Take 70 mg by mouth every Sunday.   bisoprolol (ZEBETA) 5 MG tablet Take 1 tablet (5 mg total) by mouth daily.   cholecalciferol (VITAMIN D3) 25 MCG (1000 UNIT) tablet Take 1,000 Units by mouth daily.   Cyanocobalamin (B-12) 5000 MCG CAPS Take 5,000 mcg by mouth daily.   hydroxychloroquine (PLAQUENIL) 200 MG tablet Take 200 mg by mouth daily.   ipratropium (ATROVENT) 0.06 % nasal spray Place into both nostrils.   lisinopril (ZESTRIL) 5 MG tablet Take 1 tablet by mouth daily.   Multiple Vitamins-Minerals  (PRESERVISION AREDS 2+MULTI VIT) CAPS Take by mouth.   mycophenolate (CELLCEPT) 500 MG tablet Take 500 mg by mouth 2 (two) times daily. Before meals   NITRO-BID 2 % ointment SMARTSIG:T-DERMAL   pantoprazole (PROTONIX) 40 MG tablet Take 40 mg by mouth daily.    Past Medical History:  Diagnosis Date   Cholelithiasis    Chronic diarrhea    Colon polyp    Diverticulosis    GERD (gastroesophageal reflux disease)    if she eats acidic foods   Hypertension    Mucoid cyst of joint    left index finger   Osteoporosis     Past Surgical History:  Procedure Laterality Date   APPENDECTOMY     BIOPSY  05/19/2019   Procedure: BIOPSY;  Surgeon: Rogene Houston, MD;  Location: [facility];  Service: Endoscopy;;  sigmoid colon   CHOLECYSTECTOMY     COLON SURGERY     Removed large polyps   COLONOSCOPY     COLONOSCOPY N/A 05/18/2015   Procedure: COLONOSCOPY;  Surgeon: Rogene Houston, MD;  Location: [facility];  Service: Endoscopy;  Laterality: N/A;  1030   COLONOSCOPY N/A 05/19/2019   Procedure: COLONOSCOPY;  Surgeon: Rogene Houston, MD;  Location: [facility];  Service: Endoscopy;  Laterality: N/A;  8:30   GANGLION CYST EXCISION Left 03/2019   the fore finger   IR VERTEBROPLASTY LUMBAR BX INC UNI/BIL INC/INJECT/IMAGING  07/27/2020   MASS EXCISION  Left 03/02/2019   Procedure: EXCISION CYST, DEBRIDEMENT PROXIMAL INTERPHALANGEAL JOINT LEFT INDEX FINGER;  Surgeon: Daryll Brod, MD;  Location: [place];  Service: Orthopedics;  Laterality: Left;   RECTAL SURGERY  2000s   [facility]   TONSILLECTOMY      Social History Social History   Tobacco Use   Smoking status: Former   Smokeless tobacco: Never  Scientific laboratory technician Use: Never used  Substance Use Topics   Alcohol use: Yes    Comment: occ   Drug use: No    Family History Family History  Problem Relation Age of Onset   Cancer - Colon Brother     Allergies  Allergen Reactions   Hydrochlorothiazide     Sulfa Antibiotics Rash and Hives     REVIEW OF SYSTEMS (Negative unless checked)  Constitutional: '[]'$ Weight loss  '[]'$ Fever  '[]'$ Chills Cardiac: '[]'$ Chest pain   '[]'$ Chest pressure   '[]'$ Palpitations   '[]'$ Shortness of breath when laying flat   '[]'$ Shortness of breath with exertion. Vascular:  '[x]'$ Pain in legs with walking   '[]'$ Pain in legs at rest  '[]'$ History of DVT   '[]'$ Phlebitis   '[]'$ Swelling in legs   '[]'$ Varicose veins   '[]'$ Non-healing ulcers Pulmonary:   '[]'$ Uses home oxygen   '[]'$ Productive cough   '[]'$ Hemoptysis   '[]'$ Wheeze  '[x]'$ COPD   '[]'$ Asthma Neurologic:  '[]'$ Dizziness   '[]'$ Seizures   '[]'$ History of stroke   '[]'$ History of TIA  '[]'$ Aphasia   '[]'$ Vissual changes   '[]'$ Weakness or numbness in arm   '[]'$ Weakness or numbness in leg Musculoskeletal:   '[]'$ Joint swelling   '[]'$ Joint pain   '[]'$ Low back pain Hematologic:  '[]'$ Easy bruising  '[]'$ Easy bleeding   '[]'$ Hypercoagulable state   '[]'$ Anemic Gastrointestinal:  '[]'$ Diarrhea   '[]'$ Vomiting  '[x]'$ Gastroesophageal reflux/heartburn   '[]'$ Difficulty swallowing. Genitourinary:  '[]'$ Chronic kidney disease   '[]'$ Difficult urination  '[]'$ Frequent urination   '[]'$ Blood in urine Skin:  '[]'$ Rashes   '[]'$ Ulcers  Psychological:  '[]'$ History of anxiety   '[]'$  History of major depression.  Physical Examination  Vitals:   05/30/22 1550  BP: (!) 171/71  Pulse: 71  Resp: 17  Weight: 104 lb 12.8 oz (47.5 kg)  Height: '5\' 3"'$  (1.6 m)   Body mass index is 18.56 kg/m. Gen: WD/WN, NAD Head: /AT, No temporalis wasting.  Ear/Nose/Throat: Hearing grossly intact, nares w/o erythema or drainage Eyes: PER, EOMI, sclera nonicteric.  Neck: Supple, no masses.  No bruit or JVD.  Pulmonary:  Good air movement, no audible wheezing, no use of accessory muscles.  Cardiac: RRR, normal S1, S2, no Murmurs. Vascular:  mild Raynaud's changes, no open wounds Vessel Right Left  Radial Palpable Palpable  Gastrointestinal: soft, non-distended. No guarding/no peritoneal signs.  Musculoskeletal: M/S 5/5 throughout.  No visible deformity.   Neurologic: CN 2-12 intact. Pain and light touch intact in extremities.  Symmetrical.  Speech is fluent. Motor exam as listed above. Psychiatric: Judgment intact, Mood & affect appropriate for pt's clinical situation. Dermatologic: No rashes or ulcers noted.  No changes consistent with cellulitis.   CBC Lab Results  Component Value Date   WBC 7.4 04/15/2022   HGB 14.6 04/15/2022   HCT 45.9 04/15/2022   MCV 98.1 04/15/2022   PLT 236 04/15/2022    BMET    Component Value Date/Time   NA 137 04/15/2022 0938   K 3.5 04/15/2022 0938   CL 100 04/15/2022 0938   CO2 28 04/15/2022 0938   GLUCOSE 102 (H) 04/15/2022 0938   BUN 15 04/15/2022  3149   CREATININE 1.04 (H) 04/15/2022 0938   CALCIUM 8.9 04/15/2022 0938   GFRNONAA 53 (L) 04/15/2022 0938   GFRAA 57 (L) 03/01/2019 1246   CrCl cannot be calculated (Patient's most recent lab result is older than the maximum 21 days allowed.).  COAG Lab Results  Component Value Date   INR 1.1 10/25/2021   INR 1.3 (H) 05/28/2021   INR 1.1 07/27/2020    Radiology VAS [place] DOP BILAT COMP TOS DIGITS REYNAUD  Result Date: 05/30/2022 UPPER EXTREMITY DOPPLER STUDY Patient Name:  KAHO SELLE  Date of Exam:   05/30/2022 Medical Rec #: [id number]          Accession #:    [id number] Date of Birth: 1934-11-10           Patient Gender: F Patient Age:   48 years Exam Location:  [facility] Procedure:      VAS UE DOPPLER BILAT/COMP TOS, DIGITS (TO&UE REYNAUDS) Referring Phys: Lelania Bia --------------------------------------------------------------------------------  Indications: Reynauds.  Performing Technologist: Almira Coaster RVS  Examination Guidelines: A complete evaluation includes B-mode imaging, spectral Doppler, color Doppler, and power Doppler as needed of all accessible portions of each vessel. Bilateral testing is considered an integral part of a complete examination. Limited examinations for reoccurring indications may be  performed as noted.  Technologist Notes: Right: Digital PPG tracings obtained appear appropriately pulsatile. Left: Digital PPG tracings obtained appear appropriately pulsatile.  Electronically signed by Hortencia Pilar MD on 05/30/2022 at 4:41:03 PM.    Final      Assessment/Plan 1. Raynaud's disease without gangrene Recommend:  The patient is currently tolerating the Raynaud's changes fairly well. Lengthy discussion regarding keeping the feet and the hands warm; gloves, washing with only warm water (especially avoiding cold water immersion) and using wool socks, specifically Smartwool was recommended.  Possibility of using Norvasc was discussed but it was decided to hold off for now until the benefits of conservative therapy is assessed.  The use of Pletal was also reviewed as well as the fact that it is an off label use which is typically reserved for failures of Norvasc and conservative therapy.    The patient will follow up PRN if the changes worsen or persist.   2. Essential hypertension Continue antihypertensive medications as already ordered, these medications have been reviewed and there are no changes at this time.   3. COPD with chronic bronchitis ([place]) Continue pulmonary medications and aerosols as already ordered, these medications have been reviewed and there are no changes at this time.    4. Gastroesophageal reflux disease without esophagitis Continue PPI as already ordered, this medication has been reviewed and there are no changes at this time.  Avoidence of caffeine and alcohol  Moderate elevation of the head of the bed      Hortencia Pilar, MD  06/02/2022 1:17 PM

## 2022-06-04 DIAGNOSIS — M6281 Muscle weakness (generalized): Secondary | ICD-10-CM | POA: Diagnosis not present

## 2022-06-04 DIAGNOSIS — M48061 Spinal stenosis, lumbar region without neurogenic claudication: Secondary | ICD-10-CM | POA: Diagnosis not present

## 2022-06-04 DIAGNOSIS — M0510 Rheumatoid lung disease with rheumatoid arthritis of unspecified site: Secondary | ICD-10-CM | POA: Diagnosis not present

## 2022-06-06 DIAGNOSIS — M0510 Rheumatoid lung disease with rheumatoid arthritis of unspecified site: Secondary | ICD-10-CM | POA: Diagnosis not present

## 2022-06-06 DIAGNOSIS — M6281 Muscle weakness (generalized): Secondary | ICD-10-CM | POA: Diagnosis not present

## 2022-06-06 DIAGNOSIS — M48061 Spinal stenosis, lumbar region without neurogenic claudication: Secondary | ICD-10-CM | POA: Diagnosis not present

## 2022-06-08 ENCOUNTER — Other Ambulatory Visit: Payer: Self-pay

## 2022-06-08 ENCOUNTER — Emergency Department: Payer: Medicare Other

## 2022-06-08 ENCOUNTER — Encounter: Payer: Self-pay | Admitting: Emergency Medicine

## 2022-06-08 DIAGNOSIS — I129 Hypertensive chronic kidney disease with stage 1 through stage 4 chronic kidney disease, or unspecified chronic kidney disease: Secondary | ICD-10-CM | POA: Diagnosis present

## 2022-06-08 DIAGNOSIS — Y9301 Activity, walking, marching and hiking: Secondary | ICD-10-CM | POA: Diagnosis present

## 2022-06-08 DIAGNOSIS — J9611 Chronic respiratory failure with hypoxia: Secondary | ICD-10-CM | POA: Diagnosis not present

## 2022-06-08 DIAGNOSIS — Z743 Need for continuous supervision: Secondary | ICD-10-CM | POA: Diagnosis not present

## 2022-06-08 DIAGNOSIS — Z888 Allergy status to other drugs, medicaments and biological substances status: Secondary | ICD-10-CM | POA: Diagnosis not present

## 2022-06-08 DIAGNOSIS — K219 Gastro-esophageal reflux disease without esophagitis: Secondary | ICD-10-CM | POA: Diagnosis not present

## 2022-06-08 DIAGNOSIS — S42292A Other displaced fracture of upper end of left humerus, initial encounter for closed fracture: Secondary | ICD-10-CM | POA: Diagnosis present

## 2022-06-08 DIAGNOSIS — I445 Left posterior fascicular block: Secondary | ICD-10-CM | POA: Diagnosis present

## 2022-06-08 DIAGNOSIS — S51012A Laceration without foreign body of left elbow, initial encounter: Secondary | ICD-10-CM | POA: Diagnosis not present

## 2022-06-08 DIAGNOSIS — Z87891 Personal history of nicotine dependence: Secondary | ICD-10-CM

## 2022-06-08 DIAGNOSIS — I6381 Other cerebral infarction due to occlusion or stenosis of small artery: Secondary | ICD-10-CM | POA: Diagnosis not present

## 2022-06-08 DIAGNOSIS — I7300 Raynaud's syndrome without gangrene: Secondary | ICD-10-CM | POA: Diagnosis not present

## 2022-06-08 DIAGNOSIS — Z7983 Long term (current) use of bisphosphonates: Secondary | ICD-10-CM | POA: Diagnosis not present

## 2022-06-08 DIAGNOSIS — N183 Chronic kidney disease, stage 3 unspecified: Secondary | ICD-10-CM | POA: Diagnosis present

## 2022-06-08 DIAGNOSIS — R55 Syncope and collapse: Secondary | ICD-10-CM | POA: Diagnosis not present

## 2022-06-08 DIAGNOSIS — Z79899 Other long term (current) drug therapy: Secondary | ICD-10-CM

## 2022-06-08 DIAGNOSIS — W19XXXA Unspecified fall, initial encounter: Secondary | ICD-10-CM | POA: Diagnosis not present

## 2022-06-08 DIAGNOSIS — Z882 Allergy status to sulfonamides status: Secondary | ICD-10-CM

## 2022-06-08 DIAGNOSIS — I248 Other forms of acute ischemic heart disease: Secondary | ICD-10-CM | POA: Diagnosis not present

## 2022-06-08 DIAGNOSIS — J439 Emphysema, unspecified: Secondary | ICD-10-CM | POA: Diagnosis present

## 2022-06-08 DIAGNOSIS — W1830XA Fall on same level, unspecified, initial encounter: Secondary | ICD-10-CM | POA: Diagnosis present

## 2022-06-08 DIAGNOSIS — R110 Nausea: Secondary | ICD-10-CM | POA: Diagnosis not present

## 2022-06-08 DIAGNOSIS — J8410 Pulmonary fibrosis, unspecified: Secondary | ICD-10-CM | POA: Diagnosis present

## 2022-06-08 DIAGNOSIS — M79603 Pain in arm, unspecified: Secondary | ICD-10-CM | POA: Diagnosis not present

## 2022-06-08 DIAGNOSIS — M810 Age-related osteoporosis without current pathological fracture: Secondary | ICD-10-CM | POA: Diagnosis not present

## 2022-06-08 DIAGNOSIS — R1111 Vomiting without nausea: Secondary | ICD-10-CM | POA: Diagnosis not present

## 2022-06-08 DIAGNOSIS — S42295A Other nondisplaced fracture of upper end of left humerus, initial encounter for closed fracture: Secondary | ICD-10-CM | POA: Diagnosis not present

## 2022-06-08 LAB — CBC
HCT: 42.9 % (ref 36.0–46.0)
Hemoglobin: 13.9 g/dL (ref 12.0–15.0)
MCH: 31.8 pg (ref 26.0–34.0)
MCHC: 32.4 g/dL (ref 30.0–36.0)
MCV: 98.2 fL (ref 80.0–100.0)
Platelets: 217 10*3/uL (ref 150–400)
RBC: 4.37 MIL/uL (ref 3.87–5.11)
RDW: 13.2 % (ref 11.5–15.5)
WBC: 8.4 10*3/uL (ref 4.0–10.5)
nRBC: 0 % (ref 0.0–0.2)

## 2022-06-08 LAB — BASIC METABOLIC PANEL
Anion gap: 8 (ref 5–15)
BUN: 21 mg/dL (ref 8–23)
CO2: 27 mmol/L (ref 22–32)
Calcium: 8.8 mg/dL — ABNORMAL LOW (ref 8.9–10.3)
Chloride: 103 mmol/L (ref 98–111)
Creatinine, Ser: 1.15 mg/dL — ABNORMAL HIGH (ref 0.44–1.00)
GFR, Estimated: 47 mL/min — ABNORMAL LOW (ref >60)
Glucose, Bld: 119 mg/dL — ABNORMAL HIGH (ref 70–99)
Potassium: 4 mmol/L (ref 3.5–5.1)
Sodium: 138 mmol/L (ref 135–145)

## 2022-06-08 NOTE — ED Triage Notes (Signed)
FIRST NURSE NOTE:    Pt arrived via [facility] from apartment at [facility], pt had a fall, pt has hx of falls, pt is not sure why she fell, pt denies hitting her head, pt c/o left upper arm pain, pt also c/o nausea and vomited x 1 was given '4mg'$  IM zofran with EMS  142/80  P-80

## 2022-06-08 NOTE — ED Triage Notes (Signed)
Pt reports she was walking down the hall and passed out. Pt reports left arm pain. Pt denies hitting her head. Pt talks in complete sentences no distress noted

## 2022-06-09 ENCOUNTER — Inpatient Hospital Stay
Admission: EM | Admit: 2022-06-09 | Discharge: 2022-06-12 | DRG: 312 | Disposition: A | Discharge status: Skilled Nursing Facility | Payer: Medicare Other | Source: Skilled Nursing Facility | Attending: Hospitalist | Admitting: Hospitalist

## 2022-06-09 ENCOUNTER — Emergency Department: Payer: Medicare Other

## 2022-06-09 DIAGNOSIS — S42215A Unspecified nondisplaced fracture of surgical neck of left humerus, initial encounter for closed fracture: Secondary | ICD-10-CM | POA: Diagnosis not present

## 2022-06-09 DIAGNOSIS — W1830XA Fall on same level, unspecified, initial encounter: Secondary | ICD-10-CM | POA: Diagnosis present

## 2022-06-09 DIAGNOSIS — I129 Hypertensive chronic kidney disease with stage 1 through stage 4 chronic kidney disease, or unspecified chronic kidney disease: Secondary | ICD-10-CM | POA: Diagnosis not present

## 2022-06-09 DIAGNOSIS — R7989 Other specified abnormal findings of blood chemistry: Secondary | ICD-10-CM | POA: Diagnosis not present

## 2022-06-09 DIAGNOSIS — K219 Gastro-esophageal reflux disease without esophagitis: Secondary | ICD-10-CM | POA: Diagnosis not present

## 2022-06-09 DIAGNOSIS — S42292A Other displaced fracture of upper end of left humerus, initial encounter for closed fracture: Secondary | ICD-10-CM | POA: Diagnosis present

## 2022-06-09 DIAGNOSIS — Z9181 History of falling: Secondary | ICD-10-CM | POA: Diagnosis not present

## 2022-06-09 DIAGNOSIS — I248 Other forms of acute ischemic heart disease: Secondary | ICD-10-CM | POA: Diagnosis present

## 2022-06-09 DIAGNOSIS — R55 Syncope and collapse: Secondary | ICD-10-CM | POA: Insufficient documentation

## 2022-06-09 DIAGNOSIS — S51019A Laceration without foreign body of unspecified elbow, initial encounter: Secondary | ICD-10-CM | POA: Diagnosis not present

## 2022-06-09 DIAGNOSIS — J449 Chronic obstructive pulmonary disease, unspecified: Secondary | ICD-10-CM | POA: Diagnosis present

## 2022-06-09 DIAGNOSIS — N183 Chronic kidney disease, stage 3 unspecified: Secondary | ICD-10-CM | POA: Diagnosis present

## 2022-06-09 DIAGNOSIS — I7300 Raynaud's syndrome without gangrene: Secondary | ICD-10-CM | POA: Diagnosis not present

## 2022-06-09 DIAGNOSIS — J8410 Pulmonary fibrosis, unspecified: Secondary | ICD-10-CM | POA: Diagnosis not present

## 2022-06-09 DIAGNOSIS — I6381 Other cerebral infarction due to occlusion or stenosis of small artery: Secondary | ICD-10-CM | POA: Diagnosis not present

## 2022-06-09 DIAGNOSIS — S42202A Unspecified fracture of upper end of left humerus, initial encounter for closed fracture: Secondary | ICD-10-CM | POA: Insufficient documentation

## 2022-06-09 DIAGNOSIS — M810 Age-related osteoporosis without current pathological fracture: Secondary | ICD-10-CM | POA: Diagnosis not present

## 2022-06-09 DIAGNOSIS — J4489 Other specified chronic obstructive pulmonary disease: Secondary | ICD-10-CM | POA: Diagnosis present

## 2022-06-09 DIAGNOSIS — I445 Left posterior fascicular block: Secondary | ICD-10-CM | POA: Diagnosis present

## 2022-06-09 DIAGNOSIS — S51012A Laceration without foreign body of left elbow, initial encounter: Secondary | ICD-10-CM | POA: Diagnosis not present

## 2022-06-09 DIAGNOSIS — M48061 Spinal stenosis, lumbar region without neurogenic claudication: Secondary | ICD-10-CM | POA: Diagnosis not present

## 2022-06-09 DIAGNOSIS — W19XXXA Unspecified fall, initial encounter: Secondary | ICD-10-CM | POA: Diagnosis not present

## 2022-06-09 DIAGNOSIS — I10 Essential (primary) hypertension: Secondary | ICD-10-CM | POA: Diagnosis present

## 2022-06-09 DIAGNOSIS — Y9301 Activity, walking, marching and hiking: Secondary | ICD-10-CM | POA: Diagnosis present

## 2022-06-09 DIAGNOSIS — Z7983 Long term (current) use of bisphosphonates: Secondary | ICD-10-CM | POA: Diagnosis not present

## 2022-06-09 DIAGNOSIS — S42295A Other nondisplaced fracture of upper end of left humerus, initial encounter for closed fracture: Secondary | ICD-10-CM | POA: Diagnosis not present

## 2022-06-09 DIAGNOSIS — M6281 Muscle weakness (generalized): Secondary | ICD-10-CM | POA: Diagnosis not present

## 2022-06-09 DIAGNOSIS — Z79899 Other long term (current) drug therapy: Secondary | ICD-10-CM | POA: Diagnosis not present

## 2022-06-09 DIAGNOSIS — I214 Non-ST elevation (NSTEMI) myocardial infarction: Secondary | ICD-10-CM | POA: Diagnosis present

## 2022-06-09 DIAGNOSIS — J9611 Chronic respiratory failure with hypoxia: Secondary | ICD-10-CM | POA: Diagnosis present

## 2022-06-09 DIAGNOSIS — Z882 Allergy status to sulfonamides status: Secondary | ICD-10-CM | POA: Diagnosis not present

## 2022-06-09 DIAGNOSIS — M069 Rheumatoid arthritis, unspecified: Secondary | ICD-10-CM | POA: Diagnosis not present

## 2022-06-09 DIAGNOSIS — Z87891 Personal history of nicotine dependence: Secondary | ICD-10-CM | POA: Diagnosis not present

## 2022-06-09 DIAGNOSIS — S42215D Unspecified nondisplaced fracture of surgical neck of left humerus, subsequent encounter for fracture with routine healing: Secondary | ICD-10-CM | POA: Diagnosis not present

## 2022-06-09 DIAGNOSIS — J42 Unspecified chronic bronchitis: Secondary | ICD-10-CM | POA: Diagnosis not present

## 2022-06-09 DIAGNOSIS — J439 Emphysema, unspecified: Secondary | ICD-10-CM | POA: Diagnosis present

## 2022-06-09 DIAGNOSIS — R262 Difficulty in walking, not elsewhere classified: Secondary | ICD-10-CM | POA: Diagnosis not present

## 2022-06-09 DIAGNOSIS — Z888 Allergy status to other drugs, medicaments and biological substances status: Secondary | ICD-10-CM | POA: Diagnosis not present

## 2022-06-09 LAB — PROTIME-INR
INR: 1.2 (ref 0.8–1.2)
Prothrombin Time: 14.7 seconds (ref 11.4–15.2)

## 2022-06-09 LAB — APTT: aPTT: 34 seconds (ref 24–36)

## 2022-06-09 LAB — HEPARIN LEVEL (UNFRACTIONATED): Heparin Unfractionated: 0.4 IU/mL (ref 0.30–0.70)

## 2022-06-09 LAB — TROPONIN I (HIGH SENSITIVITY)
Troponin I (High Sensitivity): 299 ng/L (ref <18)
Troponin I (High Sensitivity): 205 ng/L (ref <18)

## 2022-06-09 MED ORDER — ATORVASTATIN CALCIUM 20 MG PO TABS
80.0000 mg | ORAL_TABLET | Freq: Every day | ORAL | Status: DC
  Administered 2022-06-09 – 2022-06-12 (×4): 80 mg via ORAL
  Filled 2022-06-09 (×5): qty 4

## 2022-06-09 MED ORDER — HEPARIN BOLUS VIA INFUSION
2900.0000 [IU] | Freq: Once | INTRAVENOUS | Status: AC
  Administered 2022-06-09: 2900 [IU] via INTRAVENOUS
  Filled 2022-06-09: qty 2900

## 2022-06-09 MED ORDER — HYDROXYCHLOROQUINE SULFATE 200 MG PO TABS
200.0000 mg | ORAL_TABLET | Freq: Every day | ORAL | Status: DC
  Administered 2022-06-09 – 2022-06-12 (×4): 200 mg via ORAL
  Filled 2022-06-09 (×4): qty 1

## 2022-06-09 MED ORDER — HYDROCODONE-ACETAMINOPHEN 5-325 MG PO TABS
1.0000 | ORAL_TABLET | Freq: Once | ORAL | Status: AC
  Administered 2022-06-09: 1 via ORAL
  Filled 2022-06-09: qty 1

## 2022-06-09 MED ORDER — MAGNESIUM HYDROXIDE 400 MG/5ML PO SUSP
30.0000 mL | Freq: Every day | ORAL | Status: DC | PRN
  Administered 2022-06-11: 30 mL via ORAL
  Filled 2022-06-09: qty 30

## 2022-06-09 MED ORDER — MYCOPHENOLATE MOFETIL 250 MG PO CAPS
500.0000 mg | ORAL_CAPSULE | Freq: Two times a day (BID) | ORAL | Status: DC
  Administered 2022-06-09 – 2022-06-12 (×5): 500 mg via ORAL
  Filled 2022-06-09 (×8): qty 2

## 2022-06-09 MED ORDER — PANTOPRAZOLE SODIUM 40 MG PO TBEC
40.0000 mg | DELAYED_RELEASE_TABLET | Freq: Every day | ORAL | Status: DC
  Administered 2022-06-09 – 2022-06-12 (×4): 40 mg via ORAL
  Filled 2022-06-09 (×4): qty 1

## 2022-06-09 MED ORDER — HEPARIN SODIUM (PORCINE) 5000 UNIT/ML IJ SOLN: 60.0000 [IU]/kg | Freq: Once | INTRAMUSCULAR | Status: DC

## 2022-06-09 MED ORDER — ALPRAZOLAM 0.25 MG PO TABS
0.2500 mg | ORAL_TABLET | Freq: Two times a day (BID) | ORAL | Status: DC | PRN
  Administered 2022-06-10: 0.25 mg via ORAL
  Filled 2022-06-09: qty 1

## 2022-06-09 MED ORDER — VITAMIN B-12 1000 MCG PO TABS
2000.0000 ug | ORAL_TABLET | Freq: Every day | ORAL | Status: DC
  Administered 2022-06-09 – 2022-06-12 (×4): 2000 ug via ORAL
  Filled 2022-06-09 (×4): qty 2

## 2022-06-09 MED ORDER — ASPIRIN 81 MG PO CHEW: 324.0000 mg | CHEWABLE_TABLET | ORAL | Status: DC

## 2022-06-09 MED ORDER — ASPIRIN 300 MG RE SUPP: 300.0000 mg | RECTAL | Status: DC

## 2022-06-09 MED ORDER — ONDANSETRON HCL 4 MG/2ML IJ SOLN
4.0000 mg | Freq: Four times a day (QID) | INTRAMUSCULAR | Status: DC | PRN
  Administered 2022-06-09: 4 mg via INTRAVENOUS
  Filled 2022-06-09: qty 2

## 2022-06-09 MED ORDER — SODIUM CHLORIDE 0.9 % IV SOLN: INTRAVENOUS | Status: DC

## 2022-06-09 MED ORDER — LISINOPRIL 10 MG PO TABS
10.0000 mg | ORAL_TABLET | Freq: Every day | ORAL | Status: DC
  Administered 2022-06-09 – 2022-06-10 (×2): 10 mg via ORAL
  Filled 2022-06-09 (×2): qty 1

## 2022-06-09 MED ORDER — ALBUTEROL SULFATE HFA 108 (90 BASE) MCG/ACT IN AERS: 2.0000 | INHALATION_SPRAY | Freq: Four times a day (QID) | RESPIRATORY_TRACT | Status: DC | PRN

## 2022-06-09 MED ORDER — SODIUM CHLORIDE 0.9 % IV BOLUS
500.0000 mL | Freq: Once | INTRAVENOUS | Status: AC
  Administered 2022-06-09: 500 mL via INTRAVENOUS

## 2022-06-09 MED ORDER — HEPARIN (PORCINE) 25000 UT/250ML-% IV SOLN
14.0000 [IU]/kg/h | INTRAVENOUS | Status: DC
Start: 2022-06-09 — End: 2022-06-09

## 2022-06-09 MED ORDER — HEPARIN (PORCINE) 25000 UT/250ML-% IV SOLN
600.0000 [IU]/h | INTRAVENOUS | Status: DC
  Administered 2022-06-09: 600 [IU]/h via INTRAVENOUS
  Filled 2022-06-09: qty 250

## 2022-06-09 MED ORDER — TRAZODONE HCL 50 MG PO TABS
25.0000 mg | ORAL_TABLET | Freq: Every evening | ORAL | Status: DC | PRN
  Administered 2022-06-11: 25 mg via ORAL
  Filled 2022-06-09: qty 1

## 2022-06-09 MED ORDER — NITROGLYCERIN 0.4 MG SL SUBL: 0.4000 mg | SUBLINGUAL_TABLET | SUBLINGUAL | Status: DC | PRN

## 2022-06-09 MED ORDER — HYDROCODONE-ACETAMINOPHEN 5-325 MG PO TABS
1.0000 | ORAL_TABLET | Freq: Four times a day (QID) | ORAL | Status: DC | PRN
  Administered 2022-06-09 – 2022-06-11 (×3): 2 via ORAL
  Filled 2022-06-09 (×3): qty 2

## 2022-06-09 MED ORDER — VITAMIN D 25 MCG (1000 UNIT) PO TABS
1000.0000 [IU] | ORAL_TABLET | Freq: Every day | ORAL | Status: DC
  Administered 2022-06-09 – 2022-06-12 (×4): 1000 [IU] via ORAL
  Filled 2022-06-09 (×4): qty 1

## 2022-06-09 MED ORDER — ASPIRIN 81 MG PO CHEW
324.0000 mg | CHEWABLE_TABLET | Freq: Once | ORAL | Status: AC
Start: 2022-06-09 — End: 2022-06-09
  Administered 2022-06-09: 324 mg via ORAL
  Filled 2022-06-09: qty 4

## 2022-06-09 MED ORDER — NITROGLYCERIN 2 % TD OINT
1.0000 [in_us] | TOPICAL_OINTMENT | Freq: Four times a day (QID) | TRANSDERMAL | Status: DC
Start: 2022-06-09 — End: 2022-06-09

## 2022-06-09 MED ORDER — ALBUTEROL SULFATE (2.5 MG/3ML) 0.083% IN NEBU: 2.5000 mg | INHALATION_SOLUTION | Freq: Four times a day (QID) | RESPIRATORY_TRACT | Status: DC | PRN

## 2022-06-09 MED ORDER — ASPIRIN 81 MG PO TBEC
81.0000 mg | DELAYED_RELEASE_TABLET | Freq: Every day | ORAL | Status: DC
  Administered 2022-06-10 – 2022-06-12 (×3): 81 mg via ORAL
  Filled 2022-06-09 (×3): qty 1

## 2022-06-09 MED ORDER — MORPHINE SULFATE (PF) 2 MG/ML IV SOLN: 1.0000 mg | INTRAVENOUS | Status: DC | PRN

## 2022-06-09 MED ORDER — ACETAMINOPHEN 325 MG PO TABS: 650.0000 mg | ORAL_TABLET | ORAL | Status: DC | PRN

## 2022-06-09 MED ORDER — BISOPROLOL FUMARATE 5 MG PO TABS
5.0000 mg | ORAL_TABLET | Freq: Every day | ORAL | Status: DC
  Administered 2022-06-09: 5 mg via ORAL
  Filled 2022-06-09 (×2): qty 1

## 2022-06-09 NOTE — Assessment & Plan Note (Signed)
-   This is clearly secondary to #1. - Management as above.

## 2022-06-09 NOTE — Consult Note (Signed)
CARDIOLOGY CONSULT NOTE               Morgan Sandoval ID: Morgan Sandoval MRN: [id number] DOB/AGE: 1935/09/02 87 y.o.  Admit date: 06/09/2022 Referring Physician Dr. Garner Gavel hospitalist Primary Physician Dr. 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  No episodes recently no chest pain no palpitations no tachycardia no known coronary disease no known heart failure.  Morgan Sandoval seems to be in reasonable health has been having trouble with her blood pressure now found to be bradycardic with an episode of syncope needing further cardiac evaluation  Review of systems complete and found to be negative unless listed above     Past Medical History:  Diagnosis Date   Cholelithiasis    Chronic diarrhea    Colon polyp    Diverticulosis    GERD (gastroesophageal reflux disease)    if she eats acidic foods   Hypertension    Mucoid cyst of joint    left index finger   Osteoporosis     Past Surgical History:  Procedure Laterality Date   APPENDECTOMY     BIOPSY  05/19/2019   Procedure: BIOPSY;  Surgeon: Rogene Houston, MD;  [facility];  Service: Endoscopy;;  sigmoid colon   CHOLECYSTECTOMY     COLON SURGERY     Removed large polyps   COLONOSCOPY     COLONOSCOPY N/A 05/18/2015   Procedure: COLONOSCOPY;  Surgeon: Rogene Houston, MD;  [facility];  Service: Endoscopy;  Laterality: N/A;  1030   COLONOSCOPY N/A 05/19/2019   Procedure: COLONOSCOPY;  Surgeon: Rogene Houston, MD;  [facility];  Service: Endoscopy;  Laterality: N/A;  8:30   GANGLION CYST EXCISION Left 03/2019   the fore finger   IR VERTEBROPLASTY LUMBAR BX INC UNI/BIL INC/INJECT/IMAGING  07/27/2020   MASS EXCISION Left 03/02/2019   Procedure: EXCISION CYST, DEBRIDEMENT PROXIMAL INTERPHALANGEAL JOINT LEFT INDEX FINGER;  Surgeon: Daryll Brod, MD;  Location: [place];  Service: Orthopedics;  Laterality: Left;   RECTAL SURGERY  2000s   [facility]   TONSILLECTOMY      (Not in a hospital admission)  Social History   Socioeconomic History   Marital status: Widowed    Spouse name: Not on file   Number of children: Not on file   Years of education: Not on file   Highest education level: Not on file  Occupational History   Not on file  Tobacco Use   Smoking status: Former   Smokeless tobacco: Never  Vaping Use   Vaping Use: Never used  Substance and Sexual Activity   Alcohol use: Yes    Comment: occ   Drug use: No   Sexual activity: Not on file  Other Topics Concern   Not on file  Social History Narrative   Not on file   Social Determinants of Health   Financial Resource Strain: Not on file  Food Insecurity: Not on file  Transportation Needs: Not on file  Physical Activity: Not on file  Stress: Not on file  Social Connections: Not on file  Intimate Partner Violence: Not on file    Family History  Problem Relation Age of Onset   Cancer - Colon Brother       Review of systems complete and found to be negative unless listed above      PHYSICAL EXAM  General: Well developed, well nourished, in no acute distress HEENT:  Normocephalic and atramatic Neck:  No JVD.  Lungs: Clear bilaterally to auscultation and percussion. Heart: HRRR . Normal S1 and S2 without 2/6 sem gallops or murmurs.  Abdomen: Bowel sounds are positive, abdomen soft and non-tender  Msk:  Back normal, normal gait. Normal strength and tone for age. Extremities: No clubbing, cyanosis or edema.    Neuro: Alert and oriented X 3. Psych:  Good affect, responds appropriately  Labs:   Lab Results  Component Value Date   WBC 8.4 06/08/2022   HGB 13.9 06/08/2022   HCT 42.9 06/08/2022   MCV 98.2 06/08/2022   PLT 217 06/08/2022    Recent Labs  Lab 06/08/22 2037  NA 138  K 4.0  CL 103  CO2 27  BUN 21  CREATININE 1.15*  CALCIUM 8.8*  GLUCOSE 119*   No results found for: "CKTOTAL", "CKMB", "CKMBINDEX", "TROPONINI" No results found for: "CHOL" No results found for: "HDL" No results found for: "LDLCALC" No results found for: "TRIG" No results found for: "CHOLHDL" No results found for: "LDLDIRECT"    Radiology: [facility] Chest Port 1 View  Result Date: 06/09/2022 CLINICAL DATA:  Syncope EXAM: PORTABLE CHEST 1 VIEW COMPARISON:  10/24/2021.  Left humerus 06/08/2022 FINDINGS: Extensive chronic lung disease/fibrosis throughout the lungs bilaterally, left greater than right. No acute confluent airspace opacities or effusions. Heart is normal size. Left humeral neck fracture noted as seen on humerus series yesterday. IMPRESSION: Extensive chronic lung disease/fibrosis. No active disease. Electronically Signed   By: Rolm Baptise M.D.   On: 06/09/2022 01:58   CT Head Wo Contrast  Result Date: 06/09/2022 CLINICAL DATA:  Mental status changes, unknown cause. EXAM: CT HEAD WITHOUT CONTRAST TECHNIQUE: Contiguous axial images were obtained from the base of the skull through the vertex without intravenous contrast. RADIATION DOSE REDUCTION: This exam was performed according to the departmental dose-optimization program which includes automated exposure control, adjustment of the mA and/or kV according to Morgan Sandoval size and/or use of iterative reconstruction technique. COMPARISON:  MRI brain report 09/17/2017. Images unavailable in PACS at time of reading. FINDINGS: Brain: There is mild global atrophy and moderately developed small-vessel disease of the cerebral white matter. There is a chronic right  thalamic lacunar infarct. No acute cortical based infarct, hemorrhage, mass or mass effect, or midline shift are seen. There is trace mineralization in the posterior basal ganglia. Vascular: The carotid siphons are moderately calcified. No hyperdense central vessel is seen. Skull: Negative for fracture or focal lesion. Sinuses/Orbits: There is mild membrane disease in the maxillary and ethmoid sinuses without fluid level. Other sinuses are clear. Mild S shaped nasal septum. Old lens extractions. Other: Minimal fluid in both mastoid tips. Rest of the mastoids are clear. Both middle ears are clear. IMPRESSION: 1. No acute intracranial CT findings.  Chronic change. 2. Sinus membrane disease. 3. Fluid in the mastoid tips. 4. Carotid atherosclerosis. Electronically Signed   By: Telford Nab M.D.   On: 06/09/2022 00:51   DG Humerus Left  Result Date: 06/08/2022 CLINICAL DATA:  Recent fall with shoulder pain, initial encounter EXAM: LEFT HUMERUS -  2+ VIEW COMPARISON:  None Available. FINDINGS: There is a fracture involving the proximal left humerus at the level of the surgical neck. Focal avulsion involving the greater tuberosity is noted as well. Distal humerus is unremarkable. Remainder of the shoulder girdle appears within normal limits. IMPRESSION: Proximal left humeral fracture involving the surgical neck as well as the greater tuberosity. Electronically Signed   By: Inez Catalina M.D.   On: 06/08/2022 21:13   VAS [place] DOP BILAT COMP TOS DIGITS REYNAUD  Result Date: 05/30/2022 UPPER EXTREMITY DOPPLER STUDY Morgan Sandoval Name:  TESSIA KASSIN  Date of Exam:   05/30/2022 Medical Rec #: [id number]          Accession #:    [id number] Date of Birth: January 20, 1935           Morgan Sandoval Gender: F Morgan Sandoval Age:   89 years Exam Location:  [facility] Procedure:      VAS UE DOPPLER BILAT/COMP TOS, DIGITS (TO&UE REYNAUDS) Referring Phys: GREGORY SCHNIER  --------------------------------------------------------------------------------  Indications: Reynauds.  Performing Technologist: Almira Coaster RVS  Examination Guidelines: A complete evaluation includes B-mode imaging, spectral Doppler, color Doppler, and power Doppler as needed of all accessible portions of each vessel. Bilateral testing is considered an integral part of a complete examination. Limited examinations for reoccurring indications may be performed as noted.  Technologist Notes: Right: Digital PPG tracings obtained appear appropriately pulsatile. Left: Digital PPG tracings obtained appear appropriately pulsatile.  Electronically signed by Hortencia Pilar MD on 05/30/2022 at 4:41:03 PM.    Final     EKG: Sinus bradycardia nonspecific ST-T wave changes rate of 55  ASSESSMENT AND PLAN:  Syncope Bradycardia Borderline troponins Hypertension GERD COPD Pulmonary fibrosis Chronic renal insufficiency Raynaud's syndrome Fracture of the left shoulder . I agreed admit to telemetry Follow-up EKGs and troponins IV heparin until peak troponin 10 discontinue if they stay flat Consider echocardiogram for evaluation of left ventricular function and valvular structures Consider Holter monitor 3 to 7 days for bradycardia Agree with Protonix therapy for reflux type symptoms Continue inhalers pulmonary input for pulmonary fibrosis Recommend holding bisoprolol because of bradycardia when switching to a different antihypertensive At this point I am not convinced this is a non-STEMI and looks more like demand ischemia Continue hypertension management with lisinopril consider increasing to 20 mg with discontinue bisoprolol Unable to tolerate calcium blockers because lower extremity edema continue nitroglycerin for possible spasm avoid cold exposure to hands Agree with rheumatology input and evaluation currently on Plaquenil Do not recommend any invasive procedures at this stage from a cardiac  standpoint Consider carotid Dopplers with her recent episode of syncope   I was first notified about this Morgan Sandoval at about 10:30 AM by Dr. Fritzi Mandes.  I did not receive any notification from the previous physician overnight.  There was no haiku message or page a staff message.  I did receive a staff message notification about one of the Morgan Sandoval but not about this one until 10:30 AM.  Unfortunately I already rounded and had left the hospital when I was notified.  I discussed the case with Dr. Posey Pronto via Raeanne Gathers about discontinuing heparin and that I will see the Morgan Sandoval later today  Signed: Yolonda Kida MD 06/09/2022, 10:50 PM

## 2022-06-09 NOTE — Assessment & Plan Note (Signed)
-   We will continue her inhalers. 

## 2022-06-09 NOTE — Consult Note (Signed)
ORTHOPAEDIC CONSULTATION  REQUESTING PHYSICIAN: Fritzi Mandes, MD  Chief Complaint: Left shoulder pain  HPI: Morgan Sandoval is a 87 y.o. female who complains of left shoulder pain after a fall while walking home yesterday.  She apparently had a syncopal episode and is being worked up by the medical service for this.  She was brought to the emergency room where exam and x-rays revealed a nondisplaced fracture of the left humeral head she has been placed in a sling.  She says the pain is moderate to severe.  No other complaints are noted.  She denies numbness or tingling in the arm.  Past Medical History:  Diagnosis Date   Cholelithiasis    Chronic diarrhea    Colon polyp    Diverticulosis    GERD (gastroesophageal reflux disease)    if she eats acidic foods   Hypertension    Mucoid cyst of joint    left index finger   Osteoporosis    Past Surgical History:  Procedure Laterality Date   APPENDECTOMY     BIOPSY  05/19/2019   Procedure: BIOPSY;  Surgeon: Rogene Houston, MD;  [facility];  Service: Endoscopy;;  sigmoid colon   CHOLECYSTECTOMY     COLON SURGERY     Removed large polyps   COLONOSCOPY     COLONOSCOPY N/A 05/18/2015   Procedure: COLONOSCOPY;  Surgeon: Rogene Houston, MD;  [facility];  Service: Endoscopy;  Laterality: N/A;  1030   COLONOSCOPY N/A 05/19/2019   Procedure: COLONOSCOPY;  Surgeon: Rogene Houston, MD;  [facility];  Service: Endoscopy;  Laterality: N/A;  8:30   GANGLION CYST EXCISION Left 03/2019   the fore finger   IR VERTEBROPLASTY LUMBAR BX INC UNI/BIL INC/INJECT/IMAGING  07/27/2020   MASS EXCISION Left 03/02/2019   Procedure: EXCISION CYST, DEBRIDEMENT PROXIMAL INTERPHALANGEAL JOINT LEFT INDEX FINGER;  Surgeon: Daryll Brod, MD;  Location: [place];  Service: Orthopedics;  Laterality: Left;   RECTAL SURGERY  2000s   [facility]   TONSILLECTOMY     Social History   Socioeconomic History    Marital status: Widowed    Spouse name: Not on file   Number of children: Not on file   Years of education: Not on file   Highest education level: Not on file  Occupational History   Not on file  Tobacco Use   Smoking status: Former   Smokeless tobacco: Never  Vaping Use   Vaping Use: Never used  Substance and Sexual Activity   Alcohol use: Yes    Comment: occ   Drug use: No   Sexual activity: Not on file  Other Topics Concern   Not on file  Social History Narrative   Not on file   Social Determinants of Health   Financial Resource Strain: Not on file  Food Insecurity: Not on file  Transportation Needs: Not on file  Physical Activity: Not on file  Stress: Not on file  Social Connections: Not on file   Family History  Problem Relation Age of Onset   Cancer - Colon Brother    Allergies  Allergen Reactions   Hydrochlorothiazide    Sulfa Antibiotics Rash and Hives   Prior to Admission medications   Medication Sig Start Date End Date Taking? Authorizing Provider  alendronate (FOSAMAX) 70 MG tablet Take 70 mg by mouth every Sunday.   Yes [provider]  bisoprolol (ZEBETA) 5 MG tablet Take 1 tablet (5  mg total) by mouth daily. 04/15/22 07/14/22 Yes Carrie Mew, MD  cholecalciferol (VITAMIN D3) 25 MCG (1000 UNIT) tablet Take 1,000 Units by mouth daily.   Yes [provider]  Cyanocobalamin (B-12) 5000 MCG CAPS Take 5,000 mcg by mouth daily.   Yes [provider]  diclofenac sodium (VOLTAREN) 1 % GEL Apply 2 g topically 4 (four) times daily.   Yes [provider]  hydroxychloroquine (PLAQUENIL) 200 MG tablet Take 200 mg by mouth daily.   Yes [provider]  lisinopril (ZESTRIL) 5 MG tablet Take 2 tablets by mouth daily.   Yes [provider]  Multiple Vitamins-Minerals (PRESERVISION AREDS 2+MULTI VIT) CAPS Take 1 tablet by mouth daily.   Yes [provider]  mycophenolate (CELLCEPT) 500 MG tablet Take 500 mg  by mouth 2 (two) times daily. Before meals 08/23/21 08/23/22 Yes [provider]  pantoprazole (PROTONIX) 40 MG tablet Take 40 mg by mouth daily.   Yes [provider]  albuterol (VENTOLIN HFA) 108 (90 Base) MCG/ACT inhaler Inhale 2 puffs into the lungs every 6 (six) hours as needed for wheezing. 08/02/21 08/02/22  [provider]  ipratropium (ATROVENT) 0.06 % nasal spray Place into both nostrils. Patient not taking: Reported on 06/09/2022 02/01/22   [provider]  NITRO-BID 2 % ointment SMARTSIG:T-DERMAL Patient not taking: Reported on 06/09/2022 04/11/22   [provider]   DG Chest Port 1 View  Result Date: 06/09/2022 CLINICAL DATA:  Syncope EXAM: PORTABLE CHEST 1 VIEW COMPARISON:  10/24/2021.  Left humerus 06/08/2022 FINDINGS: Extensive chronic lung disease/fibrosis throughout the lungs bilaterally, left greater than right. No acute confluent airspace opacities or effusions. Heart is normal size. Left humeral neck fracture noted as seen on humerus series yesterday. IMPRESSION: Extensive chronic lung disease/fibrosis. No active disease. Electronically Signed   By: Rolm Baptise M.D.   On: 06/09/2022 01:58   CT Head Wo Contrast  Result Date: 06/09/2022 CLINICAL DATA:  Mental status changes, unknown cause. EXAM: CT HEAD WITHOUT CONTRAST TECHNIQUE: Contiguous axial images were obtained from the base of the skull through the vertex without intravenous contrast. RADIATION DOSE REDUCTION: This exam was performed according to the departmental dose-optimization program which includes automated exposure control, adjustment of the mA and/or kV according to patient size and/or use of iterative reconstruction technique. COMPARISON:  MRI brain report 09/17/2017. Images unavailable in PACS at time of reading. FINDINGS: Brain: There is mild global atrophy and moderately developed small-vessel disease of the cerebral white matter. There is a chronic right thalamic lacunar  infarct. No acute cortical based infarct, hemorrhage, mass or mass effect, or midline shift are seen. There is trace mineralization in the posterior basal ganglia. Vascular: The carotid siphons are moderately calcified. No hyperdense central vessel is seen. Skull: Negative for fracture or focal lesion. Sinuses/Orbits: There is mild membrane disease in the maxillary and ethmoid sinuses without fluid level. Other sinuses are clear. Mild S shaped nasal septum. Old lens extractions. Other: Minimal fluid in both mastoid tips. Rest of the mastoids are clear. Both middle ears are clear. IMPRESSION: 1. No acute intracranial CT findings.  Chronic change. 2. Sinus membrane disease. 3. Fluid in the mastoid tips. 4. Carotid atherosclerosis. Electronically Signed   By: Telford Nab M.D.   On: 06/09/2022 00:51   DG Humerus Left  Result Date: 06/08/2022 CLINICAL DATA:  Recent fall with shoulder pain, initial encounter EXAM: LEFT HUMERUS - 2+ VIEW COMPARISON:  None Available. FINDINGS: There is a fracture involving the  proximal left humerus at the level of the surgical neck. Focal avulsion involving the greater tuberosity is noted as well. Distal humerus is unremarkable. Remainder of the shoulder girdle appears within normal limits. IMPRESSION: Proximal left humeral fracture involving the surgical neck as well as the greater tuberosity. Electronically Signed   By: Inez Catalina M.D.   On: 06/08/2022 21:13    Positive ROS: All other systems have been reviewed and were otherwise negative with the exception of those mentioned in the HPI and as above.  Physical Exam: General: Alert, no acute distress Cardiovascular: No pedal edema Respiratory: No cyanosis, no use of accessory musculature GI: No organomegaly, abdomen is soft and non-tender Skin: No lesions in the area of chief complaint Neurologic: Sensation intact distally Psychiatric: Patient is competent for consent with normal mood and affect Lymphatic: No axillary  or cervical lymphadenopathy  MUSCULOSKELETAL: Tenderness and swelling around the proximal left humerus.  There is pain with passive movement.  Neurovascular status is good distally.  Head neck and spine are normal.  Right upper extremity and lower extremities are unremarkable.  Assessment: Nondisplaced left proximal humerus fracture  Plan: A sling will be adequate for this. She should follow-up in my office in 7 to 10 days. Pain medicine as needed. She may remove the sling to bathe.    Park Breed, MD [phone]   06/09/2022 12:04 PM

## 2022-06-09 NOTE — Assessment & Plan Note (Signed)
-   We will continue Zebeta and Zestril.

## 2022-06-09 NOTE — Progress Notes (Signed)
ANTICOAGULATION CONSULT NOTE  Pharmacy Consult for heparin infusion Indication: ACS/STEMI  Allergies  Allergen Reactions   Hydrochlorothiazide    Sulfa Antibiotics Rash and Hives    Patient Measurements: Height: '5\' 3"'$  (160 cm) Weight: 48.1 kg (106 lb) IBW/kg (Calculated) : 52.4 Heparin Dosing Weight: 48.1 kg  Vital Signs: Temp: 98.5 F (36.9 C) (08/26 2352) Temp Source: Oral (08/26 2352) BP: 143/75 (08/26 2352) Pulse Rate: 64 (08/26 2352)  Labs: Recent Labs    06/08/22 2037  HGB 13.9  HCT 42.9  PLT 217  CREATININE 1.15*  TROPONINIHS 299*    Estimated Creatinine Clearance: 27.2 mL/min (A) (by C-G formula based on SCr of 1.15 mg/dL (H)).   Medical History: Past Medical History:  Diagnosis Date   Cholelithiasis    Chronic diarrhea    Colon polyp    Diverticulosis    GERD (gastroesophageal reflux disease)    if she eats acidic foods   Hypertension    Mucoid cyst of joint    left index finger   Osteoporosis     Assessment: Pt is a 87 yo female with h/o falls presenting to ED after passing out, also c/o upper L arm pain & N/V x 1.  Goal of Therapy:  Heparin level 0.3-0.7 units/ml Monitor platelets by anticoagulation protocol: Yes   Plan:  Bolus 2900 units x 1 Start heparin infusion at 600 units/hr Will check HL in 8 hr after start of infusion CBC daily while on heparin  Renda Rolls, PharmD, [facility] 06/09/2022 1:39 AM

## 2022-06-09 NOTE — ED Notes (Signed)
Pt cleaned up of urinary incontinence. Purewick repositioned.

## 2022-06-09 NOTE — H&P (Signed)
[place]   PATIENT NAME: Morgan Sandoval    MR#:  [id number]  DATE OF BIRTH:  April 20, 1935  DATE OF ADMISSION:  06/09/2022  PRIMARY CARE PHYSICIAN: Dion Body, MD   Patient is coming from: Home  REQUESTING/REFERRING PHYSICIAN: Lurline Hare, MD  CHIEF COMPLAINT:   Chief Complaint  Patient presents with   Loss of Consciousness    HISTORY OF PRESENT ILLNESS:  Morgan Sandoval is a 87 y.o. Caucasian female with medical history significant for hypertension, GERD, and Raynaud's disease who presented to the emergency room with acute onset of syncope when she was walking back to her apartment at [facility] after visiting a friend.  She was having left upper arm pain and had vomiting for which she received 4 mg of IM Zofran by EMS en route to the ER.  No worsening cough or wheezing or dyspnea.  She denies any chest pain or palpitations.  No fever or chills.    No bleeding diathesis.  She she had dizziness earlier in the morning.  No bleeding diathesis.  ED Course: Upon presentation to the emergency room, BP was 143/75 with otherwise normal vital signs.  Labs revealed high sensitive troponin I of 299 and BMP revealed a creatinine of 1.15, previously 1.04 on 7/3 and calcium of 8.8 with blood glucose of 119.  CBC was within normal. High sensitive troponin I was 299. EKG as reviewed by me : EKG showed normal sinus rhythm with rate of 66 with left posterior fascicular block, prolonged QT interval with QTc of 482 MS and RSR-pattern and V1 and V2.  T wave inversion anteroseptally. Imaging: Noncontrasted CT scan revealed sinus membrane disease, fluid in the mastoid tips, carotid atherosclerosis with no acute intracranial abnormalities.  Portable chest x-ray showed extensive chronic lung disease/fibrosis with no active disease.  Left humerus x-ray showed proximal left humeral fracture involving the surgical neck as well as the greater tuberosity.  The patient was given 4 baby aspirin and 1  p.o. Norco.  She will be admitted to a progressive unit bed for further evaluation and management. PAST MEDICAL HISTORY:   Past Medical History:  Diagnosis Date   Cholelithiasis    Chronic diarrhea    Colon polyp    Diverticulosis    GERD (gastroesophageal reflux disease)    if she eats acidic foods   Hypertension    Mucoid cyst of joint    left index finger   Osteoporosis   -Raynaud's disease  PAST SURGICAL HISTORY:   Past Surgical History:  Procedure Laterality Date   APPENDECTOMY     BIOPSY  05/19/2019   Procedure: BIOPSY;  Surgeon: Rogene Houston, MD;  [facility];  Service: Endoscopy;;  sigmoid colon   CHOLECYSTECTOMY     COLON SURGERY     Removed large polyps   COLONOSCOPY     COLONOSCOPY N/A 05/18/2015   Procedure: COLONOSCOPY;  Surgeon: Rogene Houston, MD;  [facility];  Service: Endoscopy;  Laterality: N/A;  1030   COLONOSCOPY N/A 05/19/2019   Procedure: COLONOSCOPY;  Surgeon: Rogene Houston, MD;  Location: [facility];  Service: Endoscopy;  Laterality: N/A;  8:30   GANGLION CYST EXCISION Left 03/2019   the fore finger   IR VERTEBROPLASTY LUMBAR BX INC UNI/BIL INC/INJECT/IMAGING  07/27/2020   MASS EXCISION Left 03/02/2019   Procedure: EXCISION CYST, DEBRIDEMENT PROXIMAL INTERPHALANGEAL JOINT LEFT INDEX FINGER;  Surgeon: Daryll Brod, MD;  Location: [facility];  Service: Orthopedics;  Laterality: Left;   RECTAL SURGERY  2000s   [facility]   TONSILLECTOMY      SOCIAL HISTORY:   Social History   Tobacco Use   Smoking status: Former   Smokeless tobacco: Never  Substance Use Topics   Alcohol use: Yes    Comment: occ    FAMILY HISTORY:   Family History  Problem Relation Age of Onset   Cancer - Colon Brother     DRUG ALLERGIES:   Allergies  Allergen Reactions   Hydrochlorothiazide    Sulfa Antibiotics Rash and Hives    REVIEW OF SYSTEMS:   ROS As per history of present illness. All pertinent systems  were reviewed above. Constitutional, HEENT, cardiovascular, respiratory, GI, GU, musculoskeletal, neuro, psychiatric, endocrine, integumentary and hematologic systems were reviewed and are otherwise negative/unremarkable except for positive findings mentioned above in the HPI.   MEDICATIONS AT HOME:   Prior to Admission medications   Medication Sig Start Date End Date Taking? Authorizing Provider  albuterol (VENTOLIN HFA) 108 (90 Base) MCG/ACT inhaler Inhale 2 puffs into the lungs every 6 (six) hours as needed for wheezing. 08/02/21 08/02/22  [provider]  alendronate (FOSAMAX) 70 MG tablet Take 70 mg by mouth every Sunday.    [provider]  bisoprolol (ZEBETA) 5 MG tablet Take 1 tablet (5 mg total) by mouth daily. 04/15/22 07/14/22  Carrie Mew, MD  cholecalciferol (VITAMIN D3) 25 MCG (1000 UNIT) tablet Take 1,000 Units by mouth daily.    [provider]  Cyanocobalamin (B-12) 5000 MCG CAPS Take 5,000 mcg by mouth daily.    [provider]  diclofenac sodium (VOLTAREN) 1 % GEL Apply 2 g topically 4 (four) times daily.    [provider]  hydroxychloroquine (PLAQUENIL) 200 MG tablet Take 200 mg by mouth daily.    [provider]  ipratropium (ATROVENT) 0.06 % nasal spray Place into both nostrils. 02/01/22   [provider]  lisinopril (ZESTRIL) 10 MG tablet Take 1 tablet (10 mg total) by mouth daily. 10/25/21 11/24/21  Sidney Ace, MD  lisinopril (ZESTRIL) 5 MG tablet Take 1 tablet by mouth daily.    [provider]  Multiple Vitamins-Minerals (PRESERVISION AREDS 2+MULTI VIT) CAPS Take by mouth.    [provider]  mycophenolate (CELLCEPT) 500 MG tablet Take 500 mg by mouth 2 (two) times daily. Before meals 08/23/21 08/23/22  [provider]  NITRO-BID 2 % ointment SMARTSIG:T-DERMAL 04/11/22   [provider]  pantoprazole (PROTONIX) 40 MG tablet Take 40 mg by mouth daily.    [provider]      VITAL SIGNS:  Blood pressure (!) 150/77, pulse (!) 35, temperature 98.5 F (36.9 C), temperature source Oral, resp. rate (!) 25, height '5\' 3"'$  (1.6 m), weight 48.1 kg, SpO2 97 %.  PHYSICAL EXAMINATION:  Physical Exam  GENERAL:  87 y.o.-year-old Caucasian female patient lying in the bed with no acute distress.  EYES: Pupils equal, round, reactive to light and accommodation. No scleral icterus. Extraocular muscles intact.  HEENT: Head atraumatic, normocephalic. Oropharynx and nasopharynx clear.  NECK:  Supple, no jugular venous distention. No thyroid enlargement, no tenderness.  LUNGS: Normal breath sounds bilaterally, no wheezing, rales,rhonchi or crepitation. No use of accessory muscles of respiration.  CARDIOVASCULAR: Regular rate and rhythm, S1, S2 normal. No murmurs, rubs, or gallops.  ABDOMEN: Soft, nondistended, nontender. Bowel sounds present. No organomegaly or mass.  EXTREMITIES: No pedal edema, cyanosis, or  clubbing.  NEUROLOGIC: Cranial nerves II through XII are intact. Muscle strength 5/5 in all extremities. Sensation intact. Gait not checked.  PSYCHIATRIC: The patient is alert and oriented x 3.  Normal affect and good eye contact. SKIN: No obvious rash, lesion, or ulcer.   LABORATORY PANEL:   CBC Recent Labs  Lab 06/08/22 2037  WBC 8.4  HGB 13.9  HCT 42.9  PLT 217   ------------------------------------------------------------------------------------------------------------------  Chemistries  Recent Labs  Lab 06/08/22 2037  NA 138  K 4.0  CL 103  CO2 27  GLUCOSE 119*  BUN 21  CREATININE 1.15*  CALCIUM 8.8*   ------------------------------------------------------------------------------------------------------------------  Cardiac Enzymes No results for input(s): "TROPONINI" in the last 168 hours. ------------------------------------------------------------------------------------------------------------------  RADIOLOGY:  DG Chest  Port 1 View  Result Date: 06/09/2022 CLINICAL DATA:  Syncope EXAM: PORTABLE CHEST 1 VIEW COMPARISON:  10/24/2021.  Left humerus 06/08/2022 FINDINGS: Extensive chronic lung disease/fibrosis throughout the lungs bilaterally, left greater than right. No acute confluent airspace opacities or effusions. Heart is normal size. Left humeral neck fracture noted as seen on humerus series yesterday. IMPRESSION: Extensive chronic lung disease/fibrosis. No active disease. Electronically Signed   By: Rolm Baptise M.D.   On: 06/09/2022 01:58   CT Head Wo Contrast  Result Date: 06/09/2022 CLINICAL DATA:  Mental status changes, unknown cause. EXAM: CT HEAD WITHOUT CONTRAST TECHNIQUE: Contiguous axial images were obtained from the base of the skull through the vertex without intravenous contrast. RADIATION DOSE REDUCTION: This exam was performed according to the departmental dose-optimization program which includes automated exposure control, adjustment of the mA and/or kV according to patient size and/or use of iterative reconstruction technique. COMPARISON:  MRI brain report 09/17/2017. Images unavailable in PACS at time of reading. FINDINGS: Brain: There is mild global atrophy and moderately developed small-vessel disease of the cerebral white matter. There is a chronic right thalamic lacunar infarct. No acute cortical based infarct, hemorrhage, mass or mass effect, or midline shift are seen. There is trace mineralization in the posterior basal ganglia. Vascular: The carotid siphons are moderately calcified. No hyperdense central vessel is seen. Skull: Negative for fracture or focal lesion. Sinuses/Orbits: There is mild membrane disease in the maxillary and ethmoid sinuses without fluid level. Other sinuses are clear. Mild S shaped nasal septum. Old lens extractions. Other: Minimal fluid in both mastoid tips. Rest of the mastoids are clear. Both middle ears are clear. IMPRESSION: 1. No acute intracranial CT findings.   Chronic change. 2. Sinus membrane disease. 3. Fluid in the mastoid tips. 4. Carotid atherosclerosis. Electronically Signed   By: Telford Nab M.D.   On: 06/09/2022 00:51   DG Humerus Left  Result Date: 06/08/2022 CLINICAL DATA:  Recent fall with shoulder pain, initial encounter EXAM: LEFT HUMERUS - 2+ VIEW COMPARISON:  None Available. FINDINGS: There is a fracture involving the proximal left humerus at the level of the surgical neck. Focal avulsion involving the greater tuberosity is noted as well. Distal humerus is unremarkable. Remainder of the shoulder girdle appears within normal limits. IMPRESSION: Proximal left humeral fracture involving the surgical neck as well as the greater tuberosity. Electronically Signed   By: Inez Catalina M.D.   On: 06/08/2022 21:13      IMPRESSION AND PLAN:  Assessment and Plan: * NSTEMI (non-ST elevated myocardial infarction) [facility]) - The admitted will be admitted to a progressive unit bed. - We will continue her on IV heparin. - She will be placed on aspirin as well as beta-blocker therapy with Zebeta. -  We will add high-dose statin therapy. - She will be placed on as needed sublingual nitroglycerin and IV morphine sulfate for pain. - We will continue her Nitropaste. - Cardiology consult and 2D echo will be obtained. - I notified Dr. Clayborn Bigness about the patient.   Syncope, cardiogenic - This is clearly secondary to #1. - Management as above.  Essential hypertension - We will continue Zebeta and Zestril.  GERD (gastroesophageal reflux disease) - We will continue PPI therapy.  COPD with chronic bronchitis ([place]) - We will continue her inhalers.   DVT prophylaxis: IV heparin. Advanced Care Planning:  Code Status: full code.  Family Communication:  The plan of care was discussed in details with the patient (and family). I answered all questions. The patient agreed to proceed with the above mentioned plan. Further management will depend upon hospital  course. Disposition Plan: Back to previous home environment Consults called: Cardiology. All the records are reviewed and case discussed with ED provider.  Status is: Inpatient  At the time of the admission, it appears that the appropriate admission status for this patient is inpatient.  This is judged to be reasonable and necessary in order to provide the required intensity of service to ensure the patient's safety given the presenting symptoms, physical exam findings and initial radiographic and laboratory data in the context of comorbid conditions.  The patient requires inpatient status due to high intensity of service, high risk of further deterioration and high frequency of surveillance required.  I certify that at the time of admission, it is my clinical judgment that the patient will require inpatient hospital care extending more than 2 midnights.                            Dispo: The patient is from: Home              Anticipated d/c is to: Home              Patient currently is not medically stable to d/c.              Difficult to place patient: No  Christel Mormon M.D on 06/09/2022 at 2:20 AM  [facility]   From 7 PM-7 AM, contact night-coverage [email or web address]  CC: Primary care physician; Dion Body, MD

## 2022-06-09 NOTE — Assessment & Plan Note (Signed)
-   We will continue PPI therapy 

## 2022-06-09 NOTE — Progress Notes (Addendum)
Patient seen in the ER, chart reviewed, discussed with patient the events. Overall she is stable hemodynamically. Denies any chest pain or any history of coronary artery disease. Repeat troponin is 205. EKG no acute changes. Discussed with Dr. Clayborn Bigness and okay to discontinue heparin drip. Patient has had a few syncopal episodes in the past. This information was obtained from patient's daughter on the phone.  Left proximal humerus fracture post fall seen by Dr. Sabra Heck recommend sling and follow-up in the office.  Will start physical therapy occupational therapy and TOC for discharge planning. Patient is from [facility] independent facility.  Above was discussed with patient's daughter Morgan Sandoval on the phone.

## 2022-06-09 NOTE — Assessment & Plan Note (Addendum)
-   The admitted will be admitted to a progressive unit bed. - We will continue her on IV heparin. - She will be placed on aspirin as well as beta-blocker therapy with Zebeta. - We will add high-dose statin therapy. - She will be placed on as needed sublingual nitroglycerin and IV morphine sulfate for pain. - We will continue her Nitropaste. - Cardiology consult and 2D echo will be obtained. - I notified Dr. Clayborn Bigness about the patient.

## 2022-06-09 NOTE — Progress Notes (Signed)
ANTICOAGULATION CONSULT NOTE  Pharmacy Consult for heparin infusion Indication: ACS/STEMI  Allergies  Allergen Reactions   Hydrochlorothiazide    Sulfa Antibiotics Rash and Hives    Patient Measurements: Height: '5\' 3"'$  (160 cm) Weight: 48.1 kg (106 lb) IBW/kg (Calculated) : 52.4 Heparin Dosing Weight: 48.1 kg  Vital Signs: Temp: 98 F (36.7 C) (08/27 1130) Temp Source: Oral (08/27 1130) BP: 127/66 (08/27 1130) Pulse Rate: 53 (08/27 1130)  Labs: Recent Labs    06/08/22 2037 06/09/22 0229 06/09/22 1202  HGB 13.9  --   --   HCT 42.9  --   --   PLT 217  --   --   APTT  --  34  --   LABPROT  --  14.7  --   INR  --  1.2  --   HEPARINUNFRC  --   --  0.40  CREATININE 1.15*  --   --   TROPONINIHS 299*  --   --      Estimated Creatinine Clearance: 27.2 mL/min (A) (by C-G formula based on SCr of 1.15 mg/dL (H)).   Medical History: Past Medical History:  Diagnosis Date   Cholelithiasis    Chronic diarrhea    Colon polyp    Diverticulosis    GERD (gastroesophageal reflux disease)    if she eats acidic foods   Hypertension    Mucoid cyst of joint    left index finger   Osteoporosis     Assessment: Pt is a 87 yo female with h/o falls presenting to ED after passing out, also c/o upper L arm pain & N/V x 1.  8/27 1202 HL 0.4   Goal of Therapy:  Heparin level 0.3-0.7 units/ml Monitor platelets by anticoagulation protocol: Yes   Plan:  Heparin level is therapeutic. Will continue heparin infusion at 600 units/hr. Recheck heparin level in 8 hours. CBC daily while on heparin.   Eleonore Chiquito, PharmD, 06/09/2022 12:34 PM

## 2022-06-09 NOTE — ED Provider Notes (Signed)
[facility] Provider Note    Event Date/Time   First MD Initiated Contact with Patient 06/09/22 0110     (approximate)   History   Loss of Consciousness   HPI  Morgan Sandoval is a 87 y.o. female brought to the ED via EMS from [facility] status post syncope.  Patient was walking back to her apartment from visiting a friend and suddenly passed out.  Reports left upper arm pain.  Denies anticoagulant use.  Denies recent fever, cough, chest pain, shortness of breath, abdominal pain, nausea, vomiting or diarrhea.  Does endorse some dizziness this morning.  Patient vomited and received 4 mg IM Zofran per EMS prior to arrival.     Past Medical History   Past Medical History:  Diagnosis Date   Cholelithiasis    Chronic diarrhea    Colon polyp    Diverticulosis    GERD (gastroesophageal reflux disease)    if she eats acidic foods   Hypertension    Mucoid cyst of joint    left index finger   Osteoporosis      Active Problem List   Patient Active Problem List   Diagnosis Date Noted   NSTEMI (non-ST elevated myocardial infarction) ([place]) 06/09/2022   Syncope, cardiogenic 06/09/2022   Raynaud's disease 06/02/2022   Elevated troponin 10/25/2021   Hypomagnesemia 10/25/2021   GERD (gastroesophageal reflux disease) 10/25/2021   Essential hypertension 10/25/2021   Hypokalemia 10/24/2021   Hyponatremia 10/24/2021   Weakness 10/24/2021   AKI (acute kidney injury) ([place]) 10/24/2021   Sepsis without acute organ dysfunction (HCC)    PNA (pneumonia) 05/27/2021   History of tobacco use 05/27/2021   COPD with chronic bronchitis ([place]) 05/27/2021   Emphysema lung ([place]) 05/27/2021   Leukocytosis 05/27/2021   Spinal stenosis of lumbar region 11/30/2020   Lumbar pain 10/26/2020   Diarrhea of presumed infectious origin 04/29/2019   Heme positive stool 04/29/2019     Past Surgical History   Past Surgical History:  Procedure Laterality Date   APPENDECTOMY      BIOPSY  05/19/2019   Procedure: BIOPSY;  Surgeon: Rogene Houston, MD;  [facility];  Service: Endoscopy;;  sigmoid colon   CHOLECYSTECTOMY     COLON SURGERY     Removed large polyps   COLONOSCOPY     COLONOSCOPY N/A 05/18/2015   Procedure: COLONOSCOPY;  Surgeon: Rogene Houston, MD;  [facility];  Service: Endoscopy;  Laterality: N/A;  1030   COLONOSCOPY N/A 05/19/2019   Procedure: COLONOSCOPY;  Surgeon: Rogene Houston, MD;  [facility];  Service: Endoscopy;  Laterality: N/A;  8:30   GANGLION CYST EXCISION Left 03/2019   the fore finger   IR VERTEBROPLASTY LUMBAR BX INC UNI/BIL INC/INJECT/IMAGING  07/27/2020   MASS EXCISION Left 03/02/2019   Procedure: EXCISION CYST, DEBRIDEMENT PROXIMAL INTERPHALANGEAL JOINT LEFT INDEX FINGER;  Surgeon: Daryll Brod, MD;  Location: [place];  Service: Orthopedics;  Laterality: Left;   RECTAL SURGERY  2000s   [facility]   TONSILLECTOMY       Home Medications   Prior to Admission medications   Medication Sig Start Date End Date Taking? Authorizing Provider  alendronate (FOSAMAX) 70 MG tablet Take 70 mg by mouth every Sunday.   Yes [provider]  bisoprolol (ZEBETA) 5 MG tablet Take 1 tablet (5 mg total) by mouth daily. 04/15/22 07/14/22 Yes Carrie Mew, MD  cholecalciferol (VITAMIN D3) 25 MCG (  1000 UNIT) tablet Take 1,000 Units by mouth daily.   Yes [provider]  Cyanocobalamin (B-12) 5000 MCG CAPS Take 5,000 mcg by mouth daily.   Yes [provider]  diclofenac sodium (VOLTAREN) 1 % GEL Apply 2 g topically 4 (four) times daily.   Yes [provider]  hydroxychloroquine (PLAQUENIL) 200 MG tablet Take 200 mg by mouth daily.   Yes [provider]  lisinopril (ZESTRIL) 5 MG tablet Take 2 tablets by mouth daily.   Yes [provider]  Multiple Vitamins-Minerals (PRESERVISION AREDS 2+MULTI VIT) CAPS Take 1 tablet by mouth daily.   Yes  [provider]  mycophenolate (CELLCEPT) 500 MG tablet Take 500 mg by mouth 2 (two) times daily. Before meals 08/23/21 08/23/22 Yes [provider]  pantoprazole (PROTONIX) 40 MG tablet Take 40 mg by mouth daily.   Yes [provider]  albuterol (VENTOLIN HFA) 108 (90 Base) MCG/ACT inhaler Inhale 2 puffs into the lungs every 6 (six) hours as needed for wheezing. 08/02/21 08/02/22  [provider]  ipratropium (ATROVENT) 0.06 % nasal spray Place into both nostrils. Patient not taking: Reported on 06/09/2022 02/01/22   [provider]  NITRO-BID 2 % ointment SMARTSIG:T-DERMAL Patient not taking: Reported on 06/09/2022 04/11/22   [provider]     Allergies  Hydrochlorothiazide and Sulfa antibiotics   Family History   Family History  Problem Relation Age of Onset   Cancer - Colon Brother      Physical Exam  Triage Vital Signs: ED Triage Vitals  Enc Vitals Group     BP 06/08/22 2033 133/72     Pulse Rate 06/08/22 2033 71     Resp 06/08/22 2033 16     Temp 06/08/22 2033 98.2 F (36.8 C)     Temp Source 06/08/22 2033 Oral     SpO2 06/08/22 2033 91 %     Weight 06/08/22 2036 106 lb (48.1 kg)     Height 06/08/22 2036 '5\' 3"'$  (1.6 m)     Head Circumference --      Peak Flow --      Pain Score 06/08/22 2035 10     Pain Loc --      Pain Edu? --      Excl. in [place]? --     Updated Vital Signs: BP (!) 150/77   Pulse (!) 35   Temp 98.5 F (36.9 C) (Oral)   Resp (!) 25   Ht '5\' 3"'$  (1.6 m)   Wt 48.1 kg   SpO2 97%   BMI 18.78 kg/m    General: Awake, mild distress.  CV:  RRR.  Good peripheral perfusion.  Resp:  Normal effort.  CTA B. Abd:  Nontender.  No abdominal bruits.  No distention.  Other:  Left shoulder limited range of motion secondary to pain.  2+ radial pulses.  Brisk, less than 5-second capillary refill.  Small left elbow skin tear.  Full movement of elbow without pain.  Alert and oriented x3.  CN II toXII Slee  intact.  5/5 motor strength and sensation all extremities. MAEx4.    ED Results / Procedures / Treatments  Labs (all labs ordered are listed, but only abnormal results are displayed) Labs Reviewed  BASIC METABOLIC PANEL - Abnormal; Notable for the following components:      Result Value   Glucose, Bld 119 (*)    Creatinine, Ser 1.15 (*)    Calcium 8.8 (*)    GFR, Estimated  47 (*)    All other components within normal limits  TROPONIN I (HIGH SENSITIVITY) - Abnormal; Notable for the following components:   Troponin I (High Sensitivity) 299 (*)    All other components within normal limits  CBC  APTT  PROTIME-INR  URINALYSIS, ROUTINE W REFLEX MICROSCOPIC  LIPOPROTEIN A (LPA)  HEPARIN LEVEL (UNFRACTIONATED)  CBG MONITORING, ED     EKG  ED ECG REPORT I, Kemar Pandit J, the attending physician, personally viewed and interpreted this ECG.   Date: 06/09/2022  EKG Time: 2047  Rate: 66  Rhythm: normal sinus rhythm  Axis: Normal  Intervals: QTc 482  ST&T Change: Nonspecific    RADIOLOGY I have independently visualized patient's CT and x-ray as well as noted the radiology interpretation:  CT head: No ICH  Left humerus: Proximal left surgical neck fracture  Chest x-ray: Chronic lung disease  Official radiology report(s): DG Chest Port 1 View  Result Date: 06/09/2022 CLINICAL DATA:  Syncope EXAM: PORTABLE CHEST 1 VIEW COMPARISON:  10/24/2021.  Left humerus 06/08/2022 FINDINGS: Extensive chronic lung disease/fibrosis throughout the lungs bilaterally, left greater than right. No acute confluent airspace opacities or effusions. Heart is normal size. Left humeral neck fracture noted as seen on humerus series yesterday. IMPRESSION: Extensive chronic lung disease/fibrosis. No active disease. Electronically Signed   By: Rolm Baptise M.D.   On: 06/09/2022 01:58   CT Head Wo Contrast  Result Date: 06/09/2022 CLINICAL DATA:  Mental status changes, unknown cause. EXAM: CT HEAD WITHOUT  CONTRAST TECHNIQUE: Contiguous axial images were obtained from the base of the skull through the vertex without intravenous contrast. RADIATION DOSE REDUCTION: This exam was performed according to the departmental dose-optimization program which includes automated exposure control, adjustment of the mA and/or kV according to patient size and/or use of iterative reconstruction technique. COMPARISON:  MRI brain report 09/17/2017. Images unavailable in PACS at time of reading. FINDINGS: Brain: There is mild global atrophy and moderately developed small-vessel disease of the cerebral white matter. There is a chronic right thalamic lacunar infarct. No acute cortical based infarct, hemorrhage, mass or mass effect, or midline shift are seen. There is trace mineralization in the posterior basal ganglia. Vascular: The carotid siphons are moderately calcified. No hyperdense central vessel is seen. Skull: Negative for fracture or focal lesion. Sinuses/Orbits: There is mild membrane disease in the maxillary and ethmoid sinuses without fluid level. Other sinuses are clear. Mild S shaped nasal septum. Old lens extractions. Other: Minimal fluid in both mastoid tips. Rest of the mastoids are clear. Both middle ears are clear. IMPRESSION: 1. No acute intracranial CT findings.  Chronic change. 2. Sinus membrane disease. 3. Fluid in the mastoid tips. 4. Carotid atherosclerosis. Electronically Signed   By: Telford Nab M.D.   On: 06/09/2022 00:51   DG Humerus Left  Result Date: 06/08/2022 CLINICAL DATA:  Recent fall with shoulder pain, initial encounter EXAM: LEFT HUMERUS - 2+ VIEW COMPARISON:  None Available. FINDINGS: There is a fracture involving the proximal left humerus at the level of the surgical neck. Focal avulsion involving the greater tuberosity is noted as well. Distal humerus is unremarkable. Remainder of the shoulder girdle appears within normal limits. IMPRESSION: Proximal left humeral fracture involving the  surgical neck as well as the greater tuberosity. Electronically Signed   By: Inez Catalina M.D.   On: 06/08/2022 21:13     PROCEDURES:  Critical Care performed: Yes CRITICAL CARE Performed by: Paulette Blanch   Total critical care time: 30 minutes  Critical care time was exclusive of separately billable procedures and treating other patients.  Critical care was necessary to treat or prevent imminent or life-threatening deterioration.  Critical care was time spent personally by me on the following activities: development of treatment plan with patient and/or surrogate as well as nursing, discussions with consultants, evaluation of patient's response to treatment, examination of patient, obtaining history from patient or surrogate, ordering and performing treatments and interventions, ordering and review of laboratory studies, ordering and review of radiographic studies, pulse oximetry and re-evaluation of patient's condition.   Marland Kitchen1-3 Lead EKG Interpretation  Performed by: Paulette Blanch, MD Authorized by: Paulette Blanch, MD     Interpretation: normal     ECG rate:  66   ECG rate assessment: normal     Rhythm: sinus rhythm     Ectopy: none     Conduction: normal   Comments:     Patient placed on cardiac monitor to evaluate for arrhythmias    MEDICATIONS ORDERED IN ED: Medications  heparin ADULT infusion 100 units/mL (25000 units/276m) (600 Units/hr Intravenous New Bag/Given 06/09/22 0234)  hydroxychloroquine (PLAQUENIL) tablet 200 mg (has no administration in time range)  bisoprolol (ZEBETA) tablet 5 mg (has no administration in time range)  lisinopril (ZESTRIL) tablet 10 mg (has no administration in time range)  pantoprazole (PROTONIX) EC tablet 40 mg (has no administration in time range)  cyanocobalamin (VITAMIN B12) tablet 2,000 mcg (has no administration in time range)  cholecalciferol (VITAMIN D3) 25 MCG (1000 UNIT) tablet 1,000 Units (has no administration in time range)   mycophenolate (CELLCEPT) capsule 500 mg (has no administration in time range)  aspirin chewable tablet 324 mg (324 mg Oral Not Given 06/09/22 0242)    Or  aspirin suppository 300 mg ( Rectal See Alternative 06/09/22 0242)  aspirin EC tablet 81 mg (has no administration in time range)  nitroGLYCERIN (NITROSTAT) SL tablet 0.4 mg (has no administration in time range)  acetaminophen (TYLENOL) tablet 650 mg (has no administration in time range)  ondansetron (ZOFRAN) injection 4 mg (has no administration in time range)  0.9 %  sodium chloride infusion ( Intravenous New Bag/Given 06/09/22 0353)  ALPRAZolam (XANAX) tablet 0.25 mg (has no administration in time range)  traZODone (DESYREL) tablet 25 mg (has no administration in time range)  magnesium hydroxide (MILK OF MAGNESIA) suspension 30 mL (has no administration in time range)  atorvastatin (LIPITOR) tablet 80 mg (has no administration in time range)  albuterol (PROVENTIL) (2.5 MG/3ML) 0.083% nebulizer solution 2.5 mg (has no administration in time range)  sodium chloride 0.9 % bolus 500 mL (500 mLs Intravenous New Bag/Given 06/09/22 0235)  HYDROcodone-acetaminophen (NORCO/VICODIN) 5-325 MG per tablet 1 tablet (1 tablet Oral Given 06/09/22 0144)  aspirin chewable tablet 324 mg (324 mg Oral Given 06/09/22 0144)  heparin bolus via infusion 2,900 Units (2,900 Units Intravenous Bolus from Bag 06/09/22 0235)     IMPRESSION / [place] / [place]/ ED COURSE  I reviewed the triage vital signs and the nursing notes.                             87year old female presenting with syncope.  Differential diagnosis includes but is not limited to [place] CVA, ACS, metabolic, infectious etiologies, etc.  I have personally reviewed patient's records and note a vascular surgery visit on 05/30/2022 for hypertension, Raynaud's disease without gangrene.  Patient's presentation is most consistent with acute  presentation with potential threat to life or bodily  function.  The patient is on the cardiac monitor to evaluate for evidence of arrhythmia and/or significant heart rate changes.  Laboratory results demonstrate normal WBC 8.4, AKI creatinine 1.15, CT head negative for ICH, left humerus x-ray demonstrates proximal surgical neck fracture.  Have added troponin and chest x-ray.  Awaiting urine sample.  Patient noted to be 91% on room air initially upon her arrival but currently 94% on room air.  Will administer IV hydration, Norco for pain.  Anticipate hospitalization.  Clinical Course as of 06/09/22 0411  Sun Jun 09, 2022  0133 Critical troponin level.  Will administer baby aspirin, initiate heparin bolus with infusion.  Will consult hospitalist services for evaluation and admission. [JS]    Clinical Course User Index [JS] Paulette Blanch, MD     FINAL CLINICAL IMPRESSION(S) / ED DIAGNOSES   Final diagnoses:  Syncope, unspecified syncope type  Closed nondisplaced fracture of surgical neck of left humerus, unspecified fracture morphology, initial encounter  Skin tear of elbow without complication, initial encounter  NSTEMI (non-ST elevated myocardial infarction) ([place])     Rx / DC Orders   ED Discharge Orders     None        Note:  This document was prepared using Dragon voice recognition software and may include unintentional dictation errors.   Paulette Blanch, MD 06/09/22 [phone]

## 2022-06-10 DIAGNOSIS — S51019A Laceration without foreign body of unspecified elbow, initial encounter: Secondary | ICD-10-CM | POA: Diagnosis not present

## 2022-06-10 DIAGNOSIS — J449 Chronic obstructive pulmonary disease, unspecified: Secondary | ICD-10-CM | POA: Diagnosis not present

## 2022-06-10 DIAGNOSIS — S42215A Unspecified nondisplaced fracture of surgical neck of left humerus, initial encounter for closed fracture: Secondary | ICD-10-CM | POA: Diagnosis not present

## 2022-06-10 DIAGNOSIS — R55 Syncope and collapse: Secondary | ICD-10-CM | POA: Diagnosis not present

## 2022-06-10 LAB — URINALYSIS, ROUTINE W REFLEX MICROSCOPIC
Bilirubin Urine: NEGATIVE
Glucose, UA: NEGATIVE mg/dL
Hgb urine dipstick: NEGATIVE
Ketones, ur: NEGATIVE mg/dL
Leukocytes,Ua: NEGATIVE
Nitrite: NEGATIVE
Protein, ur: NEGATIVE mg/dL
Specific Gravity, Urine: 1.027 (ref 1.005–1.030)
pH: 5 (ref 5.0–8.0)

## 2022-06-10 MED ORDER — LISINOPRIL 10 MG PO TABS
20.0000 mg | ORAL_TABLET | Freq: Every day | ORAL | Status: DC
  Administered 2022-06-11 – 2022-06-12 (×2): 20 mg via ORAL
  Filled 2022-06-10 (×2): qty 2

## 2022-06-10 MED ORDER — ENOXAPARIN SODIUM 30 MG/0.3ML IJ SOSY
30.0000 mg | PREFILLED_SYRINGE | INTRAMUSCULAR | Status: DC
  Administered 2022-06-10 – 2022-06-11 (×2): 30 mg via SUBCUTANEOUS
  Filled 2022-06-10 (×2): qty 0.3

## 2022-06-10 NOTE — Progress Notes (Addendum)
Patient was seen evaluated and examined by me and the PA on 06/10/22.  Course of action, evaluation, and management decisions were developed solely by me, but detailed below in the PA's note.  [place] NOTE       Patient ID: Morgan Sandoval MRN: [id number] DOB/AGE: 85/01/1935 85 y.o.  Admit date: 06/09/2022 Referring Physician Dr. Eugenie Norrie Primary Physician Dr. Netty Starring Primary Cardiologist none Reason for Consultation syncope, bradycardia, elevated troponin  HPI: Morgan Sandoval is an 86yoF with a PMH of hypertension, COPD/emphysema, RA, CKD 3, history of tobacco use who presented to [facility] ED after a syncopal episode with concern for a cardiac cause, she suffered a nondisplaced left humeral head fracture as a result.  A troponin was checked which peaked at 299 in the absence of chest pain or EKG changes, likely representing demand ischemia.  Interval history: -No acute events -Remains in sinus bradycardia to sinus rhythm on telemetry with heart rates in the high 40s to 70s without evidence of sinus pauses, high degree AV block -No chest pain, palpitations, dizziness, shortness of breath.  Review of systems complete and found to be negative unless listed above     Past Medical History:  Diagnosis Date   Cholelithiasis    Chronic diarrhea    Colon polyp    Diverticulosis    GERD (gastroesophageal reflux disease)    if she eats acidic foods   Hypertension    Mucoid cyst of joint    left index finger   Osteoporosis     Past Surgical History:  Procedure Laterality Date   APPENDECTOMY     BIOPSY  05/19/2019   Procedure: BIOPSY;  Surgeon: Rogene Houston, MD;  [facility];  Service: Endoscopy;;  sigmoid colon   CHOLECYSTECTOMY     COLON SURGERY     Removed large polyps   COLONOSCOPY     COLONOSCOPY N/A 05/18/2015   Procedure: COLONOSCOPY;  Surgeon: Rogene Houston, MD;  [facility]: [facility];  Service: Endoscopy;  Laterality: N/A;  1030    COLONOSCOPY N/A 05/19/2019   Procedure: COLONOSCOPY;  Surgeon: Rogene Houston, MD;  [facility];  Service: Endoscopy;  Laterality: N/A;  8:30   GANGLION CYST EXCISION Left 03/2019   the fore finger   IR VERTEBROPLASTY LUMBAR BX INC UNI/BIL INC/INJECT/IMAGING  07/27/2020   MASS EXCISION Left 03/02/2019   Procedure: EXCISION CYST, DEBRIDEMENT PROXIMAL INTERPHALANGEAL JOINT LEFT INDEX FINGER;  Surgeon: Daryll Brod, MD;  Location: [place];  Service: Orthopedics;  Laterality: Left;   RECTAL SURGERY  2000s   [facility]   TONSILLECTOMY      (Not in a hospital admission)  Social History   Socioeconomic History   Marital status: Widowed    Spouse name: Not on file   Number of children: Not on file   Years of education: Not on file   Highest education level: Not on file  Occupational History   Not on file  Tobacco Use   Smoking status: Former   Smokeless tobacco: Never  Vaping Use   Vaping Use: Never used  Substance and Sexual Activity   Alcohol use: Yes    Comment: occ   Drug use: No   Sexual activity: Not on file  Other Topics Concern   Not on file  Social History Narrative   Not on file   Social Determinants of Health   Financial Resource Strain: Not on file  Food Insecurity:  Not on file  Transportation Needs: Not on file  Physical Activity: Not on file  Stress: Not on file  Social Connections: Not on file  Intimate Partner Violence: Not on file    Family History  Problem Relation Age of Onset   Cancer - Colon Brother       PHYSICAL EXAM General: Elderly and frail-appearing Caucasian female, in no acute distress. HEENT:  Normocephalic and atraumatic. Neck:  No JVD.  Lungs: Normal respiratory effort on 2 L by nasal cannula.  Coarse breath sounds throughout  heart: HRRR . Normal S1 and S2 without gallops or murmurs.  Abdomen: Non-distended appearing.  Msk: Normal strength and tone for age. Extremities: Warm and well perfused.   Left upper extremity in sling no clubbing, cyanosis.  No lower extremity edema.  Neuro: Alert and oriented X 3. Psych:  Answers questions appropriately.   Labs: Basic Metabolic Panel: Recent Labs    06/08/22 2037  NA 138  K 4.0  CL 103  CO2 27  GLUCOSE 119*  BUN 21  CREATININE 1.15*  CALCIUM 8.8*   Liver Function Tests: No results for input(s): "AST", "ALT", "ALKPHOS", "BILITOT", "PROT", "ALBUMIN" in the last 72 hours. No results for input(s): "LIPASE", "AMYLASE" in the last 72 hours. CBC: Recent Labs    06/08/22 2037  WBC 8.4  HGB 13.9  HCT 42.9  MCV 98.2  PLT 217   Cardiac Enzymes: Recent Labs    06/08/22 2037 06/09/22 1202  TROPONINIHS 299* 205*   BNP: Invalid input(s): "POCBNP" D-Dimer: No results for input(s): "DDIMER" in the last 72 hours. Hemoglobin A1C: No results for input(s): "HGBA1C" in the last 72 hours. Fasting Lipid Panel: No results for input(s): "CHOL", "HDL", "LDLCALC", "TRIG", "CHOLHDL", "LDLDIRECT" in the last 72 hours. Thyroid Function Tests: No results for input(s): "TSH", "T4TOTAL", "T3FREE", "THYROIDAB" in the last 72 hours.  Invalid input(s): "FREET3" Anemia Panel: No results for input(s): "VITAMINB12", "FOLATE", "FERRITIN", "TIBC", "IRON", "RETICCTPCT" in the last 72 hours.  DG Chest Port 1 View  Result Date: 06/09/2022 CLINICAL DATA:  Syncope EXAM: PORTABLE CHEST 1 VIEW COMPARISON:  10/24/2021.  Left humerus 06/08/2022 FINDINGS: Extensive chronic lung disease/fibrosis throughout the lungs bilaterally, left greater than right. No acute confluent airspace opacities or effusions. Heart is normal size. Left humeral neck fracture noted as seen on humerus series yesterday. IMPRESSION: Extensive chronic lung disease/fibrosis. No active disease. Electronically Signed   By: Rolm Baptise M.D.   On: 06/09/2022 01:58   CT Head Wo Contrast  Result Date: 06/09/2022 CLINICAL DATA:  Mental status changes, unknown cause. EXAM: CT HEAD WITHOUT  CONTRAST TECHNIQUE: Contiguous axial images were obtained from the base of the skull through the vertex without intravenous contrast. RADIATION DOSE REDUCTION: This exam was performed according to the departmental dose-optimization program which includes automated exposure control, adjustment of the mA and/or kV according to patient size and/or use of iterative reconstruction technique. COMPARISON:  MRI brain report 09/17/2017. Images unavailable in PACS at time of reading. FINDINGS: Brain: There is mild global atrophy and moderately developed small-vessel disease of the cerebral white matter. There is a chronic right thalamic lacunar infarct. No acute cortical based infarct, hemorrhage, mass or mass effect, or midline shift are seen. There is trace mineralization in the posterior basal ganglia. Vascular: The carotid siphons are moderately calcified. No hyperdense central vessel is seen. Skull: Negative for fracture or focal lesion. Sinuses/Orbits: There is mild membrane disease in the maxillary and ethmoid sinuses without fluid level. Other sinuses  are clear. Mild S shaped nasal septum. Old lens extractions. Other: Minimal fluid in both mastoid tips. Rest of the mastoids are clear. Both middle ears are clear. IMPRESSION: 1. No acute intracranial CT findings.  Chronic change. 2. Sinus membrane disease. 3. Fluid in the mastoid tips. 4. Carotid atherosclerosis. Electronically Signed   By: Telford Nab M.D.   On: 06/09/2022 00:51   DG Humerus Left  Result Date: 06/08/2022 CLINICAL DATA:  Recent fall with shoulder pain, initial encounter EXAM: LEFT HUMERUS - 2+ VIEW COMPARISON:  None Available. FINDINGS: There is a fracture involving the proximal left humerus at the level of the surgical neck. Focal avulsion involving the greater tuberosity is noted as well. Distal humerus is unremarkable. Remainder of the shoulder girdle appears within normal limits. IMPRESSION: Proximal left humeral fracture involving the  surgical neck as well as the greater tuberosity. Electronically Signed   By: Inez Catalina M.D.   On: 06/08/2022 21:13     Radiology: [facility] Chest Port 1 View  Result Date: 06/09/2022 CLINICAL DATA:  Syncope EXAM: PORTABLE CHEST 1 VIEW COMPARISON:  10/24/2021.  Left humerus 06/08/2022 FINDINGS: Extensive chronic lung disease/fibrosis throughout the lungs bilaterally, left greater than right. No acute confluent airspace opacities or effusions. Heart is normal size. Left humeral neck fracture noted as seen on humerus series yesterday. IMPRESSION: Extensive chronic lung disease/fibrosis. No active disease. Electronically Signed   By: Rolm Baptise M.D.   On: 06/09/2022 01:58   CT Head Wo Contrast  Result Date: 06/09/2022 CLINICAL DATA:  Mental status changes, unknown cause. EXAM: CT HEAD WITHOUT CONTRAST TECHNIQUE: Contiguous axial images were obtained from the base of the skull through the vertex without intravenous contrast. RADIATION DOSE REDUCTION: This exam was performed according to the departmental dose-optimization program which includes automated exposure control, adjustment of the mA and/or kV according to patient size and/or use of iterative reconstruction technique. COMPARISON:  MRI brain report 09/17/2017. Images unavailable in PACS at time of reading. FINDINGS: Brain: There is mild global atrophy and moderately developed small-vessel disease of the cerebral white matter. There is a chronic right thalamic lacunar infarct. No acute cortical based infarct, hemorrhage, mass or mass effect, or midline shift are seen. There is trace mineralization in the posterior basal ganglia. Vascular: The carotid siphons are moderately calcified. No hyperdense central vessel is seen. Skull: Negative for fracture or focal lesion. Sinuses/Orbits: There is mild membrane disease in the maxillary and ethmoid sinuses without fluid level. Other sinuses are clear. Mild S shaped nasal septum. Old lens extractions. Other: Minimal  fluid in both mastoid tips. Rest of the mastoids are clear. Both middle ears are clear. IMPRESSION: 1. No acute intracranial CT findings.  Chronic change. 2. Sinus membrane disease. 3. Fluid in the mastoid tips. 4. Carotid atherosclerosis. Electronically Signed   By: Telford Nab M.D.   On: 06/09/2022 00:51   DG Humerus Left  Result Date: 06/08/2022 CLINICAL DATA:  Recent fall with shoulder pain, initial encounter EXAM: LEFT HUMERUS - 2+ VIEW COMPARISON:  None Available. FINDINGS: There is a fracture involving the proximal left humerus at the level of the surgical neck. Focal avulsion involving the greater tuberosity is noted as well. Distal humerus is unremarkable. Remainder of the shoulder girdle appears within normal limits. IMPRESSION: Proximal left humeral fracture involving the surgical neck as well as the greater tuberosity. Electronically Signed   By: Inez Catalina M.D.   On: 06/08/2022 21:13   VAS [place] DOP BILAT COMP TOS DIGITS REYNAUD  Result Date: 05/30/2022 UPPER EXTREMITY DOPPLER STUDY Patient Name:  Morgan Sandoval  Date of Exam:   05/30/2022 Medical Rec #: [id number]          Accession #:    [id number] Date of Birth: [birth year]           Patient Gender: F Patient Age:   22 years Exam Location:  [facility] Procedure:      VAS UE DOPPLER BILAT/COMP TOS, DIGITS (TO&UE REYNAUDS) Referring Phys: GREGORY SCHNIER --------------------------------------------------------------------------------  Indications: Reynauds.  Performing Technologist: Almira Coaster RVS  Examination Guidelines: A complete evaluation includes B-mode imaging, spectral Doppler, color Doppler, and power Doppler as needed of all accessible portions of each vessel. Bilateral testing is considered an integral part of a complete examination. Limited examinations for reoccurring indications may be performed as noted.  Technologist Notes: Right: Digital PPG tracings obtained appear appropriately pulsatile. Left: Digital PPG  tracings obtained appear appropriately pulsatile.  Electronically signed by Hortencia Pilar MD on 05/30/2022 at 4:41:03 PM.    Final     ECHO no prior  TELEMETRY reviewed by me (LT) 06/10/2022 : Sinus bradycardia with rates in the high 40s to sinus rhythm with rates in the 60s to 70s.  No evidence of high degree AV block, or sinus pauses on telemetry  EKG reviewed by me: Sinus rhythm rate 66, nonspecific ST-T wave abnormality.  Data reviewed by me (LT) 06/10/2022: ED note, admission H&P, hospitalist progress note, troponin CBC, BMP, urinalysis, chest x-ray EKG  ASSESSMENT AND PLAN:  Morgan Sandoval MCKIERNAN is an 4yoF with a PMH of hypertension, COPD/emphysema, RA, CKD 3, history of tobacco use who presented to [facility] ED after a syncopal episode with concern for a cardiac cause, she suffered a nondisplaced left humeral head fracture as a result.  A troponin was checked which peaked at 299 in the absence of chest pain or EKG changes, likely representing demand ischemia.  #Syncope #Nondisplaced left humeral head fracture #Elevated troponin #Hypertension No evidence of high degree AV block or sinus pauses on telemetry to explain her syncopal episode.  Notably, she received her bisoprolol 5 mg the morning of 8/27, I discontinued further dosing for today.  Her troponin peaked at 299, and in the absence of chest pain or EKG changes this likely represents demand ischemia, possibly from physiologic stress of her humeral head fracture. -Continue aspirin 81 mg daily -Stop heparin drip -Stop bisoprolol 5 mg, do not resume at discharge -Increase home lisinopril from 10 mg once daily to 20 mg once daily -We will have [facility] nursing staff place a 7-day Holter monitor for further evaluation of arrhythmias and bradycardia -Echocardiogram can be completed on an outpatient basis -No further cardiac diagnostics necessary, okay for discharge today from a cardiac standpoint with follow-up with Dr. Clayborn Bigness in 2  weeks, or sooner if needed.   This patient's plan of care was discussed and created with Dr. Clayborn Bigness and he is in agreement.  Signed: Tristan Schroeder , PA-C 06/10/2022, 11:02 AM [facility]

## 2022-06-10 NOTE — Evaluation (Signed)
Physical Therapy Evaluation Patient Details Name: Morgan Sandoval MRN: [id number] DOB: 11/09/1934 Today's Date: 06/10/2022  History of Present Illness  Morgan Sandoval is an 41yoF who comes to [facility] on 8/26 from [facility] with a recent fall, LUE pain, N/V. Pt reports she was waklign down the hall and passed out.  PMH: RA, HTN, COPD, bronchitis, spinal stenosis, tobacco abuse, osteoporosis, GERD. Pt admitted in August (per pt) for PNA, PF followed by outpatient pulmonology. Scan report includes "Proximal left humeral fracture involving the surgical neck as well as the greater tuberosity." Orthopedic consult recommendling conservative management with sling and FU in 7-10 days.  Clinical Impression  Pt admitted with above diagnosis. Pt currently with functional limitations due to the deficits listed below (see "PT Problem List"). Upon entry, pt in bed, awake and agreeable to participate. Bed mobility performed without difficulty or physical assist, similar with sitting to standing. Pt unable to maintain standing, posterior sway and collapse toward bed multiple times while attempting bedside. Pt trialed on room air without success, desat to lower 80s, upper 70s%, placed back on O2. Pt has known PF followed by pulonology as OP, but no home O2 use. It is not clear that pt will be able to remain safe and mobile with LUE sling in place, also pt is unable to independently don/doff her LUE sling. Patient's performance this date reveals decreased ability, independence, and tolerance in performing all basic mobility required for performance of activities of daily living. Pt requires additional DME, close physical assistance, and cues for safe participate in mobility. Pt will benefit from skilled PT intervention to increase independence and safety with basic mobility in preparation for discharge to the venue listed below.         Recommendations for follow up therapy are one component of a  multi-disciplinary discharge planning process, led by the attending physician.  Recommendations may be updated based on patient status, additional functional criteria and insurance authorization.  Follow Up Recommendations Skilled nursing-short term rehab (<3 hours/day) Can patient physically be transported by private vehicle: Yes    Assistance Recommended at Discharge None  Patient can return home with the following  A little help with walking and/or transfers    Equipment Recommendations None recommended by PT  Recommendations for Other Services       Functional Status Assessment Patient has had a recent decline in their functional status and demonstrates the ability to make significant improvements in function in a reasonable and predictable amount of time.     Precautions / Restrictions Precautions Precautions: Fall Restrictions Weight Bearing Restrictions: No      Mobility  Bed Mobility Overal bed mobility: Modified Independent             General bed mobility comments: good strenght, limited awareness of line/leads; unclear she could adjust or manage sling independently.    Transfers Overall transfer level: Needs assistance Equipment used: None Transfers: Sit to/from Stand Sit to Stand: Min guard, Min assist           General transfer comment: able to rise multiple times, several LOB with poor ability to anticipate or LOB, requires author to facilitate physical support at times.    Ambulation/Gait Ambulation/Gait assistance:  (deferred at eval; dest and LOB with standing each time)                Stairs            Wheelchair Mobility    Modified Rankin (Stroke Patients  Only)       Balance                                             Pertinent Vitals/Pain Pain Assessment Pain Assessment: 0-10 Pain Score: 7  Pain Location: Left shoulder Pain Descriptors / Indicators: Aching Pain Intervention(s): Limited activity  within patient's tolerance, Monitored during session, Premedicated before session    Home Living Family/patient expects to be discharged to:: Private residence Living Arrangements: Alone   Type of Home: Independent living facility Home Access: Level entry       Home Layout: One level Home Equipment: Conservation officer, nature (2 wheels) Additional Comments: Has not used RW for >23month   Prior Function Prior Level of Function : Independent/Modified Independent             Mobility Comments: typically independent, does nto walk a lot outside due to the Summer heat ADLs Comments: typically independent     Hand Dominance        Extremity/Trunk Assessment                Communication      Cognition Arousal/Alertness: Awake/alert Behavior During Therapy: Restless, Anxious Overall Cognitive Status: Difficult to assess                                 General Comments: tangential responses to questioning, difficult to obtain direct answers often        General Comments      Exercises     Assessment/Plan    PT Assessment Patient needs continued PT services  PT Problem List         PT Treatment Interventions DME instruction;Balance training;Functional mobility training;Therapeutic activities;Patient/family education    PT Goals (Current goals can be found in the Care Plan section)  Acute Rehab PT Goals Patient Stated Goal: stop falling PT Goal Formulation: With patient Time For Goal Achievement: 06/24/22 Potential to Achieve Goals: Good    Frequency 7X/week     Co-evaluation               AM-PAC PT "6 Clicks" Mobility  Outcome Measure Help needed turning from your back to your side while in a flat bed without using bedrails?: None Help needed moving from lying on your back to sitting on the side of a flat bed without using bedrails?: None Help needed moving to and from a bed to a chair (including a wheelchair)?: A Little Help needed  standing up from a chair using your arms (e.g., wheelchair or bedside chair)?: A Little Help needed to walk in hospital room?: A Lot Help needed climbing 3-5 steps with a railing? : A Lot 6 Click Score: 18    End of Session   Activity Tolerance: Patient tolerated treatment well;No increased pain Patient left: in bed;with call bell/phone within reach Nurse Communication: Mobility status PT Visit Diagnosis: Unsteadiness on feet (R26.81);Difficulty in walking, not elsewhere classified (R26.2)    Time: [address]-8119PT Time Calculation (min) (ACUTE ONLY): 17 min   Charges:   PT Evaluation $PT Eval Moderate Complexity: 1 Mod         10:32 AM, 06/10/22 AEtta Grandchild PT, DPT Physical Therapist - [facility] [phone]([place]    Queena Monrreal C 06/10/2022, 10:27 AM

## 2022-06-10 NOTE — Evaluation (Signed)
Occupational Therapy Evaluation Patient Details Name: Morgan Sandoval MRN: [id number] DOB: June 02, 1935 Today's Date: 06/10/2022   History of Present Illness Morgan Sandoval is an 46yoF who comes to [facility] on 8/26 from [facility] with a recent fall, LUE pain, N/V. Pt reports she was waklign down the hall and passed out.  PMH: RA, HTN, COPD, bronchitis, spinal stenosis, tobacco abuse, osteoporosis, GERD. Pt admitted in August (per pt) for PNA, PF followed by outpatient pulmonology. Scan report includes "Proximal left humeral fracture involving the surgical neck as well as the greater tuberosity." Orthopedic consult recommendling conservative management with sling and FU in 7-10 days.   Clinical Impression   Morgan Sandoval was seen for OT evaluation this date. Prior to hospital admission, pt was Independent for mobility and ADLs. Pt lives alone at [place] - reports her daughter can come stay with her and provide 24/7 care. Pt presents to acute OT demonstrating impaired ADL performance and functional mobility 2/2 decreased activity tolerance and functional impaired use of nondominant LUE.   Pt currently requires  SBA don underwear sit<>stand. Tolerates ~150 ft functional mobility, cues for standing rest breaks. SpO2 86% on RA, 95% on 2L [place] at rest, 82% on 2L with mobility, improved to 88% on 4L [place]. Pt would benefit from skilled OT to address noted impairments and functional limitations (see below for any additional details). Upon hospital discharge, recommend [place] with 24/7 SUPERVISION - if unable to have supervision pt will require STR stay.    Recommendations for follow up therapy are one component of a multi-disciplinary discharge planning process, led by the attending physician.  Recommendations may be updated based on patient status, additional functional criteria and insurance authorization.   Follow Up Recommendations  Home health OT    Assistance Recommended at Discharge Frequent  or constant Supervision/Assistance  Patient can return home with the following A little help with walking and/or transfers;A little help with bathing/dressing/bathroom;Help with stairs or ramp for entrance    Functional Status Assessment  Patient has had a recent decline in their functional status and demonstrates the ability to make significant improvements in function in a reasonable and predictable amount of time.  Equipment Recommendations  BSC/3in1    Recommendations for Other Services       Precautions / Restrictions Precautions Precautions: Fall Required Braces or Orthoses: Sling Restrictions Weight Bearing Restrictions: Yes LUE Weight Bearing: Non weight bearing      Mobility Bed Mobility Overal bed mobility: Modified Independent                  Transfers Overall transfer level: Needs assistance Equipment used: None Transfers: Sit to/from Stand Sit to Stand: Supervision           General transfer comment: no LOBs or assist needed      Balance Overall balance assessment: Needs assistance Sitting-balance support: No upper extremity supported, Feet supported Sitting balance-Leahy Scale: Normal     Standing balance support: No upper extremity supported, During functional activity Standing balance-Leahy Scale: Good                             ADL either performed or assessed with clinical judgement   ADL Overall ADL's : Needs assistance/impaired  General ADL Comments: SBA don underwear sit<>stand. Tolerates ~150 ft functional mobilit, standing rest break for desat.      Pertinent Vitals/Pain Pain Assessment Pain Assessment: Faces Faces Pain Scale: Hurts little more Pain Location: Left shoulder Pain Descriptors / Indicators: Aching Pain Intervention(s): Limited activity within patient's tolerance, Repositioned     Hand Dominance Right   Extremity/Trunk Assessment Upper  Extremity Assessment Upper Extremity Assessment: LUE deficits/detail LUE: Unable to fully assess due to immobilization;Unable to fully assess due to pain   Lower Extremity Assessment Lower Extremity Assessment: Overall WFL for tasks assessed       Communication Communication Communication: HOH   Cognition Arousal/Alertness: Awake/alert Behavior During Therapy: WFL for tasks assessed/performed Overall Cognitive Status: Within Functional Limits for tasks assessed                                 General Comments: cues for insight into O2 needs     General Comments  SpO2 86% on RA, 95% on 2L [place] at rest, 82% on 2L with mobility    Exercises     Shoulder Instructions      Home Living Family/patient expects to be discharged to:: Private residence Living Arrangements: Alone Available Help at Discharge: Available 24 hours/day Type of Home: Independent living facility Home Access: Level entry     Home Layout: One level     Bathroom Shower/Tub: Chief Strategy Officer: Building services engineer Comments: daughter lives in [place], can work from home and stay with pt 24/7      Prior Functioning/Environment Prior Level of Function : Independent/Modified Independent             Mobility Comments: typically independent, does nto walk a lot outside due to the Summer heat ADLs Comments: typically independent        OT Problem List: Decreased range of motion;Decreased activity tolerance;Impaired balance (sitting and/or standing);Decreased safety awareness;Impaired UE functional use      OT Treatment/Interventions: Self-care/ADL training;Therapeutic exercise;DME and/or AE instruction;Energy conservation;Therapeutic activities;Patient/family education;Balance training    OT Goals(Current goals can be found in the care plan section) Acute Rehab OT Goals Patient Stated Goal: to go home OT Goal Formulation: With patient Time For Goal  Achievement: 06/24/22 Potential to Achieve Goals: Good ADL Goals Pt Will Perform Upper Body Dressing: with min assist;sitting Pt Will Perform Lower Body Dressing: with modified independence;sit to/from stand Pt Will Transfer to Toilet: with modified independence;regular height toilet;ambulating  OT Frequency: Min 2X/week    Co-evaluation              AM-PAC OT "6 Clicks" Daily Activity     Outcome Measure Help from another person eating meals?: None Help from another person taking care of personal grooming?: A Little Help from another person toileting, which includes using toliet, bedpan, or urinal?: A Little Help from another person bathing (including washing, rinsing, drying)?: A Little Help from another person to put on and taking off regular upper body clothing?: A Lot Help from another person to put on and taking off regular lower body clothing?: A Little 6 Click Score: 18   End of Session Nurse Communication: Mobility status  Activity Tolerance: Patient tolerated treatment well Patient left: in bed;with call bell/phone within reach  OT Visit Diagnosis: Other abnormalities of gait and mobility (R26.89);Muscle weakness (generalized) (M62.81)  Time: 3014-1597 OT Time Calculation (min): 30 min Charges:  OT General Charges $OT Visit: 1 Visit OT Evaluation $OT Eval Moderate Complexity: 1 Mod OT Treatments $Self Care/Home Management : 23-37 mins  Dessie Coma, M.S. OTR/L  06/10/22, 1:41 PM  ascom [phone]

## 2022-06-10 NOTE — Progress Notes (Addendum)
Morgan Sandoval at [place] NAME: Morgan Sandoval    MR#:  [id number]  DATE OF BIRTH:  08-Sep-1935  SUBJECTIVE:   Patient remains in the ER due to bed shortage. She is overall doing well. She is asking me when she can be discharged. She remains intermittent sinus brady. Denies any dizziness. Worked with physical therapy yesterday. Complains of left shoulder pain.   VITALS:  Blood pressure 118/60, pulse 61, temperature 98.6 F (37 C), temperature source Oral, resp. rate 12, height '5\' 3"'$  (1.6 m), weight 48.1 kg, SpO2 98 %.  PHYSICAL EXAMINATION:   GENERAL:  87 y.o.-year-old patient lying in the bed with no acute distress.  LUNGS: Normal breath sounds bilaterally, no wheezing, rales, rhonchi.  CARDIOVASCULAR: S1, S2 normal. No murmurs, rubs, or gallops.  ABDOMEN: Soft, nontender, nondistended. Bowel sounds present.  EXTREMITIES: No  edema b/l. Left shoulder sling+ NEUROLOGIC: nonfocal  patient is alert and awake SKIN: No obvious rash, lesion, or ulcer.   LABORATORY PANEL:  CBC Recent Labs  Lab 06/08/22 2037  WBC 8.4  HGB 13.9  HCT 42.9  PLT 217    Chemistries  Recent Labs  Lab 06/08/22 2037  NA 138  K 4.0  CL 103  CO2 27  GLUCOSE 119*  BUN 21  CREATININE 1.15*  CALCIUM 8.8*   Cardiac Enzymes No results for input(s): "TROPONINI" in the last 168 hours. RADIOLOGY:  DG Chest Port 1 View  Result Date: 06/09/2022 CLINICAL DATA:  Syncope EXAM: PORTABLE CHEST 1 VIEW COMPARISON:  10/24/2021.  Left humerus 06/08/2022 FINDINGS: Extensive chronic lung disease/fibrosis throughout the lungs bilaterally, left greater than right. No acute confluent airspace opacities or effusions. Heart is normal size. Left humeral neck fracture noted as seen on humerus series yesterday. IMPRESSION: Extensive chronic lung disease/fibrosis. No active disease. Electronically Signed   By: Rolm Baptise M.D.   On: 06/09/2022 01:58   CT Head Wo Contrast  Result  Date: 06/09/2022 CLINICAL DATA:  Mental status changes, unknown cause. EXAM: CT HEAD WITHOUT CONTRAST TECHNIQUE: Contiguous axial images were obtained from the base of the skull through the vertex without intravenous contrast. RADIATION DOSE REDUCTION: This exam was performed according to the departmental dose-optimization program which includes automated exposure control, adjustment of the mA and/or kV according to patient size and/or use of iterative reconstruction technique. COMPARISON:  MRI brain report 09/17/2017. Images unavailable in PACS at time of reading. FINDINGS: Brain: There is mild global atrophy and moderately developed small-vessel disease of the cerebral white matter. There is a chronic right thalamic lacunar infarct. No acute cortical based infarct, hemorrhage, mass or mass effect, or midline shift are seen. There is trace mineralization in the posterior basal ganglia. Vascular: The carotid siphons are moderately calcified. No hyperdense central vessel is seen. Skull: Negative for fracture or focal lesion. Sinuses/Orbits: There is mild membrane disease in the maxillary and ethmoid sinuses without fluid level. Other sinuses are clear. Mild S shaped nasal septum. Old lens extractions. Other: Minimal fluid in both mastoid tips. Rest of the mastoids are clear. Both middle ears are clear. IMPRESSION: 1. No acute intracranial CT findings.  Chronic change. 2. Sinus membrane disease. 3. Fluid in the mastoid tips. 4. Carotid atherosclerosis. Electronically Signed   By: Telford Nab M.D.   On: 06/09/2022 00:51   DG Humerus Left  Result Date: 06/08/2022 CLINICAL DATA:  Recent fall with shoulder pain, initial encounter EXAM: LEFT HUMERUS - 2+ VIEW COMPARISON:  None Available.  FINDINGS: There is a fracture involving the proximal left humerus at the level of the surgical neck. Focal avulsion involving the greater tuberosity is noted as well. Distal humerus is unremarkable. Remainder of the shoulder girdle  appears within normal limits. IMPRESSION: Proximal left humeral fracture involving the surgical neck as well as the greater tuberosity. Electronically Signed   By: Inez Catalina M.D.   On: 06/08/2022 21:13    Assessment and [place] is a 87 y.o. Caucasian female with medical history significant for hypertension, GERD, and Raynaud's disease who presented to the emergency room with acute onset of syncope when she was walking back to her apartment at [facility] after visiting a friend.  She was having left upper arm pain and had vomiting for which she received 4 mg of IM Zofran by EMS en route to the ER  Syncope with fall elevated troponin appears demand ischemia with fall and fracture -- patient came in after she had an accident fall and had episode of vomiting. -- Etiology unclear. -- Heart rate 49 --60. Discontinued beta-blocker. No high-grade AV block or pauses noted on telemetry. -- seen by cardiology Dr. Clayborn Bigness -- DC heparin drip. Patient does not have history of coronary artery disease. -- Continue baby aspirin. -- Increased lisinopril for better blood pressure control to 20 mg daily -- outpatient 7 day Holter monitor at discharge -- echo can be done as outpatient per cardiology  Nondisplaced left humeral head fracture -- seen by Dr. Nadara Mustard Miller-rec-man's pain control and continue sliding. Sling can be removed for bathing. -- Follow palpation 7 to 10 days  Hypertension -- increase lisinopril 20 mg daily -- DC ed beta-blockers   H/o RA follows with dr Maxtyn Nuzum at [facility] --on plaquenil and cellcept  Procedures:none Family communication :dter Morgan Sandoval Consults :Cardiology-[facility], ortho dr Sabra Heck CODE STATUS: Full DVT Prophylaxis :lovenox Level of care: Telemetry Medical Status is: Inpatient Remains inpatient appropriate because: patient is medically stable  awaiting discharge planning to [facility] rehab once bed up available. TOC has been informed.    TOTAL TIME TAKING  CARE OF THIS PATIENT: 35 minutes.  >50% time spent on counselling and coordination of care  Note: This dictation was prepared with Dragon dictation along with smaller phrase technology. Any transcriptional errors that result from this process are unintentional.  Fritzi Mandes M.D    [facility]   CC: Primary care physician; Dion Body, MD

## 2022-06-11 DIAGNOSIS — S42215A Unspecified nondisplaced fracture of surgical neck of left humerus, initial encounter for closed fracture: Secondary | ICD-10-CM | POA: Diagnosis not present

## 2022-06-11 DIAGNOSIS — S51019A Laceration without foreign body of unspecified elbow, initial encounter: Secondary | ICD-10-CM | POA: Diagnosis not present

## 2022-06-11 DIAGNOSIS — R55 Syncope and collapse: Secondary | ICD-10-CM | POA: Diagnosis not present

## 2022-06-11 DIAGNOSIS — J449 Chronic obstructive pulmonary disease, unspecified: Secondary | ICD-10-CM | POA: Diagnosis not present

## 2022-06-11 MED ORDER — CIPROFLOXACIN HCL 0.3 % OP SOLN
1.0000 [drp] | Freq: Three times a day (TID) | OPHTHALMIC | Status: DC
  Administered 2022-06-11 – 2022-06-12 (×2): 1 [drp] via OPHTHALMIC
  Filled 2022-06-11: qty 2.5

## 2022-06-11 MED ORDER — MORPHINE SULFATE (PF) 2 MG/ML IV SOLN: 1.0000 mg | INTRAVENOUS | Status: DC | PRN

## 2022-06-11 NOTE — Progress Notes (Addendum)
Physical Therapy Treatment Patient Details Name: Morgan Sandoval MRN: [id number] DOB: 03/23/1935 Today's Date: 06/11/2022   History of Present Illness Morgan Sandoval is an 53yoF who comes to [facility] on 8/26 from [facility] with a recent fall, LUE pain, N/V. Pt reports she was waklign down the hall and passed out.  PMH: RA, HTN, COPD, bronchitis, spinal stenosis, tobacco abuse, osteoporosis, GERD. Pt admitted in August (per pt) for PNA, PF followed by outpatient pulmonology. Scan report includes "Proximal left humeral fracture involving the surgical neck as well as the greater tuberosity." Orthopedic consult recommendling conservative management with sling and FU in 7-10 days.    PT Comments    Pt was sitting EOB upon arriving. She requested to have BM prior to ambulation in hallway. She did have successful BM. On 2 L o2 throughout most of session with sao2>92%. Removed O2 during ambulation. Poor pleth reading however sao2 reading in mid 80s. Author questions if pt had true desaturation. Will need to continue to monitor. Overall pt demonstrates much improved abilities from last PT session. Unfortunately pt lives alone and will not have any assistance available at DC. Due to pt's high likelihood of falling and new need for O2. Recommend DC to skilled side of her retirement community. SNF is most appropriate at DC to address deficits while maximizing independence with ADLs prior to returning home alone.   Recommendations for follow up therapy are one component of a multi-disciplinary discharge planning process, led by the attending physician.  Recommendations may be updated based on patient status, additional functional criteria and insurance authorization.  Follow Up Recommendations  Skilled nursing-short term rehab (<3 hours/day) (pt lives alone and will not be able to have 24/7 assistance at home. recommend SNF to improve balance and safety with ADLs.)     Assistance Recommended at  Discharge Frequent or constant Supervision/Assistance  Patient can return home with the following A little help with walking and/or transfers   Equipment Recommendations  None recommended by PT       Precautions / Restrictions Precautions Precautions: Fall Required Braces or Orthoses: Sling Restrictions Weight Bearing Restrictions: Yes LUE Weight Bearing: Non weight bearing     Mobility  Bed Mobility    General bed mobility comments: pt was independently sitting EOB upon arriving. supervision required to reposition back into bed after OOB activity    Transfers Overall transfer level: Needs assistance Equipment used: Straight cane Transfers: Sit to/from Stand Sit to Stand: Supervision     Ambulation/Gait Ambulation/Gait assistance: Min guard Gait Distance (Feet): 120 Feet Assistive device: Straight cane Gait Pattern/deviations: Step-through pattern Gait velocity: decreased     General Gait Details: pt demonmstrated improved overall balance however still has some unsteadiness during gait with SPC. pt did report having several falls in past    Balance Overall balance assessment: Needs assistance Sitting-balance support: No upper extremity supported, Feet supported Sitting balance-Leahy Scale: Normal     Standing balance support: No upper extremity supported, During functional activity Standing balance-Leahy Scale: Good      Cognition Arousal/Alertness: Awake/alert Behavior During Therapy: WFL for tasks assessed/performed Overall Cognitive Status: Within Functional Limits for tasks assessed      General Comments: pt on 2 L o2 throughout session. sao2 >92% with O2. Removed o2 while ambulating short distance with poor pleth reading stating 84% however once reading accuracy improved, sao2 >88% quickly. will coninue to closely monitor               Pertinent  Vitals/Pain Pain Assessment Pain Assessment: 0-10 Pain Score: 0-No pain Pain Intervention(s): Limited  activity within patient's tolerance, Monitored during session, Repositioned     PT Goals (current goals can now be found in the care plan section) Acute Rehab PT Goals Patient Stated Goal: get better and home Progress towards PT goals: Progressing toward goals    Frequency    7X/week      PT Plan Current plan remains appropriate       AM-PAC PT "6 Clicks" Mobility   Outcome Measure  Help needed turning from your back to your side while in a flat bed without using bedrails?: None Help needed moving from lying on your back to sitting on the side of a flat bed without using bedrails?: None Help needed moving to and from a bed to a chair (including a wheelchair)?: A Little Help needed standing up from a chair using your arms (e.g., wheelchair or bedside chair)?: A Little Help needed to walk in hospital room?: A Lot Help needed climbing 3-5 steps with a railing? : A Lot 6 Click Score: 18    End of Session   Activity Tolerance: Patient tolerated treatment well;Patient limited by fatigue;No increased pain Patient left: in bed;with call bell/phone within reach Nurse Communication: Mobility status PT Visit Diagnosis: Unsteadiness on feet (R26.81);Difficulty in walking, not elsewhere classified (R26.2)     Time: 6389-3734 PT Time Calculation (min) (ACUTE ONLY): 26 min  Charges:  $Gait Training: 8-22 mins $Therapeutic Activity: 8-22 mins                    Julaine Fusi PTA 06/11/22, 1:08 PM

## 2022-06-11 NOTE — NC FL2 (Signed)
[place] LEVEL OF CARE SCREENING TOOL     IDENTIFICATION  Patient Name: Morgan Sandoval Birthdate: April 29, 1935 Sex: female Admission Date (Current Location): 06/09/2022  [facility] and [place] Number:  Engineering geologist and Address:  [facility], [address], [place], [place] [address]      Provider Number: [id number]  Attending Physician Name and Address:  Fritzi Mandes, MD  Relative Name and Phone Number:  Benson Norway - daughter- [phone]    Current Level of Care: Hospital Recommended Level of Care: [place] Prior Approval Number:    Date Approved/Denied:   PASRR Number: [id number]  Discharge Plan: SNF    Current Diagnoses: Patient Active Problem List   Diagnosis Date Noted   Skin tear of elbow without complication, initial encounter    NSTEMI (non-ST elevated myocardial infarction) ([place]) 06/09/2022   Syncope, cardiogenic 06/09/2022   Closed nondisplaced fracture of surgical neck of left humerus    Syncope    Closed fracture of left proximal humerus    Raynaud's disease 06/02/2022   Elevated troponin 10/25/2021   Hypomagnesemia 10/25/2021   GERD (gastroesophageal reflux disease) 10/25/2021   Essential hypertension 10/25/2021   Hypokalemia 10/24/2021   Hyponatremia 10/24/2021   Weakness 10/24/2021   AKI (acute kidney injury) ([place]) 10/24/2021   Sepsis without acute organ dysfunction (HCC)    PNA (pneumonia) 05/27/2021   History of tobacco use 05/27/2021   COPD with chronic bronchitis ([place]) 05/27/2021   Emphysema lung ([place]) 05/27/2021   Leukocytosis 05/27/2021   Spinal stenosis of lumbar region 11/30/2020   Lumbar pain 10/26/2020   Diarrhea of presumed infectious origin 04/29/2019   Heme positive stool 04/29/2019    Orientation RESPIRATION BLADDER Height & Weight     Self, Time, Situation, Place  Normal Continent Weight: 48.1 kg Height:  '5\' 3"'$  (160 cm)  BEHAVIORAL SYMPTOMS/MOOD NEUROLOGICAL  BOWEL NUTRITION STATUS      Continent Diet (Heart Healthy)  AMBULATORY STATUS COMMUNICATION OF NEEDS Skin   Limited Assist Verbally Normal                       Personal Care Assistance Level of Assistance  Bathing, Feeding, Dressing Bathing Assistance: Limited assistance Feeding assistance: Limited assistance Dressing Assistance: Limited assistance     Functional Limitations Info             SPECIAL CARE FACTORS FREQUENCY  PT (By licensed PT), OT (By licensed OT)     PT Frequency: 5 timess per week OT Frequency: 2-3 times per week            Contractures Contractures Info: Not present    Additional Factors Info  Code Status, Allergies Code Status Info: Full Allergies Info: Hydrochlorothiazide, Nifedipine, sulfa antibiotics           Current Medications (06/11/2022):  This is the current hospital active medication list Current Facility-Administered Medications  Medication Dose Route Frequency Provider Last Rate Last Admin   acetaminophen (TYLENOL) tablet 650 mg  650 mg Oral Q4H PRN Mansy, Jan A, MD       albuterol (PROVENTIL) (2.5 MG/3ML) 0.083% nebulizer solution 2.5 mg  2.5 mg Nebulization Q6H PRN Renda Rolls, RPH       ALPRAZolam Duanne Moron) tablet 0.25 mg  0.25 mg Oral BID PRN Mansy, Jan A, MD   0.25 mg at 06/10/22 5400   aspirin EC tablet 81 mg  81 mg Oral Daily Mansy, Arvella Merles, MD  81 mg at 06/11/22 0832   atorvastatin (LIPITOR) tablet 80 mg  80 mg Oral Daily Mansy, Jan A, MD   80 mg at 06/11/22 3220   cholecalciferol (VITAMIN D3) 25 MCG (1000 UNIT) tablet 1,000 Units  1,000 Units Oral Daily Mansy, Jan A, MD   1,000 Units at 06/11/22 2542   cyanocobalamin (VITAMIN B12) tablet 2,000 mcg  2,000 mcg Oral Daily Mansy, Jan A, MD   2,000 mcg at 06/11/22 0854   enoxaparin (LOVENOX) injection 30 mg  30 mg Subcutaneous Q24H Fritzi Mandes, MD   30 mg at 06/10/22 2213   HYDROcodone-acetaminophen (NORCO/VICODIN) 5-325 MG per tablet 1-2 tablet  1-2 tablet Oral Q6H PRN  Fritzi Mandes, MD   2 tablet at 06/10/22 0933   hydroxychloroquine (PLAQUENIL) tablet 200 mg  200 mg Oral Daily Mansy, Jan A, MD   200 mg at 06/11/22 0854   lisinopril (ZESTRIL) tablet 20 mg  20 mg Oral Daily Tristan Schroeder, PA-C   20 mg at 06/11/22 7062   magnesium hydroxide (MILK OF MAGNESIA) suspension 30 mL  30 mL Oral Daily PRN Mansy, Jan A, MD   30 mL at 06/11/22 0839   morphine (PF) 2 MG/ML injection 1 mg  1 mg Intravenous Q4H PRN Mansy, Jan A, MD       mycophenolate (CELLCEPT) capsule 500 mg  500 mg Oral BID AC Mansy, Jan A, MD   500 mg at 06/11/22 3762   nitroGLYCERIN (NITROSTAT) SL tablet 0.4 mg  0.4 mg Sublingual Q5 Min x 3 PRN Mansy, Jan A, MD       ondansetron [facility]) injection 4 mg  4 mg Intravenous Q6H PRN Mansy, Jan A, MD   4 mg at 06/09/22 0906   pantoprazole (PROTONIX) EC tablet 40 mg  40 mg Oral Daily Mansy, Jan A, MD   40 mg at 06/11/22 8315   traZODone (DESYREL) tablet 25 mg  25 mg Oral QHS PRN Mansy, Arvella Merles, MD       Current Outpatient Medications  Medication Sig Dispense Refill   alendronate (FOSAMAX) 70 MG tablet Take 70 mg by mouth every Sunday.     bisoprolol (ZEBETA) 5 MG tablet Take 1 tablet (5 mg total) by mouth daily. 90 tablet 0   cholecalciferol (VITAMIN D3) 25 MCG (1000 UNIT) tablet Take 1,000 Units by mouth daily.     Cyanocobalamin (B-12) 5000 MCG CAPS Take 5,000 mcg by mouth daily.     diclofenac sodium (VOLTAREN) 1 % GEL Apply 2 g topically 4 (four) times daily.     hydroxychloroquine (PLAQUENIL) 200 MG tablet Take 200 mg by mouth daily.     lisinopril (ZESTRIL) 5 MG tablet Take 2 tablets by mouth daily.     Multiple Vitamins-Minerals (PRESERVISION AREDS 2+MULTI VIT) CAPS Take 1 tablet by mouth daily.     mycophenolate (CELLCEPT) 500 MG tablet Take 500 mg by mouth 2 (two) times daily. Before meals     pantoprazole (PROTONIX) 40 MG tablet Take 40 mg by mouth daily.     albuterol (VENTOLIN HFA) 108 (90 Base) MCG/ACT inhaler Inhale 2 puffs into the lungs  every 6 (six) hours as needed for wheezing.     ipratropium (ATROVENT) 0.06 % nasal spray Place into both nostrils. (Patient not taking: Reported on 06/09/2022)     NITRO-BID 2 % ointment SMARTSIG:T-DERMAL (Patient not taking: Reported on 06/09/2022)       Discharge Medications: Please see discharge summary for a list of discharge medications.  Relevant Imaging Results:  Relevant Lab Results:   Additional Information SS# [id number]  Shelbie Hutching, RN

## 2022-06-11 NOTE — TOC Progression Note (Signed)
Transition of Care [facility]) - Progression Note    Patient Details  Name: Morgan Sandoval MRN: [id number] Date of Birth: [birth year]  Transition of [facility]) CM/SW Contact  Shelbie Hutching, RN Phone Number: 06/11/2022, 9:47 AM  Clinical Narrative:    Insurance authorization started.   Expected Discharge Plan: Skilled Nursing Facility Barriers to Discharge: SNF Pending bed offer  Expected Discharge Plan and Services Expected Discharge Plan: [place]   Discharge Planning Services: CM Consult Post Acute Care Choice: [place] Living arrangements for the past 2 months: [place]                 DME Arranged: N/A DME Agency: NA       HH Arranged: NA HH Agency: NA         Social Determinants of Health (SDOH) Interventions    Readmission Risk Interventions     No data to display

## 2022-06-11 NOTE — Progress Notes (Signed)
[place] [facility] [place] NAME: Morgan Sandoval    MR#:  [id number]  DATE OF BIRTH:  01-11-1935  SUBJECTIVE:   Patient remains in the ER due to bed shortage. She is overall doing well. She is asking me when she can be discharged. She remains intermittent sinus brady. Denies any dizziness. Worked with physical therapy yesterday. Complains of left shoulder pain. Eyes congested and crusty   VITALS:  Blood pressure 98/68, pulse 70, temperature 98.4 F (36.9 C), temperature source Oral, resp. rate 17, height '5\' 3"'$  (1.6 m), weight 48.1 kg, SpO2 98 %.  PHYSICAL EXAMINATION:   GENERAL:  87 y.o.-year-old patient lying in the bed with no acute distress. Crusty eye lids LUNGS: Normal breath sounds bilaterally, no wheezing, rales, rhonchi.  CARDIOVASCULAR: S1, S2 normal. No murmurs, rubs, or gallops.  ABDOMEN: Soft, nontender, nondistended. Bowel sounds present.  EXTREMITIES: No  edema b/l. Left shoulder sling+ NEUROLOGIC: nonfocal  patient is alert and awake SKIN: No obvious rash, lesion, or ulcer.   LABORATORY PANEL:  CBC Recent Labs  Lab 06/08/22 2037  WBC 8.4  HGB 13.9  HCT 42.9  PLT 217     Chemistries  Recent Labs  Lab 06/08/22 2037  NA 138  K 4.0  CL 103  CO2 27  GLUCOSE 119*  BUN 21  CREATININE 1.15*  CALCIUM 8.8*    Cardiac Enzymes No results for input(s): "TROPONINI" in the last 168 hours. RADIOLOGY:  No results found.  Assessment and Plan  Morgan Sandoval is a 87 y.o. Caucasian female with medical history significant for hypertension, GERD, and Raynaud's disease who presented to the emergency room with acute onset of syncope when she was walking back to her apartment at [facility] after visiting a friend.  She was having left upper arm pain and had vomiting for which she received 4 mg of IM Zofran by EMS en route to the ER  Syncope with fall elevated troponin appears demand ischemia with fall and fracture -- patient  came in after she had an accident fall and had episode of vomiting. -- Etiology unclear. -- Heart rate 49 --60. Discontinued beta-blocker. No high-grade AV block or pauses noted on telemetry. -- seen by cardiology Dr. Clayborn Bigness -- DC heparin drip. Patient does not have history of coronary artery disease. -- Continue baby aspirin. -- Increased lisinopril for better blood pressure control to 20 mg daily -- outpatient 7 day Holter monitor at discharge -- echo can be done as outpatient per cardiology  Nondisplaced left humeral head fracture -- seen by Dr. Nadara Mustard Miller-rec-man's pain control and continue sliding. Sling can be removed for bathing. -- Follow palpation 7 to 10 days  Hypertension -- increase lisinopril 20 mg daily -- DC ed beta-blockers   H/o RA follows with dr Deyci Gesell at [facility] --on plaquenil and cellcept  Conjunctival congestion --cipro eye drops x 3-4 days  Procedures:none Family communication :dter [name] Consults :Cardiology-[facility], ortho dr Sabra Heck CODE STATUS: Full DVT Prophylaxis :lovenox Level of care: Telemetry Medical Status is: Inpatient Remains inpatient appropriate because: patient is medically stable  awaiting discharge planning to [facility] rehab once bed up available. TOC has been informed.    TOTAL TIME TAKING CARE OF THIS PATIENT: 35 minutes.  >50% time spent on counselling and coordination of care  Note: This dictation was prepared with Dragon dictation along with smaller phrase technology. Any transcriptional errors that result from this process are unintentional.  [place]ville Haggard.D  [facility]   CC: Primary care physician; Dion Body, MD

## 2022-06-11 NOTE — Progress Notes (Signed)
SATURATION QUALIFICATIONS: (This note is used to comply with regulatory documentation for home oxygen)  Patient Saturations on Room Air at Rest = 87%  Patient Saturations on Room Air while Ambulating = n/a%  Patient Saturations on n/a Liters of oxygen while Ambulating = n/a%  Please briefly explain why patient needs home oxygen:  Patient desats to 87% on room air at rest.

## 2022-06-11 NOTE — TOC Initial Note (Signed)
Transition of Care [facility]) - Initial/Assessment Note    Patient Details  Name: Morgan Sandoval MRN: [id number] Date of Birth: 1935-01-14  Transition [facility]) CM/SW Contact:    Shelbie Hutching, RN Phone Number: 06/11/2022, 9:01 AM  Clinical Narrative:                 Patient is from [facility] independent living, patient admitted to the hospital for NSTEMI.  Patient's daughter lives in [place] and will not be able to come and stay with her mom at discharge.  PT is recommending SNF.  RNCM will reach out to [facility] and then start insurance authorization.  Plan for DC today.   Expected Discharge Plan: Skilled Nursing Facility Barriers to Discharge: SNF Pending bed offer   Patient Goals and CMS Choice Patient states their goals for this hospitalization and ongoing recovery are:: Plan for discharge to SNF CMS Medicare.gov Compare Post Acute Care list provided to:: Patient Choice offered to / list presented to : Patient  Expected Discharge Plan and Services Expected Discharge Plan: [place]   Discharge Planning Services: CM Consult Post Acute Care Choice: [place] Living arrangements for the past 2 months: [place]                 DME Arranged: N/A DME Agency: NA       HH Arranged: NA [place] Agency: NA        Prior Living Arrangements/Services Living arrangements for the past 2 months: [place] Lives with:: Self Patient language and need for interpreter reviewed:: Yes Do you feel safe going back to the place where you live?: Yes      Need for Family Participation in Patient Care: Yes (Comment) Care giver support system in place?: Yes (comment)   Criminal Activity/Legal Involvement Pertinent to Current Situation/Hospitalization: No - Comment as needed  Activities of Daily Living   ADL Screening (condition at time of admission) Patient's cognitive ability adequate to safely complete daily  activities?: Yes Is the patient deaf or have difficulty hearing?: No Does the patient have difficulty seeing, even when wearing glasses/contacts?: No Does the patient have difficulty concentrating, remembering, or making decisions?: No Patient able to express need for assistance with ADLs?: Yes Does the patient have difficulty dressing or bathing?: No Independently performs ADLs?: Yes (appropriate for developmental age) Does the patient have difficulty walking or climbing stairs?: No Weakness of Legs: None Weakness of Arms/Hands: None  Permission Sought/Granted Permission sought to share information with : Case Manager, Customer service manager, Family Supports Permission granted to share information with : Yes, Verbal Permission Granted  Share Information with NAME: [employer]  Permission granted to share info w AGENCY: [place] granted to share info w Relationship: daughter  Permission granted to share info w Contact Information: [phone]  Emotional Assessment         Alcohol / Substance Use: Not Applicable Psych Involvement: No (comment)  Admission diagnosis:  NSTEMI (non-ST elevated myocardial infarction) ([place]) [I21.4] Syncope [R55] Patient Active Problem List   Diagnosis Date Noted   Skin tear of elbow without complication, initial encounter    NSTEMI (non-ST elevated myocardial infarction) ([place]) 06/09/2022   Syncope, cardiogenic 06/09/2022   Closed nondisplaced fracture of surgical neck of left humerus    Syncope    Closed fracture of left proximal humerus    Raynaud's disease 06/02/2022   Elevated troponin 10/25/2021   Hypomagnesemia 10/25/2021   GERD (  gastroesophageal reflux disease) 10/25/2021   Essential hypertension 10/25/2021   Hypokalemia 10/24/2021   Hyponatremia 10/24/2021   Weakness 10/24/2021   AKI (acute kidney injury) ([place]) 10/24/2021   Sepsis without acute organ dysfunction (HCC)    PNA (pneumonia) 05/27/2021   History  of tobacco use 05/27/2021   COPD with chronic bronchitis ([place]) 05/27/2021   Emphysema lung ([place]) 05/27/2021   Leukocytosis 05/27/2021   Spinal stenosis of lumbar region 11/30/2020   Lumbar pain 10/26/2020   Diarrhea of presumed infectious origin 04/29/2019   Heme positive stool 04/29/2019   PCP:  Dion Body, MD Pharmacy:   [facility] [place] [address] [place] [address] Phone: [phone] Fax: [phone]     Social Determinants of Health (SDOH) Interventions    Readmission Risk Interventions     No data to display

## 2022-06-12 DIAGNOSIS — J8410 Pulmonary fibrosis, unspecified: Secondary | ICD-10-CM | POA: Diagnosis not present

## 2022-06-12 DIAGNOSIS — K219 Gastro-esophageal reflux disease without esophagitis: Secondary | ICD-10-CM | POA: Diagnosis not present

## 2022-06-12 DIAGNOSIS — T148XXA Other injury of unspecified body region, initial encounter: Secondary | ICD-10-CM | POA: Diagnosis not present

## 2022-06-12 DIAGNOSIS — Z23 Encounter for immunization: Secondary | ICD-10-CM | POA: Diagnosis not present

## 2022-06-12 DIAGNOSIS — S42225A 2-part nondisplaced fracture of surgical neck of left humerus, initial encounter for closed fracture: Secondary | ICD-10-CM | POA: Diagnosis not present

## 2022-06-12 DIAGNOSIS — I214 Non-ST elevation (NSTEMI) myocardial infarction: Secondary | ICD-10-CM | POA: Diagnosis not present

## 2022-06-12 DIAGNOSIS — R262 Difficulty in walking, not elsewhere classified: Secondary | ICD-10-CM | POA: Diagnosis not present

## 2022-06-12 DIAGNOSIS — M7989 Other specified soft tissue disorders: Secondary | ICD-10-CM | POA: Diagnosis not present

## 2022-06-12 DIAGNOSIS — M48061 Spinal stenosis, lumbar region without neurogenic claudication: Secondary | ICD-10-CM | POA: Diagnosis not present

## 2022-06-12 DIAGNOSIS — Z9181 History of falling: Secondary | ICD-10-CM | POA: Diagnosis not present

## 2022-06-12 DIAGNOSIS — J42 Unspecified chronic bronchitis: Secondary | ICD-10-CM | POA: Diagnosis not present

## 2022-06-12 DIAGNOSIS — H1033 Unspecified acute conjunctivitis, bilateral: Secondary | ICD-10-CM | POA: Diagnosis not present

## 2022-06-12 DIAGNOSIS — M810 Age-related osteoporosis without current pathological fracture: Secondary | ICD-10-CM | POA: Diagnosis not present

## 2022-06-12 DIAGNOSIS — M069 Rheumatoid arthritis, unspecified: Secondary | ICD-10-CM | POA: Diagnosis not present

## 2022-06-12 DIAGNOSIS — I129 Hypertensive chronic kidney disease with stage 1 through stage 4 chronic kidney disease, or unspecified chronic kidney disease: Secondary | ICD-10-CM | POA: Diagnosis not present

## 2022-06-12 DIAGNOSIS — W19XXXA Unspecified fall, initial encounter: Secondary | ICD-10-CM | POA: Diagnosis not present

## 2022-06-12 DIAGNOSIS — J439 Emphysema, unspecified: Secondary | ICD-10-CM | POA: Diagnosis not present

## 2022-06-12 DIAGNOSIS — S42215K Unspecified nondisplaced fracture of surgical neck of left humerus, subsequent encounter for fracture with nonunion: Secondary | ICD-10-CM | POA: Diagnosis not present

## 2022-06-12 DIAGNOSIS — S42215D Unspecified nondisplaced fracture of surgical neck of left humerus, subsequent encounter for fracture with routine healing: Secondary | ICD-10-CM | POA: Diagnosis not present

## 2022-06-12 DIAGNOSIS — I7300 Raynaud's syndrome without gangrene: Secondary | ICD-10-CM | POA: Diagnosis not present

## 2022-06-12 DIAGNOSIS — N1831 Chronic kidney disease, stage 3a: Secondary | ICD-10-CM | POA: Diagnosis not present

## 2022-06-12 DIAGNOSIS — I10 Essential (primary) hypertension: Secondary | ICD-10-CM | POA: Diagnosis not present

## 2022-06-12 DIAGNOSIS — M25522 Pain in left elbow: Secondary | ICD-10-CM | POA: Diagnosis not present

## 2022-06-12 DIAGNOSIS — M6281 Muscle weakness (generalized): Secondary | ICD-10-CM | POA: Diagnosis not present

## 2022-06-12 DIAGNOSIS — R55 Syncope and collapse: Secondary | ICD-10-CM | POA: Diagnosis not present

## 2022-06-12 MED ORDER — ASPIRIN 81 MG PO TBEC: 81.0000 mg | DELAYED_RELEASE_TABLET | Freq: Every day | ORAL | 12 refills | Status: DC

## 2022-06-12 MED ORDER — CIPROFLOXACIN HCL 0.3 % OP SOLN: 2.0000 [drp] | Freq: Four times a day (QID) | OPHTHALMIC | 0 refills | Status: AC

## 2022-06-12 MED ORDER — LISINOPRIL 20 MG PO TABS: 20.0000 mg | ORAL_TABLET | Freq: Every day | ORAL | Status: DC

## 2022-06-12 MED ORDER — CIPROFLOXACIN HCL 0.3 % OP SOLN: 1.0000 [drp] | Freq: Three times a day (TID) | OPHTHALMIC | 0 refills | Status: DC

## 2022-06-12 MED ORDER — HYDROCODONE-ACETAMINOPHEN 5-325 MG PO TABS: 1.0000 | ORAL_TABLET | Freq: Four times a day (QID) | ORAL | 0 refills | Status: AC | PRN

## 2022-06-12 NOTE — Progress Notes (Signed)
Provider:   Location:   [facility] Room Number: 111 Place of Service:  SNF (31)  PCP: Dion Body, MD Patient Care Team: Dion Body, MD as PCP - General (Family Medicine)  Extended Emergency Contact Information Primary Emergency Contact: [place] [address] [place] Phone: [phone] Mobile Phone: [phone] Relation: Daughter  Code Status: Full Code Goals of Care: Advanced Directive information    06/10/2022    8:00 AM  Advanced Directives  Does Patient Have a Medical Advance Directive? No  Would patient like information on creating a medical advance directive? No - Patient declined      Chief Complaint  Patient presents with   Loss of Consciousness    HPI: Patient is a 87 y.o. female seen today for admission for rehav after a fall. She says she was walking down the hall and all of a sudden felt week hit the wall and slid to the floor. She lost consciousness.  Due to hypoxia vs symptomatic bradycardia. She had an injury of shoulder She has some pain in the shoulder. Pain well-controlled with prescibed medications and tylenol. Sling is slightly oversized.   New oxygen requirement: new to wearing oxygen. Had desaturation to the 70s at the time of hospitalization.   New Feet swelling this is fairly new for her, started in the hospital.  Father and mother natural causes at 72-95 yo She has one son and one daughter. Both healthy.  She has been very active walking, cycling, etc. She has a group of friends that meet and "go out every weekend.   She has one brother colon cacner   Her daughter Warrick Parisian is at bedside throughout the conversation.   Past Medical History:  Diagnosis Date   Cholelithiasis    Chronic diarrhea    Colon polyp    Diverticulosis    GERD (gastroesophageal reflux disease)    if she eats acidic foods   Hypertension    Mucoid cyst of joint    left index finger    Osteoporosis    Past Surgical History:  Procedure Laterality Date   APPENDECTOMY     BIOPSY  05/19/2019   Procedure: BIOPSY;  Surgeon: Rogene Houston, MD;  [facility];  Service: Endoscopy;;  sigmoid colon   CHOLECYSTECTOMY     COLON SURGERY     Removed large polyps   COLONOSCOPY     COLONOSCOPY N/A 05/18/2015   Procedure: COLONOSCOPY;  Surgeon: Rogene Houston, MD;  Location: [facility];  Service: Endoscopy;  Laterality: N/A;  1030   COLONOSCOPY N/A 05/19/2019   Procedure: COLONOSCOPY;  Surgeon: Rogene Houston, MD;  [facility]: [facility];  Service: Endoscopy;  Laterality: N/A;  8:30   GANGLION CYST EXCISION Left 03/2019   the fore finger   IR VERTEBROPLASTY LUMBAR BX INC UNI/BIL INC/INJECT/IMAGING  07/27/2020   MASS EXCISION Left 03/02/2019   Procedure: EXCISION CYST, DEBRIDEMENT PROXIMAL INTERPHALANGEAL JOINT LEFT INDEX FINGER;  Surgeon: Daryll Brod, MD;  Location: [place];  Service: Orthopedics;  Laterality: Left;   RECTAL SURGERY  2000s   [facility]   TONSILLECTOMY      reports that she has quit smoking. She has never used smokeless tobacco. She reports current alcohol use. She reports that she does not use drugs. Social History   Socioeconomic History   Marital status: Widowed    Spouse name: Not on file  Number of children: Not on file   Years of education: Not on file   Highest education level: Not on file  Occupational History   Not on file  Tobacco Use   Smoking status: Former   Smokeless tobacco: Never  Vaping Use   Vaping Use: Never used  Substance and Sexual Activity   Alcohol use: Yes    Comment: occ   Drug use: No   Sexual activity: Not on file  Other Topics Concern   Not on file  Social History Narrative   Not on file   Social Determinants of Health   Financial Resource Strain: Not on file  Food Insecurity: Not on file  Transportation Needs: Not on file  Physical Activity: Not on file  Stress: Not on file   Social Connections: Not on file  Intimate Partner Violence: Not on file    Functional Status Survey:    Family History  Problem Relation Age of Onset   Cancer - Colon Brother     Health Maintenance  Topic Date Due   DEXA SCAN  Never done   Zoster Vaccines- Shingrix (2 of 2) 09/08/2017   COVID-19 Vaccine (6 - Moderna risk series) 08/21/2021   INFLUENZA VACCINE  05/14/2022   TETANUS/TDAP  09/10/2022   Pneumonia Vaccine 3+ Years old  Completed   HPV VACCINES  Aged Out    Allergies  Allergen Reactions   Hydrochlorothiazide Other (See Comments)    Hyponatremia (10/2021)   Nifedipine Swelling    Leg swelling  Swelling of feet with redness and elevated BP   Sulfa Antibiotics Rash, Hives and Other (See Comments)    Outpatient Encounter Medications as of 06/14/2022  Medication Sig   albuterol (VENTOLIN HFA) 108 (90 Base) MCG/ACT inhaler Inhale 2 puffs into the lungs every 6 (six) hours as needed for wheezing.   alendronate (FOSAMAX) 70 MG tablet Take 70 mg by mouth every Sunday.   aspirin EC 81 MG tablet Take 1 tablet (81 mg total) by mouth daily. Swallow whole.   cholecalciferol (VITAMIN D3) 25 MCG (1000 UNIT) tablet Take 1,000 Units by mouth daily.   ciprofloxacin (CILOXAN) 0.3 % ophthalmic solution Place 2 drops into both eyes 4 (four) times daily for 5 days.   Cyanocobalamin (B-12) 5000 MCG CAPS Take 5,000 mcg by mouth daily.   diclofenac sodium (VOLTAREN) 1 % GEL Apply 2 g topically 4 (four) times daily.   HYDROcodone-acetaminophen (NORCO/VICODIN) 5-325 MG tablet Take 1 tablet by mouth every 6 (six) hours as needed for up to 5 days for severe pain or moderate pain.   hydroxychloroquine (PLAQUENIL) 200 MG tablet Take 200 mg by mouth daily.   lisinopril (ZESTRIL) 20 MG tablet Take 1 tablet (20 mg total) by mouth daily.   Multiple Vitamins-Minerals (PRESERVISION AREDS 2+MULTI VIT) CAPS Take 1 tablet by mouth daily.   mycophenolate (CELLCEPT) 500 MG tablet Take 500 mg by mouth  2 (two) times daily. Before meals   pantoprazole (PROTONIX) 40 MG tablet Take 40 mg by mouth daily.   [DISCONTINUED] acetaminophen (TYLENOL) tablet 650 mg    [DISCONTINUED] albuterol (PROVENTIL) (2.5 MG/3ML) 0.083% nebulizer solution 2.5 mg    [DISCONTINUED] ALPRAZolam (XANAX) tablet 0.25 mg    [DISCONTINUED] aspirin EC tablet 81 mg    [DISCONTINUED] atorvastatin (LIPITOR) tablet 80 mg    [DISCONTINUED] cholecalciferol (VITAMIN D3) 25 MCG (1000 UNIT) tablet 1,000 Units    [DISCONTINUED] ciprofloxacin (CILOXAN) 0.3 % ophthalmic solution 1 drop    [DISCONTINUED] cyanocobalamin (VITAMIN B12)  tablet 2,000 mcg    [DISCONTINUED] enoxaparin (LOVENOX) injection 30 mg    [DISCONTINUED] HYDROcodone-acetaminophen (NORCO/VICODIN) 5-325 MG per tablet 1-2 tablet    [DISCONTINUED] hydroxychloroquine (PLAQUENIL) tablet 200 mg    [DISCONTINUED] lisinopril (ZESTRIL) tablet 20 mg    [DISCONTINUED] magnesium hydroxide (MILK OF MAGNESIA) suspension 30 mL    [DISCONTINUED] morphine (PF) 2 MG/ML injection 1 mg    [DISCONTINUED] mycophenolate (CELLCEPT) capsule 500 mg    [DISCONTINUED] nitroGLYCERIN (NITROSTAT) SL tablet 0.4 mg    [DISCONTINUED] ondansetron (ZOFRAN) injection 4 mg    [DISCONTINUED] pantoprazole (PROTONIX) EC tablet 40 mg    [DISCONTINUED] traZODone (DESYREL) tablet 25 mg    No facility-administered encounter medications on file as of 06/14/2022.    Review of Systems  Constitutional:  Positive for activity change.  Eyes:  Positive for redness.  Respiratory:         New oxygen, some SOB with ambiulation  Cardiovascular:  Negative for chest pain.  Genitourinary:  Negative for difficulty urinating.  Neurological:  Negative for dizziness.    Vitals:   06/14/22 2016  BP: 118/67  Pulse: 96  Resp: 20  Temp: (!) 97.4 F (36.3 C)  SpO2: 97%  Weight: 110 lb 6.4 oz (50.1 kg)   Body mass index is 19.56 kg/m. Physical Exam Constitutional:      Appearance: Normal appearance.  Eyes:      Comments: Erythematous conjunctivae bilaterally   Cardiovascular:     Rate and Rhythm: Normal rate and regular rhythm.     Pulses: Normal pulses.     Heart sounds: Normal heart sounds.  Pulmonary:     Effort: Pulmonary effort is normal.     Breath sounds: Normal breath sounds.  Musculoskeletal:     Comments: Left arm in sling, tenderness to palpation and with slight movements. 2+ pulses bilateral radial pulses.   Skin:    General: Skin is warm and dry.  Neurological:     General: No focal deficit present.     Mental Status: She is alert and oriented to person, place, and time.  Psychiatric:        Mood and Affect: Mood normal.     Labs reviewed: Basic Metabolic Panel: Recent Labs    10/25/21 0032 10/25/21 0608 04/09/22 0244 04/15/22 0938 06/08/22 2037  NA  --  133* 136 137 138  K  --  3.4* 3.7 3.5 4.0  CL  --  100 103 100 103  CO2  --  '27 27 28 27  '$ GLUCOSE  --  88 83 102* 119*  BUN  --  '18 16 15 21  '$ CREATININE  --  0.98 1.04* 1.04* 1.15*  CALCIUM  --  8.0* 8.9 8.9 8.8*  MG 1.6* 2.0  --   --   --   PHOS  --  2.6  --   --   --    Liver Function Tests: Recent Labs    10/24/21 2102  AST 20  ALT 12  ALKPHOS 48  BILITOT 1.0  PROT 6.6  ALBUMIN 4.1   No results for input(s): "LIPASE", "AMYLASE" in the last 8760 hours. No results for input(s): "AMMONIA" in the last 8760 hours. CBC: Recent Labs    10/24/21 2102 04/09/22 0244 04/15/22 0938 06/08/22 2037  WBC 9.9 6.5 7.4 8.4  NEUTROABS 7.3 4.2  --   --   HGB 14.3 13.9 14.6 13.9  HCT 41.6 43.8 45.9 42.9  MCV 91.8 99.1 98.1 98.2  PLT 252 196  236 217   Cardiac Enzymes: No results for input(s): "CKTOTAL", "CKMB", "CKMBINDEX", "TROPONINI" in the last 8760 hours. BNP: Invalid input(s): "POCBNP" No results found for: "HGBA1C" No results found for: "TSH" Lab Results  Component Value Date   VITAMINB12 3,117 (H) 10/25/2021   No results found for: "FOLATE" No results found for: "IRON", "TIBC",  "FERRITIN"  Imaging and Procedures obtained prior to SNF admission: DG Chest Port 1 View  Result Date: 06/09/2022 CLINICAL DATA:  Syncope EXAM: PORTABLE CHEST 1 VIEW COMPARISON:  10/24/2021.  Left humerus 06/08/2022 FINDINGS: Extensive chronic lung disease/fibrosis throughout the lungs bilaterally, left greater than right. No acute confluent airspace opacities or effusions. Heart is normal size. Left humeral neck fracture noted as seen on humerus series yesterday. IMPRESSION: Extensive chronic lung disease/fibrosis. No active disease. Electronically Signed   By: Rolm Baptise M.D.   On: 06/09/2022 01:58   CT Head Wo Contrast  Result Date: 06/09/2022 CLINICAL DATA:  Mental status changes, unknown cause. EXAM: CT HEAD WITHOUT CONTRAST TECHNIQUE: Contiguous axial images were obtained from the base of the skull through the vertex without intravenous contrast. RADIATION DOSE REDUCTION: This exam was performed according to the departmental dose-optimization program which includes automated exposure control, adjustment of the mA and/or kV according to patient size and/or use of iterative reconstruction technique. COMPARISON:  MRI brain report 09/17/2017. Images unavailable in PACS at time of reading. FINDINGS: Brain: There is mild global atrophy and moderately developed small-vessel disease of the cerebral white matter. There is a chronic right thalamic lacunar infarct. No acute cortical based infarct, hemorrhage, mass or mass effect, or midline shift are seen. There is trace mineralization in the posterior basal ganglia. Vascular: The carotid siphons are moderately calcified. No hyperdense central vessel is seen. Skull: Negative for fracture or focal lesion. Sinuses/Orbits: There is mild membrane disease in the maxillary and ethmoid sinuses without fluid level. Other sinuses are clear. Mild S shaped nasal septum. Old lens extractions. Other: Minimal fluid in both mastoid tips. Rest of the mastoids are clear. Both  middle ears are clear. IMPRESSION: 1. No acute intracranial CT findings.  Chronic change. 2. Sinus membrane disease. 3. Fluid in the mastoid tips. 4. Carotid atherosclerosis. Electronically Signed   By: Telford Nab M.D.   On: 06/09/2022 00:51 none   Assessment/Plan 1. Closed nondisplaced fracture of surgical neck of left humerus with nonunion, unspecified fracture morphology, subsequent encounter Patient with recent injury with ground level fall. Non operative in the acute setting. Will have shoulder reevaluated by orthopedic surgery in 2 weeks.  - Continue pain control with tylenol and hydrocodone q6 hours PRN.  - Continue sling in place - Continue PT/OT support  - Continue osteoporosis treatment with alendronate 70 mg weekly  2. Syncope, cardiogenic Syncopal episode last week thought to be due to symptomatic bradycardia. Beta blocker was discontinued. Holter monitor in place.  - Cardiology in 1 week. - Continue to monitor HR and symptoms.   3. Rheumatoid arthritis; Raynaud's disease without gangrene Symptoms well-managed with current treatment of mycophenylate and hydroxychloroquine  4. NSTEMI (non-ST elevated myocardial infarction) [facility]) Patient is asymptomatic at this time. Holter monitor in place.  - Continue ASA, statin allergy.   5. Gastroesophageal reflux disease without esophagitis Continue Pantoprazole  6. Pulmonary emphysema, unspecified emphysema type ([place]) Patient has a history of emphysema. Unclear if new oxygen requirement is indefinitely or due to progression of emphysema. Discussed importance of therapy. Encourage exercises such as Incentive spirometry.  - Continue PRN albuterol  7. Essential hypertension BP well controlled on current regimen with lisinopril 20 mg daily. Continue to monitor BP as BP is borderline low for patient's age. Previously well-controlled on 10 mg daily.   8. Stage 3a chronic kidney disease ([place]) Kidney disease stable. Continue to monitor  with q45moBMP.   9. Acute conjunctivitis of both eyes, unspecified acute conjunctivitis type Continue clindamycin ophthalmic drops.    Family/ staff Communication: Daughter Malisa at bedside.   Labs/tests ordered: none  I spent >45 minutes in face-to face patient care, chart review of internal and external records, and patient education.    VTomasa Rand MD, [facility] [phone]

## 2022-06-12 NOTE — Discharge Summary (Addendum)
Physician Discharge Summary   Morgan Sandoval  female DOB: 03/27/1935  HBZ:[id number]  PCP: Dion Body, MD  Admit date: 06/09/2022 Discharge date: 06/12/2022  Admitted From: home Disposition:  SNF rehab CODE STATUS: Full code  Discharge Instructions     Diet general   Complete by: As directed       Hospital Course:  For full details, please see H&P, progress notes, consult notes and ancillary notes.  Briefly,  Morgan Sandoval is a 87 y.o. Caucasian female with medical history significant for hypertension, and Raynaud's disease who presented to the emergency room with acute onset of syncope when she was walking back to her apartment at [facility] after visiting a friend.  She was having left upper arm pain and had vomiting for which she received 4 mg of IM Zofran by EMS en route to the ER.   Syncope with fall -- patient came in after she had an accident fall and had episode of vomiting. -- Etiology unclear. -- Heart rate 49 --60. Discontinued home bisoprolol. No high-grade AV block or pauses noted on telemetry. -- seen by cardiology Dr. Clayborn Bigness -- Continue baby aspirin. -- outpatient 7 day Holter monitor at discharge (already placed) -- echo can be done as outpatient per cardiology --f/u with Dr. Clayborn Bigness 2 weeks after discharge.   Nondisplaced left humeral head fracture --seen by Dr. Earnestine Leys -cont Sling which can be removed for bathing. --f/u with Dr. Sabra Heck in 7-10 days after discharge.   COPD and chronic pulm fibrosis Chronic hypoxemic respiratory failure --pt as dyspnea at baseline especially with exertion.  Pt was found to desat to 87% on room air at rest, and was put on 2L supplemental O2, which pt reported helping.  CXR showed no acute finding, just Extensive chronic lung disease/fibrosis.  Pt likely has chronic hypoxemic respiratory failure and intermittently desat.  Pt is discharged with 2L O2 to SNF rehab, and will likely need it after she goes  home.  Hypertension -- increased lisinopril 20 mg daily --Discontinued home bisoprolol.         Elevated troponin appears demand ischemia with fall and fracture NSTEMI ruled out --cardiology evaluated.  H/o RA follows with dr patel at [facility] --cont plaquenil and cellcept   Possible bacterial conjunctivitis  --cipro eye drops QID for 5 days   Unless noted above, medications under "STOP" list are ones pt was not taking PTA.  Discharge Diagnoses:  Principal Problem:   NSTEMI (non-ST elevated myocardial infarction) ([place]) Active Problems:   COPD with chronic bronchitis (HCC)   GERD (gastroesophageal reflux disease)   Essential hypertension   Syncope, cardiogenic   Syncope   30 Day Unplanned Readmission Risk Score    Flowsheet Row ED from 06/09/2022 in [facility]  30 Day Unplanned Readmission Risk Score (%) 13.86 Filed at 06/12/2022 0801       This score is the patient's risk of an unplanned readmission within 30 days of being discharged (0 -100%). The score is based on dignosis, age, lab data, medications, orders, and past utilization.   Low:  0-14.9   Medium: 15-21.9   High: 22-29.9   Extreme: 30 and above         Discharge Instructions:  Allergies as of 06/12/2022       Reactions   Hydrochlorothiazide    Nifedipine Swelling   Leg swelling   Sulfa Antibiotics Rash, Hives        Medication List  STOP taking these medications    bisoprolol 5 MG tablet Commonly known as: ZEBETA   ipratropium 0.06 % nasal spray Commonly known as: ATROVENT   Nitro-Bid 2 % ointment Generic drug: nitroGLYCERIN       TAKE these medications    albuterol 108 (90 Base) MCG/ACT inhaler Commonly known as: VENTOLIN HFA Inhale 2 puffs into the lungs every 6 (six) hours as needed for wheezing.   alendronate 70 MG tablet Commonly known as: FOSAMAX Take 70 mg by mouth every Sunday.   aspirin EC 81 MG tablet Take 1 tablet (81 mg  total) by mouth daily. Swallow whole.   B-12 5000 MCG Caps Take 5,000 mcg by mouth daily.   cholecalciferol 25 MCG (1000 UNIT) tablet Commonly known as: VITAMIN D3 Take 1,000 Units by mouth daily.   ciprofloxacin 0.3 % ophthalmic solution Commonly known as: CILOXAN Place 2 drops into both eyes 4 (four) times daily for 5 days.   diclofenac sodium 1 % Gel Commonly known as: VOLTAREN Apply 2 g topically 4 (four) times daily.   HYDROcodone-acetaminophen 5-325 MG tablet Commonly known as: NORCO/VICODIN Take 1 tablet by mouth every 6 (six) hours as needed for up to 5 days for severe pain or moderate pain.   hydroxychloroquine 200 MG tablet Commonly known as: PLAQUENIL Take 200 mg by mouth daily.   lisinopril 20 MG tablet Commonly known as: ZESTRIL Take 1 tablet (20 mg total) by mouth daily. What changed:  medication strength how much to take Another medication with the same name was removed. Continue taking this medication, and follow the directions you see here.   mycophenolate 500 MG tablet Commonly known as: CELLCEPT Take 500 mg by mouth 2 (two) times daily. Before meals   pantoprazole 40 MG tablet Commonly known as: PROTONIX Take 40 mg by mouth daily.   PreserVision AREDS 2+Multi Vit Caps Take 1 tablet by mouth daily.         Contact information for follow-up providers     Yolonda Kida, MD. Go in 2 week(s).   Specialties: Cardiology, Internal Medicine Contact information: [place] [address] [phone]         Dion Body, MD Follow up.   Specialty: Family Medicine Contact information: [place] [address] [phone]              Contact information for after-discharge care     Destination     [facility] .   Service: Skilled Nursing Contact information: [place] [address] [phone]                      Allergies  Allergen Reactions   Hydrochlorothiazide    Nifedipine Swelling    Leg swelling    Sulfa Antibiotics Rash and Hives     The results of significant diagnostics from this hospitalization (including imaging, microbiology, ancillary and laboratory) are listed below for reference.   Consultations:   Procedures/Studies: DG Chest Port 1 View  Result Date: 06/09/2022 CLINICAL DATA:  Syncope EXAM: PORTABLE CHEST 1 VIEW COMPARISON:  10/24/2021.  Left humerus 06/08/2022 FINDINGS: Extensive chronic lung disease/fibrosis throughout the lungs bilaterally, left greater than right. No acute confluent airspace opacities or effusions. Heart is normal size. Left humeral neck fracture noted as seen on humerus series yesterday. IMPRESSION: Extensive chronic lung disease/fibrosis. No active disease. Electronically Signed   By: Rolm Baptise M.D.  On: 06/09/2022 01:58   CT Head Wo Contrast  Result Date: 06/09/2022 CLINICAL DATA:  Mental status changes, unknown cause. EXAM: CT HEAD WITHOUT CONTRAST TECHNIQUE: Contiguous axial images were obtained from the base of the skull through the vertex without intravenous contrast. RADIATION DOSE REDUCTION: This exam was performed according to the departmental dose-optimization program which includes automated exposure control, adjustment of the mA and/or kV according to patient size and/or use of iterative reconstruction technique. COMPARISON:  MRI brain report 09/17/2017. Images unavailable in PACS at time of reading. FINDINGS: Brain: There is mild global atrophy and moderately developed small-vessel disease of the cerebral white matter. There is a chronic right thalamic lacunar infarct. No acute cortical based infarct, hemorrhage, mass or mass effect, or midline shift are seen. There is trace mineralization in the posterior basal ganglia. Vascular: The carotid siphons are moderately calcified. No hyperdense central vessel is seen. Skull: Negative  for fracture or focal lesion. Sinuses/Orbits: There is mild membrane disease in the maxillary and ethmoid sinuses without fluid level. Other sinuses are clear. Mild S shaped nasal septum. Old lens extractions. Other: Minimal fluid in both mastoid tips. Rest of the mastoids are clear. Both middle ears are clear. IMPRESSION: 1. No acute intracranial CT findings.  Chronic change. 2. Sinus membrane disease. 3. Fluid in the mastoid tips. 4. Carotid atherosclerosis. Electronically Signed   By: Telford Nab M.D.   On: 06/09/2022 00:51   DG Humerus Left  Result Date: 06/08/2022 CLINICAL DATA:  Recent fall with shoulder pain, initial encounter EXAM: LEFT HUMERUS - 2+ VIEW COMPARISON:  None Available. FINDINGS: There is a fracture involving the proximal left humerus at the level of the surgical neck. Focal avulsion involving the greater tuberosity is noted as well. Distal humerus is unremarkable. Remainder of the shoulder girdle appears within normal limits. IMPRESSION: Proximal left humeral fracture involving the surgical neck as well as the greater tuberosity. Electronically Signed   By: Inez Catalina M.D.   On: 06/08/2022 21:13   VAS [place] DOP BILAT COMP TOS DIGITS REYNAUD  Result Date: 05/30/2022 UPPER EXTREMITY DOPPLER STUDY Patient Name:  KATHLEE BARNHARDT  Date of Exam:   05/30/2022 Medical Rec #: [id number]          Accession #:    [id number] Date of Birth: October 21, 1934           Patient Gender: F Patient Age:   89 years Exam Location:  [facility] Procedure:      VAS UE DOPPLER BILAT/COMP TOS, DIGITS (TO&UE REYNAUDS) Referring Phys: GREGORY SCHNIER --------------------------------------------------------------------------------  Indications: Reynauds.  Performing Technologist: Almira Coaster RVS  Examination Guidelines: A complete evaluation includes B-mode imaging, spectral Doppler, color Doppler, and power Doppler as needed of all accessible portions of each vessel. Bilateral testing is considered an  integral part of a complete examination. Limited examinations for reoccurring indications may be performed as noted.  Technologist Notes: Right: Digital PPG tracings obtained appear appropriately pulsatile. Left: Digital PPG tracings obtained appear appropriately pulsatile.  Electronically signed by Hortencia Pilar MD on 05/30/2022 at 4:41:03 PM.    Final       Labs: BNP (last 3 results) No results for input(s): "BNP" in the last 8760 hours. Basic Metabolic Panel: Recent Labs  Lab 06/08/22 2037  NA 138  K 4.0  CL 103  CO2 27  GLUCOSE 119*  BUN 21  CREATININE 1.15*  CALCIUM 8.8*   Liver Function Tests: No results for input(s): "AST", "ALT", "ALKPHOS", "BILITOT", "PROT", "  ALBUMIN" in the last 168 hours. No results for input(s): "LIPASE", "AMYLASE" in the last 168 hours. No results for input(s): "AMMONIA" in the last 168 hours. CBC: Recent Labs  Lab 06/08/22 2037  WBC 8.4  HGB 13.9  HCT 42.9  MCV 98.2  PLT 217   Cardiac Enzymes: No results for input(s): "CKTOTAL", "CKMB", "CKMBINDEX", "TROPONINI" in the last 168 hours. BNP: Invalid input(s): "POCBNP" CBG: No results for input(s): "GLUCAP" in the last 168 hours. D-Dimer No results for input(s): "DDIMER" in the last 72 hours. Hgb A1c No results for input(s): "HGBA1C" in the last 72 hours. Lipid Profile No results for input(s): "CHOL", "HDL", "LDLCALC", "TRIG", "CHOLHDL", "LDLDIRECT" in the last 72 hours. Thyroid function studies No results for input(s): "TSH", "T4TOTAL", "T3FREE", "THYROIDAB" in the last 72 hours.  Invalid input(s): "FREET3" Anemia work up No results for input(s): "VITAMINB12", "FOLATE", "FERRITIN", "TIBC", "IRON", "RETICCTPCT" in the last 72 hours. Urinalysis    Component Value Date/Time   COLORURINE YELLOW (A) 06/10/2022 1030   APPEARANCEUR HAZY (A) 06/10/2022 1030   LABSPEC 1.027 06/10/2022 1030   PHURINE 5.0 06/10/2022 1030   GLUCOSEU NEGATIVE 06/10/2022 1030   HGBUR NEGATIVE 06/10/2022 1030    BILIRUBINUR NEGATIVE 06/10/2022 1030   KETONESUR NEGATIVE 06/10/2022 1030   PROTEINUR NEGATIVE 06/10/2022 1030   NITRITE NEGATIVE 06/10/2022 1030   LEUKOCYTESUR NEGATIVE 06/10/2022 1030   Sepsis Labs Recent Labs  Lab 06/08/22 2037  WBC 8.4   Microbiology No results found for this or any previous visit (from the past 240 hour(s)).   Total time spend on discharging this patient, including the last patient exam, discussing the hospital stay, instructions for ongoing care as it relates to all pertinent caregivers, as well as preparing the medical discharge records, prescriptions, and/or referrals as applicable, is 35 minutes.    Enzo Bi, MD  [facility] 06/12/2022, 10:34 AM

## 2022-06-12 NOTE — TOC Progression Note (Signed)
Transition of Care [facility]) - Progression Note    Patient Details  Name: Morgan Sandoval MRN: [id number] Date of Birth: [birth year]  [facility]) CM/SW Contact  Shelbie Hutching, RN Phone Number: 06/12/2022, 10:57 AM  Clinical Narrative:    Patient is on new oxygen so [facility] cannot provide transport.  ED Secretary will set up EMS transport.     Expected Discharge Plan: [place] Barriers to Discharge: Barriers Resolved  Expected Discharge Plan and Services Expected Discharge Plan: [place]   Discharge Planning Services: CM Consult Post Acute Care Choice: [place] Living arrangements for the past 2 months: [place] Expected Discharge Date: 06/12/22               DME Arranged: N/A DME Agency: NA       HH Arranged: NA HH Agency: NA         Social Determinants of Health (SDOH) Interventions    Readmission Risk Interventions     No data to display

## 2022-06-12 NOTE — TOC Transition Note (Signed)
Transition of Care [facility]) - CM/SW Discharge Note   Patient Details  Name: Morgan Sandoval MRN: [id number] Date of Birth: September 07, 1935  Transition of Care [facility]) CM/SW Contact:  Shelbie Hutching, RN Phone Number: 06/12/2022, 10:50 AM   Clinical Narrative:    Patient will discharge to [facility] today.  Insurance authorization approved- auth # [id number], reference [id number].  Approved from 8/29-8/31. Daughter notified of discharge today.  Patient will be going to room 111, ED RN to call report to [phone].  Waiting to hear back from [facility] to see if they can provide transportation.     Final next level of care: Skilled Nursing Facility Barriers to Discharge: Barriers Resolved   Patient Goals and CMS Choice Patient states their goals for this hospitalization and ongoing recovery are:: Discharge to SNF today CMS Medicare.gov Compare Post Acute Care list provided to:: Patient Choice offered to / list presented to : Patient  Discharge Placement              Patient chooses bed at: [facility] Patient to be transferred to facility by: [facility] Knows to Provide Transportation Name of family member notified: Daughter [facility] Patient and family notified of of transfer: 06/12/22  Discharge Plan and Services   Discharge Planning Services: CM Consult Post Acute Care Choice: [place]          DME Arranged: N/A DME Agency: NA       HH Arranged: NA HH Agency: NA        Social Determinants of Health (SDOH) Interventions     Readmission Risk Interventions     No data to display

## 2022-06-12 NOTE — ED Notes (Signed)
Pt up with one person assist to bedside commode. Pt is alert and oriented, using call bell appropriately for safety. Pt has sling in place for left humerus fx. Pt has red rimmed eyes, has not been crying. Pt told this nurse that she is hopefully to get placement tomorrow so she can get out of the hospital.

## 2022-06-12 NOTE — Progress Notes (Signed)
IVs removed from patient. Discharge instructions printed. Report given to [facility]. EMS called for transport. Patient in no acute distress at this time and updated on plan of care.

## 2022-06-14 ENCOUNTER — Encounter: Payer: Self-pay | Admitting: Student

## 2022-06-14 ENCOUNTER — Non-Acute Institutional Stay (SKILLED_NURSING_FACILITY): Payer: Medicare Other | Admitting: Student

## 2022-06-14 DIAGNOSIS — H1033 Unspecified acute conjunctivitis, bilateral: Secondary | ICD-10-CM | POA: Diagnosis not present

## 2022-06-14 DIAGNOSIS — R55 Syncope and collapse: Secondary | ICD-10-CM | POA: Diagnosis not present

## 2022-06-14 DIAGNOSIS — I7300 Raynaud's syndrome without gangrene: Secondary | ICD-10-CM

## 2022-06-14 DIAGNOSIS — N1831 Chronic kidney disease, stage 3a: Secondary | ICD-10-CM

## 2022-06-14 DIAGNOSIS — M069 Rheumatoid arthritis, unspecified: Secondary | ICD-10-CM

## 2022-06-14 DIAGNOSIS — J439 Emphysema, unspecified: Secondary | ICD-10-CM

## 2022-06-14 DIAGNOSIS — I10 Essential (primary) hypertension: Secondary | ICD-10-CM | POA: Diagnosis not present

## 2022-06-14 DIAGNOSIS — S42215K Unspecified nondisplaced fracture of surgical neck of left humerus, subsequent encounter for fracture with nonunion: Secondary | ICD-10-CM | POA: Diagnosis not present

## 2022-06-14 DIAGNOSIS — I214 Non-ST elevation (NSTEMI) myocardial infarction: Secondary | ICD-10-CM

## 2022-06-14 DIAGNOSIS — K219 Gastro-esophageal reflux disease without esophagitis: Secondary | ICD-10-CM | POA: Diagnosis not present

## 2022-06-20 DIAGNOSIS — T148XXA Other injury of unspecified body region, initial encounter: Secondary | ICD-10-CM | POA: Diagnosis not present

## 2022-06-20 DIAGNOSIS — M25522 Pain in left elbow: Secondary | ICD-10-CM | POA: Diagnosis not present

## 2022-06-20 DIAGNOSIS — S42225A 2-part nondisplaced fracture of surgical neck of left humerus, initial encounter for closed fracture: Secondary | ICD-10-CM | POA: Diagnosis not present

## 2022-06-21 ENCOUNTER — Other Ambulatory Visit: Payer: Self-pay | Admitting: Pulmonary Disease

## 2022-06-21 ENCOUNTER — Ambulatory Visit
Admission: RE | Admit: 2022-06-21 | Discharge: 2022-06-21 | Disposition: A | Discharge status: Home / Self Care | Payer: Medicare Other | Source: Ambulatory Visit | Attending: Pulmonary Disease | Admitting: Pulmonary Disease

## 2022-06-21 DIAGNOSIS — J8410 Pulmonary fibrosis, unspecified: Secondary | ICD-10-CM | POA: Insufficient documentation

## 2022-06-21 DIAGNOSIS — Z23 Encounter for immunization: Secondary | ICD-10-CM | POA: Diagnosis not present

## 2022-06-21 DIAGNOSIS — M7989 Other specified soft tissue disorders: Secondary | ICD-10-CM | POA: Diagnosis not present

## 2022-06-25 DIAGNOSIS — M0510 Rheumatoid lung disease with rheumatoid arthritis of unspecified site: Secondary | ICD-10-CM | POA: Diagnosis not present

## 2022-06-25 DIAGNOSIS — Z741 Need for assistance with personal care: Secondary | ICD-10-CM | POA: Diagnosis not present

## 2022-06-25 DIAGNOSIS — J8410 Pulmonary fibrosis, unspecified: Secondary | ICD-10-CM | POA: Diagnosis not present

## 2022-06-25 DIAGNOSIS — I7300 Raynaud's syndrome without gangrene: Secondary | ICD-10-CM | POA: Diagnosis not present

## 2022-06-25 DIAGNOSIS — R55 Syncope and collapse: Secondary | ICD-10-CM | POA: Diagnosis not present

## 2022-06-25 DIAGNOSIS — S42215D Unspecified nondisplaced fracture of surgical neck of left humerus, subsequent encounter for fracture with routine healing: Secondary | ICD-10-CM | POA: Diagnosis not present

## 2022-06-25 DIAGNOSIS — Z9181 History of falling: Secondary | ICD-10-CM | POA: Diagnosis not present

## 2022-06-25 DIAGNOSIS — M6259 Muscle wasting and atrophy, not elsewhere classified, multiple sites: Secondary | ICD-10-CM | POA: Diagnosis not present

## 2022-06-25 DIAGNOSIS — M6281 Muscle weakness (generalized): Secondary | ICD-10-CM | POA: Diagnosis not present

## 2022-06-25 DIAGNOSIS — R278 Other lack of coordination: Secondary | ICD-10-CM | POA: Diagnosis not present

## 2022-06-25 DIAGNOSIS — M810 Age-related osteoporosis without current pathological fracture: Secondary | ICD-10-CM | POA: Diagnosis not present

## 2022-06-25 DIAGNOSIS — M48061 Spinal stenosis, lumbar region without neurogenic claudication: Secondary | ICD-10-CM | POA: Diagnosis not present

## 2022-06-25 DIAGNOSIS — Z9981 Dependence on supplemental oxygen: Secondary | ICD-10-CM | POA: Diagnosis not present

## 2022-06-26 ENCOUNTER — Other Ambulatory Visit: Payer: Self-pay | Admitting: Pulmonary Disease

## 2022-06-26 DIAGNOSIS — J8410 Pulmonary fibrosis, unspecified: Secondary | ICD-10-CM

## 2022-06-27 DIAGNOSIS — S42225A 2-part nondisplaced fracture of surgical neck of left humerus, initial encounter for closed fracture: Secondary | ICD-10-CM | POA: Diagnosis not present

## 2022-06-27 DIAGNOSIS — Z9981 Dependence on supplemental oxygen: Secondary | ICD-10-CM | POA: Diagnosis not present

## 2022-06-27 DIAGNOSIS — Z9181 History of falling: Secondary | ICD-10-CM | POA: Diagnosis not present

## 2022-06-27 DIAGNOSIS — M6281 Muscle weakness (generalized): Secondary | ICD-10-CM | POA: Diagnosis not present

## 2022-06-27 DIAGNOSIS — M48061 Spinal stenosis, lumbar region without neurogenic claudication: Secondary | ICD-10-CM | POA: Diagnosis not present

## 2022-06-27 DIAGNOSIS — R278 Other lack of coordination: Secondary | ICD-10-CM | POA: Diagnosis not present

## 2022-06-27 DIAGNOSIS — M6259 Muscle wasting and atrophy, not elsewhere classified, multiple sites: Secondary | ICD-10-CM | POA: Diagnosis not present

## 2022-06-27 DIAGNOSIS — I7300 Raynaud's syndrome without gangrene: Secondary | ICD-10-CM | POA: Diagnosis not present

## 2022-06-27 DIAGNOSIS — Z741 Need for assistance with personal care: Secondary | ICD-10-CM | POA: Diagnosis not present

## 2022-06-27 DIAGNOSIS — R55 Syncope and collapse: Secondary | ICD-10-CM | POA: Diagnosis not present

## 2022-06-27 DIAGNOSIS — M0510 Rheumatoid lung disease with rheumatoid arthritis of unspecified site: Secondary | ICD-10-CM | POA: Diagnosis not present

## 2022-06-27 DIAGNOSIS — J8410 Pulmonary fibrosis, unspecified: Secondary | ICD-10-CM | POA: Diagnosis not present

## 2022-06-27 DIAGNOSIS — M810 Age-related osteoporosis without current pathological fracture: Secondary | ICD-10-CM | POA: Diagnosis not present

## 2022-06-27 DIAGNOSIS — S42215D Unspecified nondisplaced fracture of surgical neck of left humerus, subsequent encounter for fracture with routine healing: Secondary | ICD-10-CM | POA: Diagnosis not present

## 2022-06-28 DIAGNOSIS — R55 Syncope and collapse: Secondary | ICD-10-CM | POA: Diagnosis not present

## 2022-06-28 DIAGNOSIS — J449 Chronic obstructive pulmonary disease, unspecified: Secondary | ICD-10-CM | POA: Diagnosis not present

## 2022-06-28 DIAGNOSIS — Z87891 Personal history of nicotine dependence: Secondary | ICD-10-CM | POA: Diagnosis not present

## 2022-06-28 DIAGNOSIS — I10 Essential (primary) hypertension: Secondary | ICD-10-CM | POA: Diagnosis not present

## 2022-06-28 DIAGNOSIS — N1831 Chronic kidney disease, stage 3a: Secondary | ICD-10-CM | POA: Diagnosis not present

## 2022-06-28 DIAGNOSIS — Z79899 Other long term (current) drug therapy: Secondary | ICD-10-CM | POA: Diagnosis not present

## 2022-06-28 DIAGNOSIS — I7300 Raynaud's syndrome without gangrene: Secondary | ICD-10-CM | POA: Diagnosis not present

## 2022-06-28 DIAGNOSIS — R001 Bradycardia, unspecified: Secondary | ICD-10-CM | POA: Diagnosis not present

## 2022-06-28 DIAGNOSIS — R600 Localized edema: Secondary | ICD-10-CM | POA: Diagnosis not present

## 2022-06-29 DIAGNOSIS — M6259 Muscle wasting and atrophy, not elsewhere classified, multiple sites: Secondary | ICD-10-CM | POA: Diagnosis not present

## 2022-06-29 DIAGNOSIS — Z9181 History of falling: Secondary | ICD-10-CM | POA: Diagnosis not present

## 2022-06-29 DIAGNOSIS — M810 Age-related osteoporosis without current pathological fracture: Secondary | ICD-10-CM | POA: Diagnosis not present

## 2022-06-29 DIAGNOSIS — M6281 Muscle weakness (generalized): Secondary | ICD-10-CM | POA: Diagnosis not present

## 2022-06-29 DIAGNOSIS — M0510 Rheumatoid lung disease with rheumatoid arthritis of unspecified site: Secondary | ICD-10-CM | POA: Diagnosis not present

## 2022-06-29 DIAGNOSIS — R55 Syncope and collapse: Secondary | ICD-10-CM | POA: Diagnosis not present

## 2022-06-29 DIAGNOSIS — I7300 Raynaud's syndrome without gangrene: Secondary | ICD-10-CM | POA: Diagnosis not present

## 2022-06-29 DIAGNOSIS — S42215D Unspecified nondisplaced fracture of surgical neck of left humerus, subsequent encounter for fracture with routine healing: Secondary | ICD-10-CM | POA: Diagnosis not present

## 2022-06-29 DIAGNOSIS — J8410 Pulmonary fibrosis, unspecified: Secondary | ICD-10-CM | POA: Diagnosis not present

## 2022-06-29 DIAGNOSIS — Z9981 Dependence on supplemental oxygen: Secondary | ICD-10-CM | POA: Diagnosis not present

## 2022-06-29 DIAGNOSIS — R278 Other lack of coordination: Secondary | ICD-10-CM | POA: Diagnosis not present

## 2022-06-29 DIAGNOSIS — Z741 Need for assistance with personal care: Secondary | ICD-10-CM | POA: Diagnosis not present

## 2022-06-29 DIAGNOSIS — M48061 Spinal stenosis, lumbar region without neurogenic claudication: Secondary | ICD-10-CM | POA: Diagnosis not present

## 2022-07-01 DIAGNOSIS — Z9981 Dependence on supplemental oxygen: Secondary | ICD-10-CM | POA: Diagnosis not present

## 2022-07-01 DIAGNOSIS — Z741 Need for assistance with personal care: Secondary | ICD-10-CM | POA: Diagnosis not present

## 2022-07-01 DIAGNOSIS — M6281 Muscle weakness (generalized): Secondary | ICD-10-CM | POA: Diagnosis not present

## 2022-07-01 DIAGNOSIS — I7300 Raynaud's syndrome without gangrene: Secondary | ICD-10-CM | POA: Diagnosis not present

## 2022-07-01 DIAGNOSIS — Z9181 History of falling: Secondary | ICD-10-CM | POA: Diagnosis not present

## 2022-07-01 DIAGNOSIS — M6259 Muscle wasting and atrophy, not elsewhere classified, multiple sites: Secondary | ICD-10-CM | POA: Diagnosis not present

## 2022-07-01 DIAGNOSIS — R55 Syncope and collapse: Secondary | ICD-10-CM | POA: Diagnosis not present

## 2022-07-01 DIAGNOSIS — R278 Other lack of coordination: Secondary | ICD-10-CM | POA: Diagnosis not present

## 2022-07-01 DIAGNOSIS — J8410 Pulmonary fibrosis, unspecified: Secondary | ICD-10-CM | POA: Diagnosis not present

## 2022-07-01 DIAGNOSIS — S42215D Unspecified nondisplaced fracture of surgical neck of left humerus, subsequent encounter for fracture with routine healing: Secondary | ICD-10-CM | POA: Diagnosis not present

## 2022-07-01 DIAGNOSIS — M0510 Rheumatoid lung disease with rheumatoid arthritis of unspecified site: Secondary | ICD-10-CM | POA: Diagnosis not present

## 2022-07-01 DIAGNOSIS — M48061 Spinal stenosis, lumbar region without neurogenic claudication: Secondary | ICD-10-CM | POA: Diagnosis not present

## 2022-07-01 DIAGNOSIS — M810 Age-related osteoporosis without current pathological fracture: Secondary | ICD-10-CM | POA: Diagnosis not present

## 2022-07-02 DIAGNOSIS — R278 Other lack of coordination: Secondary | ICD-10-CM | POA: Diagnosis not present

## 2022-07-02 DIAGNOSIS — R55 Syncope and collapse: Secondary | ICD-10-CM | POA: Diagnosis not present

## 2022-07-02 DIAGNOSIS — Z9181 History of falling: Secondary | ICD-10-CM | POA: Diagnosis not present

## 2022-07-02 DIAGNOSIS — J8410 Pulmonary fibrosis, unspecified: Secondary | ICD-10-CM | POA: Diagnosis not present

## 2022-07-02 DIAGNOSIS — Z9981 Dependence on supplemental oxygen: Secondary | ICD-10-CM | POA: Diagnosis not present

## 2022-07-02 DIAGNOSIS — M810 Age-related osteoporosis without current pathological fracture: Secondary | ICD-10-CM | POA: Diagnosis not present

## 2022-07-02 DIAGNOSIS — I7300 Raynaud's syndrome without gangrene: Secondary | ICD-10-CM | POA: Diagnosis not present

## 2022-07-02 DIAGNOSIS — M0510 Rheumatoid lung disease with rheumatoid arthritis of unspecified site: Secondary | ICD-10-CM | POA: Diagnosis not present

## 2022-07-02 DIAGNOSIS — S42215D Unspecified nondisplaced fracture of surgical neck of left humerus, subsequent encounter for fracture with routine healing: Secondary | ICD-10-CM | POA: Diagnosis not present

## 2022-07-02 DIAGNOSIS — M6259 Muscle wasting and atrophy, not elsewhere classified, multiple sites: Secondary | ICD-10-CM | POA: Diagnosis not present

## 2022-07-02 DIAGNOSIS — Z741 Need for assistance with personal care: Secondary | ICD-10-CM | POA: Diagnosis not present

## 2022-07-02 DIAGNOSIS — M48061 Spinal stenosis, lumbar region without neurogenic claudication: Secondary | ICD-10-CM | POA: Diagnosis not present

## 2022-07-02 DIAGNOSIS — M6281 Muscle weakness (generalized): Secondary | ICD-10-CM | POA: Diagnosis not present

## 2022-07-03 DIAGNOSIS — M6281 Muscle weakness (generalized): Secondary | ICD-10-CM | POA: Diagnosis not present

## 2022-07-03 DIAGNOSIS — M6259 Muscle wasting and atrophy, not elsewhere classified, multiple sites: Secondary | ICD-10-CM | POA: Diagnosis not present

## 2022-07-03 DIAGNOSIS — S42215D Unspecified nondisplaced fracture of surgical neck of left humerus, subsequent encounter for fracture with routine healing: Secondary | ICD-10-CM | POA: Diagnosis not present

## 2022-07-03 DIAGNOSIS — R278 Other lack of coordination: Secondary | ICD-10-CM | POA: Diagnosis not present

## 2022-07-03 DIAGNOSIS — Z741 Need for assistance with personal care: Secondary | ICD-10-CM | POA: Diagnosis not present

## 2022-07-03 DIAGNOSIS — Z9981 Dependence on supplemental oxygen: Secondary | ICD-10-CM | POA: Diagnosis not present

## 2022-07-03 DIAGNOSIS — Z9181 History of falling: Secondary | ICD-10-CM | POA: Diagnosis not present

## 2022-07-03 DIAGNOSIS — I7300 Raynaud's syndrome without gangrene: Secondary | ICD-10-CM | POA: Diagnosis not present

## 2022-07-03 DIAGNOSIS — M0510 Rheumatoid lung disease with rheumatoid arthritis of unspecified site: Secondary | ICD-10-CM | POA: Diagnosis not present

## 2022-07-03 DIAGNOSIS — J8410 Pulmonary fibrosis, unspecified: Secondary | ICD-10-CM | POA: Diagnosis not present

## 2022-07-03 DIAGNOSIS — M48061 Spinal stenosis, lumbar region without neurogenic claudication: Secondary | ICD-10-CM | POA: Diagnosis not present

## 2022-07-03 DIAGNOSIS — R55 Syncope and collapse: Secondary | ICD-10-CM | POA: Diagnosis not present

## 2022-07-03 DIAGNOSIS — M810 Age-related osteoporosis without current pathological fracture: Secondary | ICD-10-CM | POA: Diagnosis not present

## 2022-07-04 DIAGNOSIS — S42212A Unspecified displaced fracture of surgical neck of left humerus, initial encounter for closed fracture: Secondary | ICD-10-CM | POA: Diagnosis not present

## 2022-07-04 DIAGNOSIS — R609 Edema, unspecified: Secondary | ICD-10-CM | POA: Diagnosis not present

## 2022-07-04 LAB — BASIC METABOLIC PANEL
BUN: 13 (ref 4–21)
CO2: 30 — AB (ref 13–22)
Chloride: 100 (ref 99–108)
Creatinine: 0.9 (ref 0.5–1.1)
Glucose: 77
Potassium: 4.1 mEq/L (ref 3.5–5.1)
Sodium: 137 (ref 137–147)

## 2022-07-04 LAB — COMPREHENSIVE METABOLIC PANEL
Calcium: 8.5 — AB (ref 8.7–10.7)
eGFR: 65

## 2022-07-05 ENCOUNTER — Non-Acute Institutional Stay (SKILLED_NURSING_FACILITY): Payer: Medicare Other | Admitting: Student

## 2022-07-05 ENCOUNTER — Encounter: Payer: Self-pay | Admitting: Student

## 2022-07-05 DIAGNOSIS — M6281 Muscle weakness (generalized): Secondary | ICD-10-CM | POA: Diagnosis not present

## 2022-07-05 DIAGNOSIS — N183 Chronic kidney disease, stage 3 unspecified: Secondary | ICD-10-CM

## 2022-07-05 DIAGNOSIS — R278 Other lack of coordination: Secondary | ICD-10-CM | POA: Diagnosis not present

## 2022-07-05 DIAGNOSIS — I7300 Raynaud's syndrome without gangrene: Secondary | ICD-10-CM | POA: Diagnosis not present

## 2022-07-05 DIAGNOSIS — Z741 Need for assistance with personal care: Secondary | ICD-10-CM | POA: Diagnosis not present

## 2022-07-05 DIAGNOSIS — S42215D Unspecified nondisplaced fracture of surgical neck of left humerus, subsequent encounter for fracture with routine healing: Secondary | ICD-10-CM | POA: Diagnosis not present

## 2022-07-05 DIAGNOSIS — M6259 Muscle wasting and atrophy, not elsewhere classified, multiple sites: Secondary | ICD-10-CM | POA: Diagnosis not present

## 2022-07-05 DIAGNOSIS — I10 Essential (primary) hypertension: Secondary | ICD-10-CM | POA: Diagnosis not present

## 2022-07-05 DIAGNOSIS — R55 Syncope and collapse: Secondary | ICD-10-CM

## 2022-07-05 DIAGNOSIS — J8410 Pulmonary fibrosis, unspecified: Secondary | ICD-10-CM | POA: Diagnosis not present

## 2022-07-05 DIAGNOSIS — R600 Localized edema: Secondary | ICD-10-CM

## 2022-07-05 DIAGNOSIS — M48061 Spinal stenosis, lumbar region without neurogenic claudication: Secondary | ICD-10-CM | POA: Diagnosis not present

## 2022-07-05 DIAGNOSIS — N750 Cyst of Bartholin's gland: Secondary | ICD-10-CM | POA: Diagnosis not present

## 2022-07-05 DIAGNOSIS — M810 Age-related osteoporosis without current pathological fracture: Secondary | ICD-10-CM | POA: Diagnosis not present

## 2022-07-05 DIAGNOSIS — Z9181 History of falling: Secondary | ICD-10-CM | POA: Diagnosis not present

## 2022-07-05 DIAGNOSIS — Z9981 Dependence on supplemental oxygen: Secondary | ICD-10-CM | POA: Diagnosis not present

## 2022-07-05 DIAGNOSIS — J449 Chronic obstructive pulmonary disease, unspecified: Secondary | ICD-10-CM

## 2022-07-05 DIAGNOSIS — M0510 Rheumatoid lung disease with rheumatoid arthritis of unspecified site: Secondary | ICD-10-CM | POA: Diagnosis not present

## 2022-07-05 NOTE — Progress Notes (Signed)
Location:   [facility] Room Number: 111 Place of Service:  SNF [phone]) Provider:  Dewayne Shorter, MD  Dion Body, MD  Patient Care Team: Dion Body, MD as PCP - General (Family Medicine)  Extended Emergency Contact Information Primary Emergency [place]: [place] [address] [place] Phone: [phone] Mobile Phone: [phone] Relation: Daughter  Code Status:  FULL CODE Goals of care: Advanced Directive information    07/05/2022   11:14 AM  Advanced Directives  Does Patient Have a Medical Advance Directive? No     Chief Complaint  Patient presents with   Medical Management of Chronic Issues    Routine visit.   Quality Metric Gaps    Dexa scan   Immunizations    Shingrix vaccine, 4th COVID booster, Flu vaccine    HPI:  Pt is a 87 y.o. female seen today for a regulatory visit for chronic issues. She has had issues with elevated blood presures while working with therapy. She is tolerating the carvedilol well. She continues to have swelling in her legs despite the lasix.   Since this morning has a small painful area in the labia majora. She states it's tender to the touch. Denies dysuria and no discharge. No sign of yeast infection at this time.    Past Medical History:  Diagnosis Date   Cholelithiasis    Chronic diarrhea    Colon polyp    Diverticulosis    GERD (gastroesophageal reflux disease)    if she eats acidic foods   Hypertension    Mucoid cyst of joint    left index finger   Osteoporosis    Past Surgical History:  Procedure Laterality Date   APPENDECTOMY     BIOPSY  05/19/2019   Procedure: BIOPSY;  Surgeon: Rogene Houston, MD;  [facility];  Service: Endoscopy;;  sigmoid colon   CHOLECYSTECTOMY     COLON SURGERY     Removed large polyps   COLONOSCOPY     COLONOSCOPY N/A 05/18/2015   Procedure: COLONOSCOPY;  Surgeon: Rogene Houston, MD;   [facility];  Service: Endoscopy;  Laterality: N/A;  1030   COLONOSCOPY N/A 05/19/2019   Procedure: COLONOSCOPY;  Surgeon: Rogene Houston, MD;  [facility];  Service: Endoscopy;  Laterality: N/A;  8:30   GANGLION CYST EXCISION Left 03/2019   the fore finger   IR VERTEBROPLASTY LUMBAR BX INC UNI/BIL INC/INJECT/IMAGING  07/27/2020   MASS EXCISION Left 03/02/2019   Procedure: EXCISION CYST, DEBRIDEMENT PROXIMAL INTERPHALANGEAL JOINT LEFT INDEX FINGER;  Surgeon: Daryll Brod, MD;  Location: [facility];  Service: Orthopedics;  Laterality: Left;   RECTAL SURGERY  2000s   [facility]   TONSILLECTOMY      Allergies  Allergen Reactions   Hydrochlorothiazide Other (See Comments)    Hyponatremia (10/2021)   Nifedipine Swelling    Leg swelling  Swelling of feet with redness and elevated BP   Sulfa Antibiotics Rash, Hives and Other (See Comments)    Allergies as of 07/05/2022       Reactions   Hydrochlorothiazide Other (See Comments)   Hyponatremia (10/2021)   Nifedipine Swelling   Leg swelling Swelling of feet with redness and elevated BP   Sulfa Antibiotics Rash, Hives, Other (See Comments)        Medication List        Accurate as of July 05, 2022  5:50 PM. If you have any questions, ask your nurse or doctor.          acetaminophen 325 MG tablet Commonly known as: TYLENOL Take 650 mg by mouth every 4 (four) hours as needed.   albuterol 108 (90 Base) MCG/ACT inhaler Commonly known as: VENTOLIN HFA Inhale 2 puffs into the lungs every 6 (six) hours as needed for wheezing.   alendronate 70 MG tablet Commonly known as: FOSAMAX Take 70 mg by mouth every Sunday.   aspirin EC 81 MG tablet Take 1 tablet (81 mg total) by mouth daily. Swallow whole.   B-12 5000 MCG Caps Take 5,000 mcg by mouth daily.   carvedilol 3.125 MG tablet Commonly known as: COREG Take 3.125 mg by mouth 2 (two) times daily.   cholecalciferol 25 MCG (1000  UNIT) tablet Commonly known as: VITAMIN D3 Take 1,000 Units by mouth daily.   diclofenac sodium 1 % Gel Commonly known as: VOLTAREN Apply 2 g topically 4 (four) times daily.   furosemide 20 MG tablet Commonly known as: LASIX Take 20 mg by mouth.   hydroxychloroquine 200 MG tablet Commonly known as: PLAQUENIL Take 200 mg by mouth daily.   lisinopril 20 MG tablet Commonly known as: ZESTRIL Take 1 tablet (20 mg total) by mouth daily.   melatonin 5 MG Tabs Take 5 mg by mouth.   mycophenolate 500 MG tablet Commonly known as: CELLCEPT Take 500 mg by mouth 2 (two) times daily. Before meals   ondansetron 4 MG tablet Commonly known as: ZOFRAN Take 4 mg by mouth every 8 (eight) hours as needed for nausea or vomiting.   pantoprazole 40 MG tablet Commonly known as: PROTONIX Take 40 mg by mouth daily.   potassium chloride 10 MEQ CR capsule Commonly known as: MICRO-K Take 10 mEq by mouth daily.   PreserVision AREDS 2+Multi Vit Caps Take 1 tablet by mouth daily.   silver sulfADIAZINE 1 % cream Commonly known as: SILVADENE Apply 1 Application topically daily. Apply to left elbow area topically        Review of Systems  Constitutional:  Negative for chills, diaphoresis and fever.  Neurological:  Negative for dizziness and weakness.    Immunization History  Administered Date(s) Administered   Influenza, High Dose Seasonal PF 07/10/2018, 06/26/2021   Moderna Covid-19 Vaccine Bivalent Booster 48yr & up 06/26/2021   Moderna Sars-Covid-2 Vaccination 10/26/2019, 12/06/2019, 07/05/2020, 01/16/2021   Pneumococcal Conjugate-13 10/15/2015   Pneumococcal Polysaccharide-23 09/08/2011   Td (Adult),unspecified 08/08/2007, 09/10/2012   Tdap 08/08/2007, 09/10/2012   Zoster Recombinat (Shingrix) 07/14/2017   Pertinent  Health Maintenance Due  Topic Date Due   DEXA SCAN  Never done   INFLUENZA VACCINE  05/14/2022      06/09/2022   11:48 PM 06/10/2022    8:00 AM 06/10/2022     9:00 PM 06/11/2022   10:19 AM 06/11/2022   11:20 PM  Fall Risk  Patient Fall Risk Level Moderate fall risk High fall risk Moderate fall risk High fall risk Low fall risk   Functional Status Survey:    Vitals:   07/05/22 1056  BP: 110/70  Pulse: 70  Resp: 18  Temp: (!) 97.2 F (36.2 C)  SpO2: 94%  Weight: 107 lb 6.4 oz (48.7 kg)  Height: '5\' 3"'$  (1.6 m)   Body mass index is 19.03 kg/m. Physical Exam Cardiovascular:     Rate and Rhythm: Normal rate.  Pulmonary:     Effort: Pulmonary effort is normal.  Breath sounds: Normal breath sounds.  Abdominal:     General: Abdomen is flat. Bowel sounds are normal.     Palpations: Abdomen is soft.  Skin:    Comments: Left labia majora with nickle-sized, fluctuant mass.   Left elbow with well-healing wound.   Neurological:     Mental Status: She is alert and oriented to person, place, and time.     Labs reviewed: Recent Labs    10/25/21 0032 10/25/21 0608 04/09/22 0244 04/15/22 0938 06/08/22 2037 07/04/22 0000  NA  --  133* 136 137 138 137  K  --  3.4* 3.7 3.5 4.0 4.1  CL  --  100 103 100 103 100  CO2  --  '27 27 28 27 '$ 30*  GLUCOSE  --  88 83 102* 119*  --   BUN  --  '18 16 15 21 13  '$ CREATININE  --  0.98 1.04* 1.04* 1.15* 0.9  CALCIUM  --  8.0* 8.9 8.9 8.8* 8.5*  MG 1.6* 2.0  --   --   --   --   PHOS  --  2.6  --   --   --   --    Recent Labs    10/24/21 2102  AST 20  ALT 12  ALKPHOS 48  BILITOT 1.0  PROT 6.6  ALBUMIN 4.1   Recent Labs    10/24/21 2102 04/09/22 0244 04/15/22 0938 06/08/22 2037  WBC 9.9 6.5 7.4 8.4  NEUTROABS 7.3 4.2  --   --   HGB 14.3 13.9 14.6 13.9  HCT 41.6 43.8 45.9 42.9  MCV 91.8 99.1 98.1 98.2  PLT 252 196 236 217   No results found for: "TSH" No results found for: "HGBA1C" No results found for: "CHOL", "HDL", "LDLCALC", "LDLDIRECT", "TRIG", "CHOLHDL"  Significant Diagnostic Results in last 30 days:  US Venous Img Lower Bilateral (DVT)  Result Date: 06/21/2022 CLINICAL  DATA:  Pulmonary fibrosis. POSITIVE D-dimer. Leg swelling. EXAM: BILATERAL LOWER EXTREMITY VENOUS DOPPLER ULTRASOUND TECHNIQUE: Gray-scale sonography with graded compression, as well as color Doppler and duplex ultrasound were performed to evaluate the lower extremity deep venous systems from the level of the common femoral vein and including the common femoral, femoral, profunda femoral, popliteal and calf veins including the posterior tibial, peroneal and gastrocnemius veins when visible. The superficial great saphenous vein was also interrogated. Spectral Doppler was utilized to evaluate flow at rest and with distal augmentation maneuvers in the common femoral, femoral and popliteal veins. COMPARISON:  Chest XR, 06/09/2022. FINDINGS: RIGHT LOWER EXTREMITY VENOUS Normal compressibility of the RIGHT common femoral, superficial femoral, and popliteal veins, as well as the visualized calf veins. Visualized portions of profunda femoral vein and great saphenous vein unremarkable. No filling defects to suggest DVT on grayscale or color Doppler imaging. Doppler waveforms show normal direction of venous flow, normal respiratory plasticity and response to augmentation. OTHER No evidence of superficial thrombophlebitis or abnormal fluid collection. Distal RIGHT lower extremity subcutaneous edema. Limitations: none LEFT LOWER EXTREMITY VENOUS Normal compressibility of the LEFT common femoral, superficial femoral, and popliteal veins, as well as the visualized calf veins. Visualized portions of profunda femoral vein and great saphenous vein unremarkable. No filling defects to suggest DVT on grayscale or color Doppler imaging. Doppler waveforms show normal direction of venous flow, normal respiratory plasticity and response to augmentation. OTHER No evidence of superficial thrombophlebitis or abnormal fluid collection. Distal LEFT lower extremity subcutaneous edema. Limitations: none IMPRESSION: 1. No evidence of  femoropopliteal  DVT or superficial thrombophlebitis within either lower extremity. 2. Distal bilateral lower extremity subcutaneous edema. Michaelle Birks, MD [facility] Electronically Signed   By: Michaelle Birks M.D.   On: 06/21/2022 15:47   DG Chest Port 1 View  Result Date: 06/09/2022 CLINICAL DATA:  Syncope EXAM: PORTABLE CHEST 1 VIEW COMPARISON:  10/24/2021.  Left humerus 06/08/2022 FINDINGS: Extensive chronic lung disease/fibrosis throughout the lungs bilaterally, left greater than right. No acute confluent airspace opacities or effusions. Heart is normal size. Left humeral neck fracture noted as seen on humerus series yesterday. IMPRESSION: Extensive chronic lung disease/fibrosis. No active disease. Electronically Signed   By: Rolm Baptise M.D.   On: 06/09/2022 01:58   CT Head Wo Contrast  Result Date: 06/09/2022 CLINICAL DATA:  Mental status changes, unknown cause. EXAM: CT HEAD WITHOUT CONTRAST TECHNIQUE: Contiguous axial images were obtained from the base of the skull through the vertex without intravenous contrast. RADIATION DOSE REDUCTION: This exam was performed according to the departmental dose-optimization program which includes automated exposure control, adjustment of the mA and/or kV according to patient size and/or use of iterative reconstruction technique. COMPARISON:  MRI brain report 09/17/2017. Images unavailable in PACS at time of reading. FINDINGS: Brain: There is mild global atrophy and moderately developed small-vessel disease of the cerebral white matter. There is a chronic right thalamic lacunar infarct. No acute cortical based infarct, hemorrhage, mass or mass effect, or midline shift are seen. There is trace mineralization in the posterior basal ganglia. Vascular: The carotid siphons are moderately calcified. No hyperdense central vessel is seen. Skull: Negative for fracture or focal lesion. Sinuses/Orbits: There is mild  membrane disease in the maxillary and ethmoid sinuses without fluid level. Other sinuses are clear. Mild S shaped nasal septum. Old lens extractions. Other: Minimal fluid in both mastoid tips. Rest of the mastoids are clear. Both middle ears are clear. IMPRESSION: 1. No acute intracranial CT findings.  Chronic change. 2. Sinus membrane disease. 3. Fluid in the mastoid tips. 4. Carotid atherosclerosis. Electronically Signed   By: Telford Nab M.D.   On: 06/09/2022 00:51   DG Humerus Left  Result Date: 06/08/2022 CLINICAL DATA:  Recent fall with shoulder pain, initial encounter EXAM: LEFT HUMERUS - 2+ VIEW COMPARISON:  None Available. FINDINGS: There is a fracture involving the proximal left humerus at the level of the surgical neck. Focal avulsion involving the greater tuberosity is noted as well. Distal humerus is unremarkable. Remainder of the shoulder girdle appears within normal limits. IMPRESSION: Proximal left humeral fracture involving the surgical neck as well as the greater tuberosity. Electronically Signed   By: Inez Catalina M.D.   On: 06/08/2022 21:13    Assessment/Plan 1. Bartholin cyst Warm Compresses TID for 1 week. If not improving will perform I&D.   2. Essential hypertension BP well-controlled today. Discussed concern for prolonged treatment of pedal edema with diuretic. K 4.1 today, however, given potential impact on kidneys, blood pressure, etc. Would prefer to discontinue. Patient agreed. Will copy cardiologist as an [place]. Patient to discuss further with PCP at follow up. Continue compression and elevated feet. Continue carvedilol 3.125 BID and losartan 20 mg daily.   3. Syncope, cardiogenic No symptoms since discharge or recurrence. Feeling great and looking forward to going home and working with therapy once her BP is controlled.   4. Closed nondisplaced fracture of surgical neck of left humerus with routine healing, unspecified fracture morphology, subsequent  encounter Fracture on 8/27. Follow up  with orthopedics yesterday which stated, "The patient will continue sling and pendulum exercise every 2 weeks. After that, PT can start some active range of motion. We will see her back here in 3 weeks for exam and x-ray of the left shoulder and internal and external rotation." Will start PT at that time.   5. COPD with chronic bronchitis ([place]) Patient's O2 sats today in the 90s. Will continue   6. Stage 3 chronic kidney disease, unspecified whether stage 3a or 3b CKD (HCC) Cr. 0.87 on labs today. Remainder of RFT within normal limits. Will plan to d/c lasix a this time and continue conservative mgmt of pedal edema.   7. Raynaud's Continue mycophenolate BID and hydroxychlorquine daily  8. Pedal Edema Has been an issue since discharge. PVL done 1 week ago and was negative. Trial of lasix for one week with no improvement. Discussed importance of compression and elevation. Reassured should improve in upcoming weeks. Follow up with PCP.   Family/ staff Communication: nursing staff updated  Labs/tests ordered:   none  Tomasa Rand, MD, [facility] [phone]

## 2022-07-06 DIAGNOSIS — Z9981 Dependence on supplemental oxygen: Secondary | ICD-10-CM | POA: Diagnosis not present

## 2022-07-06 DIAGNOSIS — M6281 Muscle weakness (generalized): Secondary | ICD-10-CM | POA: Diagnosis not present

## 2022-07-06 DIAGNOSIS — M810 Age-related osteoporosis without current pathological fracture: Secondary | ICD-10-CM | POA: Diagnosis not present

## 2022-07-06 DIAGNOSIS — J8410 Pulmonary fibrosis, unspecified: Secondary | ICD-10-CM | POA: Diagnosis not present

## 2022-07-06 DIAGNOSIS — M48061 Spinal stenosis, lumbar region without neurogenic claudication: Secondary | ICD-10-CM | POA: Diagnosis not present

## 2022-07-06 DIAGNOSIS — Z9181 History of falling: Secondary | ICD-10-CM | POA: Diagnosis not present

## 2022-07-06 DIAGNOSIS — R278 Other lack of coordination: Secondary | ICD-10-CM | POA: Diagnosis not present

## 2022-07-06 DIAGNOSIS — R55 Syncope and collapse: Secondary | ICD-10-CM | POA: Diagnosis not present

## 2022-07-06 DIAGNOSIS — M0510 Rheumatoid lung disease with rheumatoid arthritis of unspecified site: Secondary | ICD-10-CM | POA: Diagnosis not present

## 2022-07-06 DIAGNOSIS — M6259 Muscle wasting and atrophy, not elsewhere classified, multiple sites: Secondary | ICD-10-CM | POA: Diagnosis not present

## 2022-07-06 DIAGNOSIS — S42215D Unspecified nondisplaced fracture of surgical neck of left humerus, subsequent encounter for fracture with routine healing: Secondary | ICD-10-CM | POA: Diagnosis not present

## 2022-07-06 DIAGNOSIS — I7300 Raynaud's syndrome without gangrene: Secondary | ICD-10-CM | POA: Diagnosis not present

## 2022-07-06 DIAGNOSIS — Z741 Need for assistance with personal care: Secondary | ICD-10-CM | POA: Diagnosis not present

## 2022-07-08 DIAGNOSIS — I7300 Raynaud's syndrome without gangrene: Secondary | ICD-10-CM | POA: Diagnosis not present

## 2022-07-08 DIAGNOSIS — M6281 Muscle weakness (generalized): Secondary | ICD-10-CM | POA: Diagnosis not present

## 2022-07-08 DIAGNOSIS — R55 Syncope and collapse: Secondary | ICD-10-CM | POA: Diagnosis not present

## 2022-07-08 DIAGNOSIS — M810 Age-related osteoporosis without current pathological fracture: Secondary | ICD-10-CM | POA: Diagnosis not present

## 2022-07-08 DIAGNOSIS — M48061 Spinal stenosis, lumbar region without neurogenic claudication: Secondary | ICD-10-CM | POA: Diagnosis not present

## 2022-07-08 DIAGNOSIS — J8410 Pulmonary fibrosis, unspecified: Secondary | ICD-10-CM | POA: Diagnosis not present

## 2022-07-08 DIAGNOSIS — Z741 Need for assistance with personal care: Secondary | ICD-10-CM | POA: Diagnosis not present

## 2022-07-08 DIAGNOSIS — Z9981 Dependence on supplemental oxygen: Secondary | ICD-10-CM | POA: Diagnosis not present

## 2022-07-08 DIAGNOSIS — M6259 Muscle wasting and atrophy, not elsewhere classified, multiple sites: Secondary | ICD-10-CM | POA: Diagnosis not present

## 2022-07-08 DIAGNOSIS — S42215D Unspecified nondisplaced fracture of surgical neck of left humerus, subsequent encounter for fracture with routine healing: Secondary | ICD-10-CM | POA: Diagnosis not present

## 2022-07-08 DIAGNOSIS — R278 Other lack of coordination: Secondary | ICD-10-CM | POA: Diagnosis not present

## 2022-07-08 DIAGNOSIS — Z9181 History of falling: Secondary | ICD-10-CM | POA: Diagnosis not present

## 2022-07-08 DIAGNOSIS — M0510 Rheumatoid lung disease with rheumatoid arthritis of unspecified site: Secondary | ICD-10-CM | POA: Diagnosis not present

## 2022-07-09 DIAGNOSIS — M48061 Spinal stenosis, lumbar region without neurogenic claudication: Secondary | ICD-10-CM | POA: Diagnosis not present

## 2022-07-09 DIAGNOSIS — I7300 Raynaud's syndrome without gangrene: Secondary | ICD-10-CM | POA: Diagnosis not present

## 2022-07-09 DIAGNOSIS — M810 Age-related osteoporosis without current pathological fracture: Secondary | ICD-10-CM | POA: Diagnosis not present

## 2022-07-09 DIAGNOSIS — R278 Other lack of coordination: Secondary | ICD-10-CM | POA: Diagnosis not present

## 2022-07-09 DIAGNOSIS — R55 Syncope and collapse: Secondary | ICD-10-CM | POA: Diagnosis not present

## 2022-07-09 DIAGNOSIS — M6259 Muscle wasting and atrophy, not elsewhere classified, multiple sites: Secondary | ICD-10-CM | POA: Diagnosis not present

## 2022-07-09 DIAGNOSIS — S42215D Unspecified nondisplaced fracture of surgical neck of left humerus, subsequent encounter for fracture with routine healing: Secondary | ICD-10-CM | POA: Diagnosis not present

## 2022-07-09 DIAGNOSIS — Z741 Need for assistance with personal care: Secondary | ICD-10-CM | POA: Diagnosis not present

## 2022-07-09 DIAGNOSIS — M6281 Muscle weakness (generalized): Secondary | ICD-10-CM | POA: Diagnosis not present

## 2022-07-09 DIAGNOSIS — Z9181 History of falling: Secondary | ICD-10-CM | POA: Diagnosis not present

## 2022-07-09 DIAGNOSIS — Z9981 Dependence on supplemental oxygen: Secondary | ICD-10-CM | POA: Diagnosis not present

## 2022-07-09 DIAGNOSIS — J8410 Pulmonary fibrosis, unspecified: Secondary | ICD-10-CM | POA: Diagnosis not present

## 2022-07-09 DIAGNOSIS — M0510 Rheumatoid lung disease with rheumatoid arthritis of unspecified site: Secondary | ICD-10-CM | POA: Diagnosis not present

## 2022-07-10 DIAGNOSIS — S42215D Unspecified nondisplaced fracture of surgical neck of left humerus, subsequent encounter for fracture with routine healing: Secondary | ICD-10-CM | POA: Diagnosis not present

## 2022-07-10 DIAGNOSIS — Z9181 History of falling: Secondary | ICD-10-CM | POA: Diagnosis not present

## 2022-07-10 DIAGNOSIS — M6281 Muscle weakness (generalized): Secondary | ICD-10-CM | POA: Diagnosis not present

## 2022-07-10 DIAGNOSIS — Z9981 Dependence on supplemental oxygen: Secondary | ICD-10-CM | POA: Diagnosis not present

## 2022-07-10 DIAGNOSIS — R278 Other lack of coordination: Secondary | ICD-10-CM | POA: Diagnosis not present

## 2022-07-10 DIAGNOSIS — Z741 Need for assistance with personal care: Secondary | ICD-10-CM | POA: Diagnosis not present

## 2022-07-10 DIAGNOSIS — I7300 Raynaud's syndrome without gangrene: Secondary | ICD-10-CM | POA: Diagnosis not present

## 2022-07-10 DIAGNOSIS — M810 Age-related osteoporosis without current pathological fracture: Secondary | ICD-10-CM | POA: Diagnosis not present

## 2022-07-10 DIAGNOSIS — M48061 Spinal stenosis, lumbar region without neurogenic claudication: Secondary | ICD-10-CM | POA: Diagnosis not present

## 2022-07-10 DIAGNOSIS — R55 Syncope and collapse: Secondary | ICD-10-CM | POA: Diagnosis not present

## 2022-07-10 DIAGNOSIS — J42 Unspecified chronic bronchitis: Secondary | ICD-10-CM | POA: Diagnosis not present

## 2022-07-10 DIAGNOSIS — M0510 Rheumatoid lung disease with rheumatoid arthritis of unspecified site: Secondary | ICD-10-CM | POA: Diagnosis not present

## 2022-07-10 DIAGNOSIS — J8410 Pulmonary fibrosis, unspecified: Secondary | ICD-10-CM | POA: Diagnosis not present

## 2022-07-10 DIAGNOSIS — M6259 Muscle wasting and atrophy, not elsewhere classified, multiple sites: Secondary | ICD-10-CM | POA: Diagnosis not present

## 2022-07-12 DIAGNOSIS — Z741 Need for assistance with personal care: Secondary | ICD-10-CM | POA: Diagnosis not present

## 2022-07-12 DIAGNOSIS — I7300 Raynaud's syndrome without gangrene: Secondary | ICD-10-CM | POA: Diagnosis not present

## 2022-07-12 DIAGNOSIS — M069 Rheumatoid arthritis, unspecified: Secondary | ICD-10-CM | POA: Diagnosis not present

## 2022-07-12 DIAGNOSIS — M0510 Rheumatoid lung disease with rheumatoid arthritis of unspecified site: Secondary | ICD-10-CM | POA: Diagnosis not present

## 2022-07-12 DIAGNOSIS — S42215D Unspecified nondisplaced fracture of surgical neck of left humerus, subsequent encounter for fracture with routine healing: Secondary | ICD-10-CM | POA: Diagnosis not present

## 2022-07-12 DIAGNOSIS — R262 Difficulty in walking, not elsewhere classified: Secondary | ICD-10-CM | POA: Diagnosis not present

## 2022-07-12 DIAGNOSIS — M6281 Muscle weakness (generalized): Secondary | ICD-10-CM | POA: Diagnosis not present

## 2022-07-12 DIAGNOSIS — J8410 Pulmonary fibrosis, unspecified: Secondary | ICD-10-CM | POA: Diagnosis not present

## 2022-07-12 DIAGNOSIS — Z9981 Dependence on supplemental oxygen: Secondary | ICD-10-CM | POA: Diagnosis not present

## 2022-07-12 DIAGNOSIS — R55 Syncope and collapse: Secondary | ICD-10-CM | POA: Diagnosis not present

## 2022-07-12 DIAGNOSIS — R278 Other lack of coordination: Secondary | ICD-10-CM | POA: Diagnosis not present

## 2022-07-15 DIAGNOSIS — M069 Rheumatoid arthritis, unspecified: Secondary | ICD-10-CM | POA: Diagnosis not present

## 2022-07-15 DIAGNOSIS — M6281 Muscle weakness (generalized): Secondary | ICD-10-CM | POA: Diagnosis not present

## 2022-07-15 DIAGNOSIS — I7300 Raynaud's syndrome without gangrene: Secondary | ICD-10-CM | POA: Diagnosis not present

## 2022-07-15 DIAGNOSIS — M0510 Rheumatoid lung disease with rheumatoid arthritis of unspecified site: Secondary | ICD-10-CM | POA: Diagnosis not present

## 2022-07-15 DIAGNOSIS — R262 Difficulty in walking, not elsewhere classified: Secondary | ICD-10-CM | POA: Diagnosis not present

## 2022-07-15 DIAGNOSIS — Z741 Need for assistance with personal care: Secondary | ICD-10-CM | POA: Diagnosis not present

## 2022-07-15 DIAGNOSIS — S42215D Unspecified nondisplaced fracture of surgical neck of left humerus, subsequent encounter for fracture with routine healing: Secondary | ICD-10-CM | POA: Diagnosis not present

## 2022-07-15 DIAGNOSIS — J8410 Pulmonary fibrosis, unspecified: Secondary | ICD-10-CM | POA: Diagnosis not present

## 2022-07-15 DIAGNOSIS — R278 Other lack of coordination: Secondary | ICD-10-CM | POA: Diagnosis not present

## 2022-07-15 DIAGNOSIS — R55 Syncope and collapse: Secondary | ICD-10-CM | POA: Diagnosis not present

## 2022-07-15 DIAGNOSIS — Z9981 Dependence on supplemental oxygen: Secondary | ICD-10-CM | POA: Diagnosis not present

## 2022-07-17 ENCOUNTER — Inpatient Hospital Stay: Admission: RE | Admit: 2022-07-17 | Payer: Medicare Other | Source: Ambulatory Visit

## 2022-07-17 DIAGNOSIS — M069 Rheumatoid arthritis, unspecified: Secondary | ICD-10-CM | POA: Diagnosis not present

## 2022-07-17 DIAGNOSIS — R262 Difficulty in walking, not elsewhere classified: Secondary | ICD-10-CM | POA: Diagnosis not present

## 2022-07-17 DIAGNOSIS — I7300 Raynaud's syndrome without gangrene: Secondary | ICD-10-CM | POA: Diagnosis not present

## 2022-07-17 DIAGNOSIS — R55 Syncope and collapse: Secondary | ICD-10-CM | POA: Diagnosis not present

## 2022-07-17 DIAGNOSIS — M6281 Muscle weakness (generalized): Secondary | ICD-10-CM | POA: Diagnosis not present

## 2022-07-17 DIAGNOSIS — Z9981 Dependence on supplemental oxygen: Secondary | ICD-10-CM | POA: Diagnosis not present

## 2022-07-17 DIAGNOSIS — Z741 Need for assistance with personal care: Secondary | ICD-10-CM | POA: Diagnosis not present

## 2022-07-17 DIAGNOSIS — J8410 Pulmonary fibrosis, unspecified: Secondary | ICD-10-CM | POA: Diagnosis not present

## 2022-07-17 DIAGNOSIS — M0510 Rheumatoid lung disease with rheumatoid arthritis of unspecified site: Secondary | ICD-10-CM | POA: Diagnosis not present

## 2022-07-17 DIAGNOSIS — R278 Other lack of coordination: Secondary | ICD-10-CM | POA: Diagnosis not present

## 2022-07-17 DIAGNOSIS — S42215D Unspecified nondisplaced fracture of surgical neck of left humerus, subsequent encounter for fracture with routine healing: Secondary | ICD-10-CM | POA: Diagnosis not present

## 2022-07-18 DIAGNOSIS — M069 Rheumatoid arthritis, unspecified: Secondary | ICD-10-CM | POA: Diagnosis not present

## 2022-07-18 DIAGNOSIS — I7300 Raynaud's syndrome without gangrene: Secondary | ICD-10-CM | POA: Diagnosis not present

## 2022-07-18 DIAGNOSIS — R262 Difficulty in walking, not elsewhere classified: Secondary | ICD-10-CM | POA: Diagnosis not present

## 2022-07-18 DIAGNOSIS — M6281 Muscle weakness (generalized): Secondary | ICD-10-CM | POA: Diagnosis not present

## 2022-07-18 DIAGNOSIS — M0510 Rheumatoid lung disease with rheumatoid arthritis of unspecified site: Secondary | ICD-10-CM | POA: Diagnosis not present

## 2022-07-18 DIAGNOSIS — R278 Other lack of coordination: Secondary | ICD-10-CM | POA: Diagnosis not present

## 2022-07-18 DIAGNOSIS — Z9981 Dependence on supplemental oxygen: Secondary | ICD-10-CM | POA: Diagnosis not present

## 2022-07-18 DIAGNOSIS — J8410 Pulmonary fibrosis, unspecified: Secondary | ICD-10-CM | POA: Diagnosis not present

## 2022-07-18 DIAGNOSIS — R55 Syncope and collapse: Secondary | ICD-10-CM | POA: Diagnosis not present

## 2022-07-18 DIAGNOSIS — Z741 Need for assistance with personal care: Secondary | ICD-10-CM | POA: Diagnosis not present

## 2022-07-18 DIAGNOSIS — S42215D Unspecified nondisplaced fracture of surgical neck of left humerus, subsequent encounter for fracture with routine healing: Secondary | ICD-10-CM | POA: Diagnosis not present

## 2022-07-19 DIAGNOSIS — M6281 Muscle weakness (generalized): Secondary | ICD-10-CM | POA: Diagnosis not present

## 2022-07-19 DIAGNOSIS — M069 Rheumatoid arthritis, unspecified: Secondary | ICD-10-CM | POA: Diagnosis not present

## 2022-07-19 DIAGNOSIS — I7300 Raynaud's syndrome without gangrene: Secondary | ICD-10-CM | POA: Diagnosis not present

## 2022-07-19 DIAGNOSIS — R278 Other lack of coordination: Secondary | ICD-10-CM | POA: Diagnosis not present

## 2022-07-19 DIAGNOSIS — R262 Difficulty in walking, not elsewhere classified: Secondary | ICD-10-CM | POA: Diagnosis not present

## 2022-07-19 DIAGNOSIS — Z741 Need for assistance with personal care: Secondary | ICD-10-CM | POA: Diagnosis not present

## 2022-07-19 DIAGNOSIS — R55 Syncope and collapse: Secondary | ICD-10-CM | POA: Diagnosis not present

## 2022-07-19 DIAGNOSIS — J8410 Pulmonary fibrosis, unspecified: Secondary | ICD-10-CM | POA: Diagnosis not present

## 2022-07-19 DIAGNOSIS — S42215D Unspecified nondisplaced fracture of surgical neck of left humerus, subsequent encounter for fracture with routine healing: Secondary | ICD-10-CM | POA: Diagnosis not present

## 2022-07-19 DIAGNOSIS — M0510 Rheumatoid lung disease with rheumatoid arthritis of unspecified site: Secondary | ICD-10-CM | POA: Diagnosis not present

## 2022-07-19 DIAGNOSIS — Z9981 Dependence on supplemental oxygen: Secondary | ICD-10-CM | POA: Diagnosis not present

## 2022-07-22 DIAGNOSIS — Z9981 Dependence on supplemental oxygen: Secondary | ICD-10-CM | POA: Diagnosis not present

## 2022-07-22 DIAGNOSIS — M0510 Rheumatoid lung disease with rheumatoid arthritis of unspecified site: Secondary | ICD-10-CM | POA: Diagnosis not present

## 2022-07-22 DIAGNOSIS — M6281 Muscle weakness (generalized): Secondary | ICD-10-CM | POA: Diagnosis not present

## 2022-07-22 DIAGNOSIS — R278 Other lack of coordination: Secondary | ICD-10-CM | POA: Diagnosis not present

## 2022-07-22 DIAGNOSIS — R600 Localized edema: Secondary | ICD-10-CM | POA: Diagnosis not present

## 2022-07-22 DIAGNOSIS — I7300 Raynaud's syndrome without gangrene: Secondary | ICD-10-CM | POA: Diagnosis not present

## 2022-07-22 DIAGNOSIS — Z87898 Personal history of other specified conditions: Secondary | ICD-10-CM | POA: Diagnosis not present

## 2022-07-22 DIAGNOSIS — I10 Essential (primary) hypertension: Secondary | ICD-10-CM | POA: Diagnosis not present

## 2022-07-22 DIAGNOSIS — R262 Difficulty in walking, not elsewhere classified: Secondary | ICD-10-CM | POA: Diagnosis not present

## 2022-07-22 DIAGNOSIS — R55 Syncope and collapse: Secondary | ICD-10-CM | POA: Diagnosis not present

## 2022-07-22 DIAGNOSIS — Z741 Need for assistance with personal care: Secondary | ICD-10-CM | POA: Diagnosis not present

## 2022-07-22 DIAGNOSIS — J8410 Pulmonary fibrosis, unspecified: Secondary | ICD-10-CM | POA: Diagnosis not present

## 2022-07-22 DIAGNOSIS — M069 Rheumatoid arthritis, unspecified: Secondary | ICD-10-CM | POA: Diagnosis not present

## 2022-07-22 DIAGNOSIS — S42215D Unspecified nondisplaced fracture of surgical neck of left humerus, subsequent encounter for fracture with routine healing: Secondary | ICD-10-CM | POA: Diagnosis not present

## 2022-07-24 DIAGNOSIS — M6281 Muscle weakness (generalized): Secondary | ICD-10-CM | POA: Diagnosis not present

## 2022-07-24 DIAGNOSIS — R55 Syncope and collapse: Secondary | ICD-10-CM | POA: Diagnosis not present

## 2022-07-24 DIAGNOSIS — J8410 Pulmonary fibrosis, unspecified: Secondary | ICD-10-CM | POA: Diagnosis not present

## 2022-07-24 DIAGNOSIS — Z9981 Dependence on supplemental oxygen: Secondary | ICD-10-CM | POA: Diagnosis not present

## 2022-07-24 DIAGNOSIS — M0510 Rheumatoid lung disease with rheumatoid arthritis of unspecified site: Secondary | ICD-10-CM | POA: Diagnosis not present

## 2022-07-24 DIAGNOSIS — I7300 Raynaud's syndrome without gangrene: Secondary | ICD-10-CM | POA: Diagnosis not present

## 2022-07-24 DIAGNOSIS — Z741 Need for assistance with personal care: Secondary | ICD-10-CM | POA: Diagnosis not present

## 2022-07-24 DIAGNOSIS — M069 Rheumatoid arthritis, unspecified: Secondary | ICD-10-CM | POA: Diagnosis not present

## 2022-07-24 DIAGNOSIS — R262 Difficulty in walking, not elsewhere classified: Secondary | ICD-10-CM | POA: Diagnosis not present

## 2022-07-24 DIAGNOSIS — R278 Other lack of coordination: Secondary | ICD-10-CM | POA: Diagnosis not present

## 2022-07-24 DIAGNOSIS — S42215D Unspecified nondisplaced fracture of surgical neck of left humerus, subsequent encounter for fracture with routine healing: Secondary | ICD-10-CM | POA: Diagnosis not present

## 2022-07-25 DIAGNOSIS — S42212A Unspecified displaced fracture of surgical neck of left humerus, initial encounter for closed fracture: Secondary | ICD-10-CM | POA: Diagnosis not present

## 2022-07-26 DIAGNOSIS — Z741 Need for assistance with personal care: Secondary | ICD-10-CM | POA: Diagnosis not present

## 2022-07-26 DIAGNOSIS — M069 Rheumatoid arthritis, unspecified: Secondary | ICD-10-CM | POA: Diagnosis not present

## 2022-07-26 DIAGNOSIS — Z9981 Dependence on supplemental oxygen: Secondary | ICD-10-CM | POA: Diagnosis not present

## 2022-07-26 DIAGNOSIS — R262 Difficulty in walking, not elsewhere classified: Secondary | ICD-10-CM | POA: Diagnosis not present

## 2022-07-26 DIAGNOSIS — S42215D Unspecified nondisplaced fracture of surgical neck of left humerus, subsequent encounter for fracture with routine healing: Secondary | ICD-10-CM | POA: Diagnosis not present

## 2022-07-26 DIAGNOSIS — R278 Other lack of coordination: Secondary | ICD-10-CM | POA: Diagnosis not present

## 2022-07-26 DIAGNOSIS — I7300 Raynaud's syndrome without gangrene: Secondary | ICD-10-CM | POA: Diagnosis not present

## 2022-07-26 DIAGNOSIS — R55 Syncope and collapse: Secondary | ICD-10-CM | POA: Diagnosis not present

## 2022-07-26 DIAGNOSIS — M6281 Muscle weakness (generalized): Secondary | ICD-10-CM | POA: Diagnosis not present

## 2022-07-26 DIAGNOSIS — M0510 Rheumatoid lung disease with rheumatoid arthritis of unspecified site: Secondary | ICD-10-CM | POA: Diagnosis not present

## 2022-07-26 DIAGNOSIS — J8410 Pulmonary fibrosis, unspecified: Secondary | ICD-10-CM | POA: Diagnosis not present

## 2022-07-29 DIAGNOSIS — R262 Difficulty in walking, not elsewhere classified: Secondary | ICD-10-CM | POA: Diagnosis not present

## 2022-07-29 DIAGNOSIS — R55 Syncope and collapse: Secondary | ICD-10-CM | POA: Diagnosis not present

## 2022-07-29 DIAGNOSIS — I7300 Raynaud's syndrome without gangrene: Secondary | ICD-10-CM | POA: Diagnosis not present

## 2022-07-29 DIAGNOSIS — Z9981 Dependence on supplemental oxygen: Secondary | ICD-10-CM | POA: Diagnosis not present

## 2022-07-29 DIAGNOSIS — M069 Rheumatoid arthritis, unspecified: Secondary | ICD-10-CM | POA: Diagnosis not present

## 2022-07-29 DIAGNOSIS — Z741 Need for assistance with personal care: Secondary | ICD-10-CM | POA: Diagnosis not present

## 2022-07-29 DIAGNOSIS — M6281 Muscle weakness (generalized): Secondary | ICD-10-CM | POA: Diagnosis not present

## 2022-07-29 DIAGNOSIS — R278 Other lack of coordination: Secondary | ICD-10-CM | POA: Diagnosis not present

## 2022-07-29 DIAGNOSIS — M0510 Rheumatoid lung disease with rheumatoid arthritis of unspecified site: Secondary | ICD-10-CM | POA: Diagnosis not present

## 2022-07-29 DIAGNOSIS — S42215D Unspecified nondisplaced fracture of surgical neck of left humerus, subsequent encounter for fracture with routine healing: Secondary | ICD-10-CM | POA: Diagnosis not present

## 2022-07-29 DIAGNOSIS — J8410 Pulmonary fibrosis, unspecified: Secondary | ICD-10-CM | POA: Diagnosis not present

## 2022-07-30 DIAGNOSIS — Z1389 Encounter for screening for other disorder: Secondary | ICD-10-CM | POA: Diagnosis not present

## 2022-07-30 DIAGNOSIS — Z0000 Encounter for general adult medical examination without abnormal findings: Secondary | ICD-10-CM | POA: Diagnosis not present

## 2022-07-30 DIAGNOSIS — I10 Essential (primary) hypertension: Secondary | ICD-10-CM | POA: Diagnosis not present

## 2022-07-30 DIAGNOSIS — N1831 Chronic kidney disease, stage 3a: Secondary | ICD-10-CM | POA: Diagnosis not present

## 2022-07-30 DIAGNOSIS — Z136 Encounter for screening for cardiovascular disorders: Secondary | ICD-10-CM | POA: Diagnosis not present

## 2022-07-31 DIAGNOSIS — R278 Other lack of coordination: Secondary | ICD-10-CM | POA: Diagnosis not present

## 2022-07-31 DIAGNOSIS — I7300 Raynaud's syndrome without gangrene: Secondary | ICD-10-CM | POA: Diagnosis not present

## 2022-07-31 DIAGNOSIS — Z741 Need for assistance with personal care: Secondary | ICD-10-CM | POA: Diagnosis not present

## 2022-07-31 DIAGNOSIS — J8410 Pulmonary fibrosis, unspecified: Secondary | ICD-10-CM | POA: Diagnosis not present

## 2022-07-31 DIAGNOSIS — R55 Syncope and collapse: Secondary | ICD-10-CM | POA: Diagnosis not present

## 2022-07-31 DIAGNOSIS — S42215D Unspecified nondisplaced fracture of surgical neck of left humerus, subsequent encounter for fracture with routine healing: Secondary | ICD-10-CM | POA: Diagnosis not present

## 2022-07-31 DIAGNOSIS — Z9981 Dependence on supplemental oxygen: Secondary | ICD-10-CM | POA: Diagnosis not present

## 2022-07-31 DIAGNOSIS — M0510 Rheumatoid lung disease with rheumatoid arthritis of unspecified site: Secondary | ICD-10-CM | POA: Diagnosis not present

## 2022-07-31 DIAGNOSIS — R262 Difficulty in walking, not elsewhere classified: Secondary | ICD-10-CM | POA: Diagnosis not present

## 2022-07-31 DIAGNOSIS — M069 Rheumatoid arthritis, unspecified: Secondary | ICD-10-CM | POA: Diagnosis not present

## 2022-07-31 DIAGNOSIS — M6281 Muscle weakness (generalized): Secondary | ICD-10-CM | POA: Diagnosis not present

## 2022-08-02 DIAGNOSIS — Z741 Need for assistance with personal care: Secondary | ICD-10-CM | POA: Diagnosis not present

## 2022-08-02 DIAGNOSIS — R55 Syncope and collapse: Secondary | ICD-10-CM | POA: Diagnosis not present

## 2022-08-02 DIAGNOSIS — S42215D Unspecified nondisplaced fracture of surgical neck of left humerus, subsequent encounter for fracture with routine healing: Secondary | ICD-10-CM | POA: Diagnosis not present

## 2022-08-02 DIAGNOSIS — I7300 Raynaud's syndrome without gangrene: Secondary | ICD-10-CM | POA: Diagnosis not present

## 2022-08-02 DIAGNOSIS — M6281 Muscle weakness (generalized): Secondary | ICD-10-CM | POA: Diagnosis not present

## 2022-08-02 DIAGNOSIS — R262 Difficulty in walking, not elsewhere classified: Secondary | ICD-10-CM | POA: Diagnosis not present

## 2022-08-02 DIAGNOSIS — M0510 Rheumatoid lung disease with rheumatoid arthritis of unspecified site: Secondary | ICD-10-CM | POA: Diagnosis not present

## 2022-08-02 DIAGNOSIS — J8410 Pulmonary fibrosis, unspecified: Secondary | ICD-10-CM | POA: Diagnosis not present

## 2022-08-02 DIAGNOSIS — R278 Other lack of coordination: Secondary | ICD-10-CM | POA: Diagnosis not present

## 2022-08-02 DIAGNOSIS — Z9981 Dependence on supplemental oxygen: Secondary | ICD-10-CM | POA: Diagnosis not present

## 2022-08-02 DIAGNOSIS — M069 Rheumatoid arthritis, unspecified: Secondary | ICD-10-CM | POA: Diagnosis not present

## 2022-08-05 DIAGNOSIS — Z9981 Dependence on supplemental oxygen: Secondary | ICD-10-CM | POA: Diagnosis not present

## 2022-08-05 DIAGNOSIS — M069 Rheumatoid arthritis, unspecified: Secondary | ICD-10-CM | POA: Diagnosis not present

## 2022-08-05 DIAGNOSIS — R55 Syncope and collapse: Secondary | ICD-10-CM | POA: Diagnosis not present

## 2022-08-05 DIAGNOSIS — R278 Other lack of coordination: Secondary | ICD-10-CM | POA: Diagnosis not present

## 2022-08-05 DIAGNOSIS — M6281 Muscle weakness (generalized): Secondary | ICD-10-CM | POA: Diagnosis not present

## 2022-08-05 DIAGNOSIS — J8410 Pulmonary fibrosis, unspecified: Secondary | ICD-10-CM | POA: Diagnosis not present

## 2022-08-05 DIAGNOSIS — R262 Difficulty in walking, not elsewhere classified: Secondary | ICD-10-CM | POA: Diagnosis not present

## 2022-08-05 DIAGNOSIS — M0510 Rheumatoid lung disease with rheumatoid arthritis of unspecified site: Secondary | ICD-10-CM | POA: Diagnosis not present

## 2022-08-05 DIAGNOSIS — Z741 Need for assistance with personal care: Secondary | ICD-10-CM | POA: Diagnosis not present

## 2022-08-05 DIAGNOSIS — I7300 Raynaud's syndrome without gangrene: Secondary | ICD-10-CM | POA: Diagnosis not present

## 2022-08-05 DIAGNOSIS — S42215D Unspecified nondisplaced fracture of surgical neck of left humerus, subsequent encounter for fracture with routine healing: Secondary | ICD-10-CM | POA: Diagnosis not present

## 2022-08-07 ENCOUNTER — Ambulatory Visit
Admission: RE | Admit: 2022-08-07 | Discharge: 2022-08-07 | Disposition: A | Discharge status: Home / Self Care | Payer: Medicare Other | Source: Ambulatory Visit | Attending: Pulmonary Disease | Admitting: Pulmonary Disease

## 2022-08-07 ENCOUNTER — Encounter
Admission: RE | Admit: 2022-08-07 | Discharge: 2022-08-07 | Disposition: A | Discharge status: Home / Self Care | Payer: Medicare Other | Source: Ambulatory Visit | Attending: Pulmonary Disease | Admitting: Pulmonary Disease

## 2022-08-07 DIAGNOSIS — J8410 Pulmonary fibrosis, unspecified: Secondary | ICD-10-CM

## 2022-08-07 DIAGNOSIS — Z9981 Dependence on supplemental oxygen: Secondary | ICD-10-CM | POA: Diagnosis not present

## 2022-08-07 DIAGNOSIS — S42215D Unspecified nondisplaced fracture of surgical neck of left humerus, subsequent encounter for fracture with routine healing: Secondary | ICD-10-CM | POA: Diagnosis not present

## 2022-08-07 DIAGNOSIS — R55 Syncope and collapse: Secondary | ICD-10-CM | POA: Diagnosis not present

## 2022-08-07 DIAGNOSIS — J849 Interstitial pulmonary disease, unspecified: Secondary | ICD-10-CM | POA: Diagnosis not present

## 2022-08-07 DIAGNOSIS — R278 Other lack of coordination: Secondary | ICD-10-CM | POA: Diagnosis not present

## 2022-08-07 DIAGNOSIS — M6281 Muscle weakness (generalized): Secondary | ICD-10-CM | POA: Diagnosis not present

## 2022-08-07 DIAGNOSIS — I7300 Raynaud's syndrome without gangrene: Secondary | ICD-10-CM | POA: Diagnosis not present

## 2022-08-07 DIAGNOSIS — R262 Difficulty in walking, not elsewhere classified: Secondary | ICD-10-CM | POA: Diagnosis not present

## 2022-08-07 DIAGNOSIS — R918 Other nonspecific abnormal finding of lung field: Secondary | ICD-10-CM | POA: Diagnosis not present

## 2022-08-07 DIAGNOSIS — I700 Atherosclerosis of aorta: Secondary | ICD-10-CM | POA: Diagnosis not present

## 2022-08-07 DIAGNOSIS — M0510 Rheumatoid lung disease with rheumatoid arthritis of unspecified site: Secondary | ICD-10-CM | POA: Diagnosis not present

## 2022-08-07 DIAGNOSIS — Z741 Need for assistance with personal care: Secondary | ICD-10-CM | POA: Diagnosis not present

## 2022-08-07 DIAGNOSIS — M069 Rheumatoid arthritis, unspecified: Secondary | ICD-10-CM | POA: Diagnosis not present

## 2022-08-07 MED ORDER — TECHNETIUM TO 99M ALBUMIN AGGREGATED
4.0000 | Freq: Once | INTRAVENOUS | Status: AC | PRN
  Administered 2022-08-07: 4.07 via INTRAVENOUS

## 2022-08-09 DIAGNOSIS — Z741 Need for assistance with personal care: Secondary | ICD-10-CM | POA: Diagnosis not present

## 2022-08-09 DIAGNOSIS — S42215D Unspecified nondisplaced fracture of surgical neck of left humerus, subsequent encounter for fracture with routine healing: Secondary | ICD-10-CM | POA: Diagnosis not present

## 2022-08-09 DIAGNOSIS — R55 Syncope and collapse: Secondary | ICD-10-CM | POA: Diagnosis not present

## 2022-08-09 DIAGNOSIS — J42 Unspecified chronic bronchitis: Secondary | ICD-10-CM | POA: Diagnosis not present

## 2022-08-09 DIAGNOSIS — R262 Difficulty in walking, not elsewhere classified: Secondary | ICD-10-CM | POA: Diagnosis not present

## 2022-08-09 DIAGNOSIS — J8410 Pulmonary fibrosis, unspecified: Secondary | ICD-10-CM | POA: Diagnosis not present

## 2022-08-09 DIAGNOSIS — M069 Rheumatoid arthritis, unspecified: Secondary | ICD-10-CM | POA: Diagnosis not present

## 2022-08-09 DIAGNOSIS — M0510 Rheumatoid lung disease with rheumatoid arthritis of unspecified site: Secondary | ICD-10-CM | POA: Diagnosis not present

## 2022-08-09 DIAGNOSIS — Z9981 Dependence on supplemental oxygen: Secondary | ICD-10-CM | POA: Diagnosis not present

## 2022-08-09 DIAGNOSIS — R278 Other lack of coordination: Secondary | ICD-10-CM | POA: Diagnosis not present

## 2022-08-09 DIAGNOSIS — I7300 Raynaud's syndrome without gangrene: Secondary | ICD-10-CM | POA: Diagnosis not present

## 2022-08-09 DIAGNOSIS — M6281 Muscle weakness (generalized): Secondary | ICD-10-CM | POA: Diagnosis not present

## 2022-08-11 IMAGING — CT CT CHEST W/O CM
2 of 3 series · 15 of 36 positions shown, 18 images · non-contrast
Comparison: May 27, 2021.

CLINICAL DATA: Cough, COPD exacerbation.

EXAM:
CT CHEST WITHOUT CONTRAST
TECHNIQUE: Multidetector CT imaging of the chest was performed following the
standard protocol without IV contrast.

[Series 2: thorax · axial · 0.56mm/px · z∈[-655,-385]mm · 12 of 159 slices shown, 15 images]
[im 12/159  mediastinal]
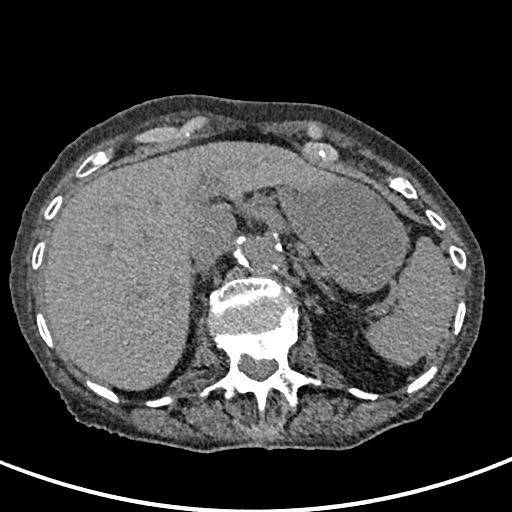
[im 12/159  lung]
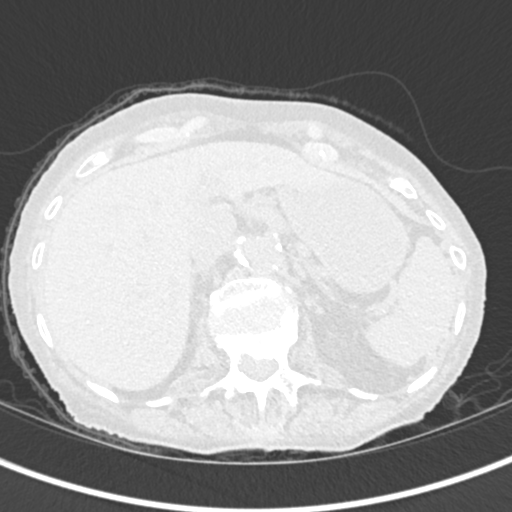
[im 24/159  lung]
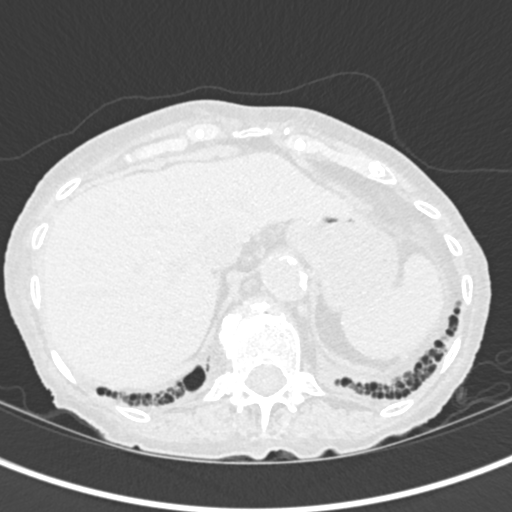
[im 36/159  lung]
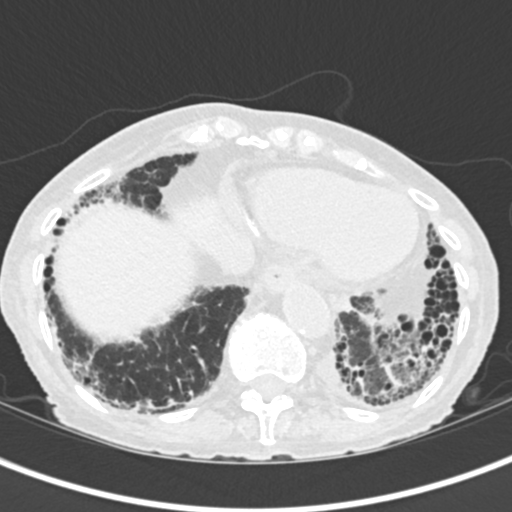
[im 47/159  lung]
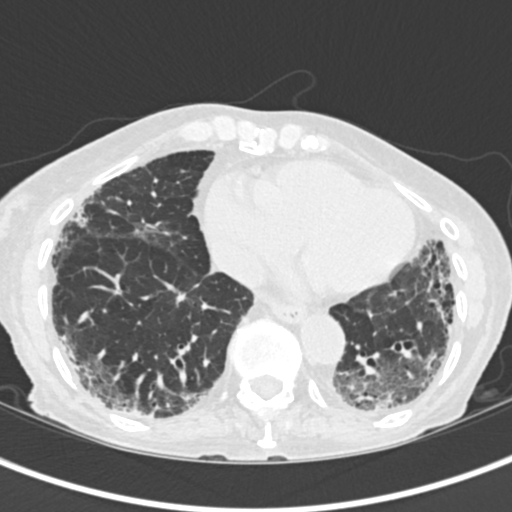
[im 59/159  mediastinal]
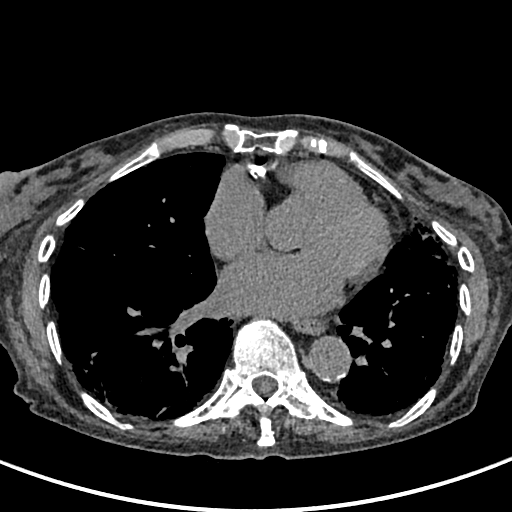
[im 59/159  lung]
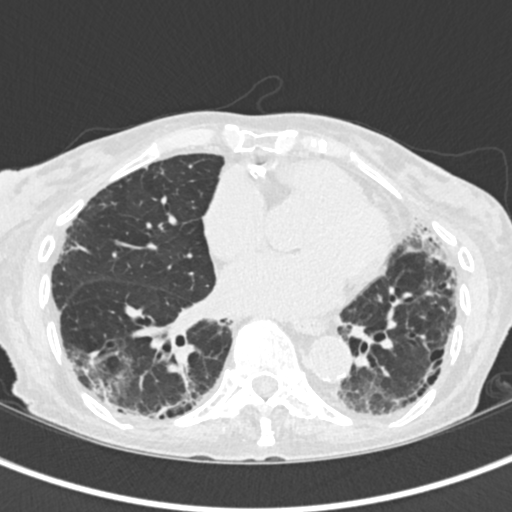
[im 71/159  lung]
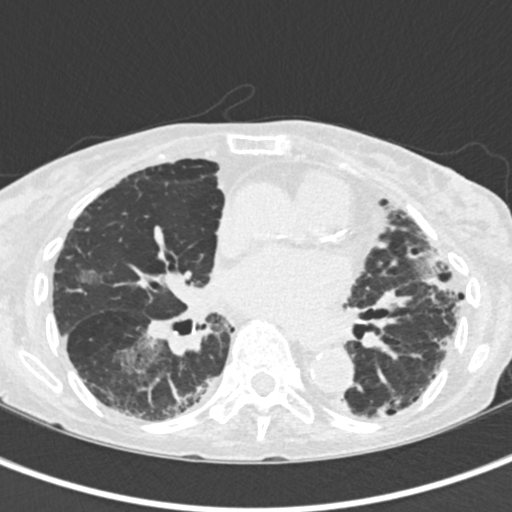
[im 88/159  lung]
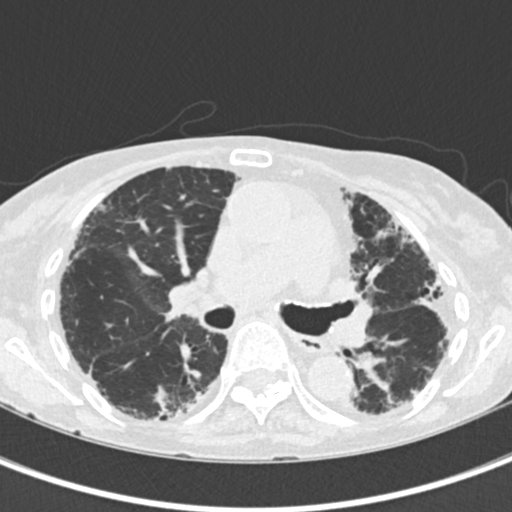
[im 100/159  lung]
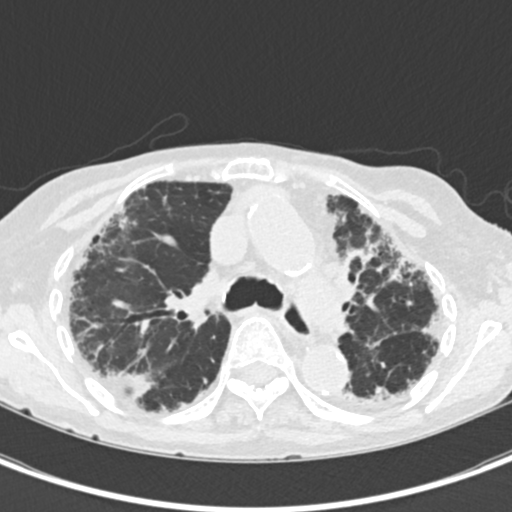
[im 112/159  mediastinal]
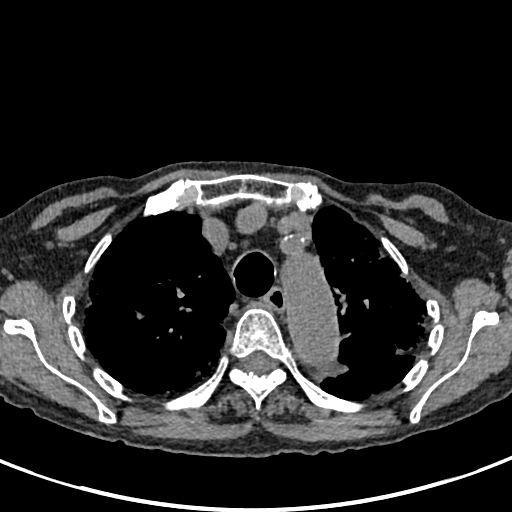
[im 112/159  lung]
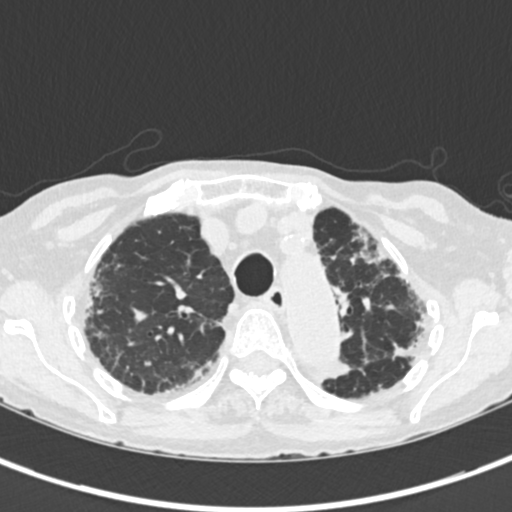
[im 123/159  lung]
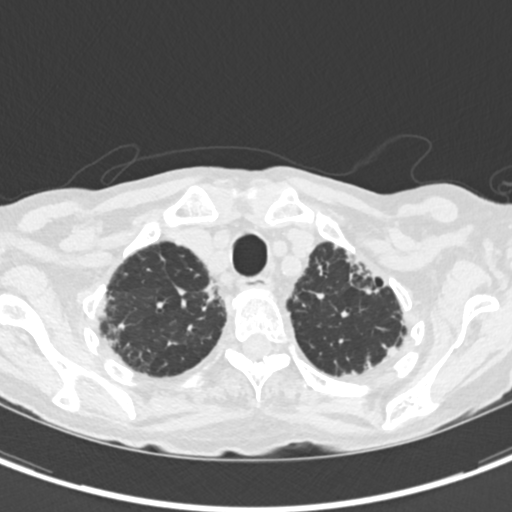
[im 135/159  lung]
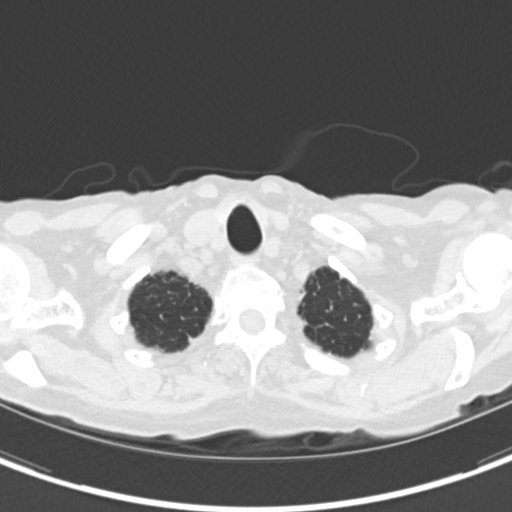
[im 147/159  lung]
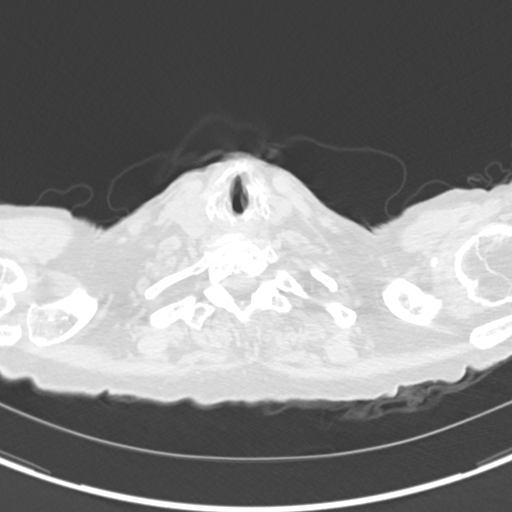

[Series 5: coronal · coronal · 0.59mm/px · 3 of 103 slices shown]
[im 21/103  lung]
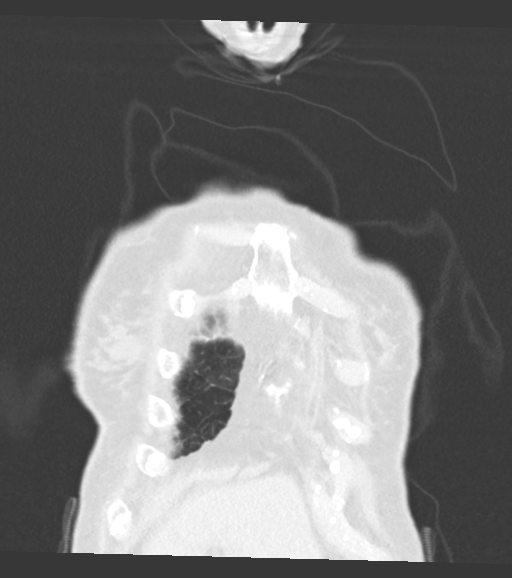
[im 41/103  lung]
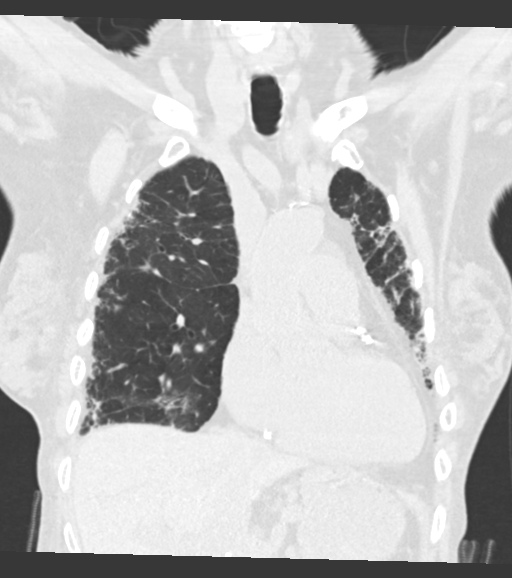
[im 62/103  lung]
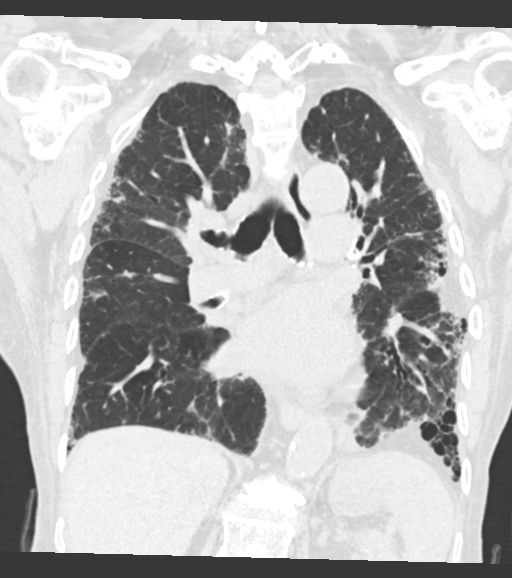

[15 of 36 positions shown; findings below may reference images not displayed]

FINDINGS: Cardiovascular: Atherosclerosis of thoracic aorta is noted without
aneurysm formation. Normal cardiac size. No pericardial effusion.
Moderate coronary artery calcifications are noted.

Mediastinum/Nodes: No significantly enlarged mediastinal or axillary
lymph nodes. Thyroid gland, trachea, and esophagus demonstrate no
significant findings.

Lungs/Pleura: No pneumothorax or pleural effusion is noted.
Peripheral honeycombing is noted in both lower lobes with associated
bronchiectasis of both lower lobes. Peripheral reticular densities
are noted in both upper lobes as well. These findings are most
consistent with pulmonary fibrosis, although superimposed acute
atypical inflammation cannot be excluded.

Upper Abdomen: No acute abnormality.

Musculoskeletal: Status post L1 kyphoplasty. No acute osseous
abnormality is noted.
IMPRESSION: Peripheral reticular densities are noted throughout both lungs with
peripheral honeycombing and bronchiectasis seen in the lower lobes,
most consistent with pulmonary fibrosis, although acute superimposed
atypical inflammation cannot be excluded.

Moderate coronary artery calcifications are noted suggesting
coronary artery disease.

Aortic Atherosclerosis ([id number]-5Y8.8).

## 2022-08-12 ENCOUNTER — Encounter (INDEPENDENT_AMBULATORY_CARE_PROVIDER_SITE_OTHER): Payer: Self-pay

## 2022-08-12 DIAGNOSIS — Z9981 Dependence on supplemental oxygen: Secondary | ICD-10-CM | POA: Diagnosis not present

## 2022-08-12 DIAGNOSIS — R55 Syncope and collapse: Secondary | ICD-10-CM | POA: Diagnosis not present

## 2022-08-12 DIAGNOSIS — M069 Rheumatoid arthritis, unspecified: Secondary | ICD-10-CM | POA: Diagnosis not present

## 2022-08-12 DIAGNOSIS — I7300 Raynaud's syndrome without gangrene: Secondary | ICD-10-CM | POA: Diagnosis not present

## 2022-08-12 DIAGNOSIS — M0510 Rheumatoid lung disease with rheumatoid arthritis of unspecified site: Secondary | ICD-10-CM | POA: Diagnosis not present

## 2022-08-12 DIAGNOSIS — Z741 Need for assistance with personal care: Secondary | ICD-10-CM | POA: Diagnosis not present

## 2022-08-12 DIAGNOSIS — M6281 Muscle weakness (generalized): Secondary | ICD-10-CM | POA: Diagnosis not present

## 2022-08-12 DIAGNOSIS — R278 Other lack of coordination: Secondary | ICD-10-CM | POA: Diagnosis not present

## 2022-08-12 DIAGNOSIS — S42215D Unspecified nondisplaced fracture of surgical neck of left humerus, subsequent encounter for fracture with routine healing: Secondary | ICD-10-CM | POA: Diagnosis not present

## 2022-08-12 DIAGNOSIS — J8410 Pulmonary fibrosis, unspecified: Secondary | ICD-10-CM | POA: Diagnosis not present

## 2022-08-12 DIAGNOSIS — R262 Difficulty in walking, not elsewhere classified: Secondary | ICD-10-CM | POA: Diagnosis not present

## 2022-08-14 DIAGNOSIS — R278 Other lack of coordination: Secondary | ICD-10-CM | POA: Diagnosis not present

## 2022-08-14 DIAGNOSIS — Z9981 Dependence on supplemental oxygen: Secondary | ICD-10-CM | POA: Diagnosis not present

## 2022-08-14 DIAGNOSIS — J8410 Pulmonary fibrosis, unspecified: Secondary | ICD-10-CM | POA: Diagnosis not present

## 2022-08-14 DIAGNOSIS — S42215D Unspecified nondisplaced fracture of surgical neck of left humerus, subsequent encounter for fracture with routine healing: Secondary | ICD-10-CM | POA: Diagnosis not present

## 2022-08-14 DIAGNOSIS — R262 Difficulty in walking, not elsewhere classified: Secondary | ICD-10-CM | POA: Diagnosis not present

## 2022-08-14 DIAGNOSIS — R55 Syncope and collapse: Secondary | ICD-10-CM | POA: Diagnosis not present

## 2022-08-14 DIAGNOSIS — M6281 Muscle weakness (generalized): Secondary | ICD-10-CM | POA: Diagnosis not present

## 2022-08-14 DIAGNOSIS — I7300 Raynaud's syndrome without gangrene: Secondary | ICD-10-CM | POA: Diagnosis not present

## 2022-08-14 DIAGNOSIS — Z741 Need for assistance with personal care: Secondary | ICD-10-CM | POA: Diagnosis not present

## 2022-08-14 DIAGNOSIS — M0510 Rheumatoid lung disease with rheumatoid arthritis of unspecified site: Secondary | ICD-10-CM | POA: Diagnosis not present

## 2022-08-14 DIAGNOSIS — M069 Rheumatoid arthritis, unspecified: Secondary | ICD-10-CM | POA: Diagnosis not present

## 2022-08-16 DIAGNOSIS — J8410 Pulmonary fibrosis, unspecified: Secondary | ICD-10-CM | POA: Diagnosis not present

## 2022-08-16 DIAGNOSIS — M069 Rheumatoid arthritis, unspecified: Secondary | ICD-10-CM | POA: Diagnosis not present

## 2022-08-16 DIAGNOSIS — M6281 Muscle weakness (generalized): Secondary | ICD-10-CM | POA: Diagnosis not present

## 2022-08-16 DIAGNOSIS — R278 Other lack of coordination: Secondary | ICD-10-CM | POA: Diagnosis not present

## 2022-08-16 DIAGNOSIS — I7300 Raynaud's syndrome without gangrene: Secondary | ICD-10-CM | POA: Diagnosis not present

## 2022-08-16 DIAGNOSIS — R262 Difficulty in walking, not elsewhere classified: Secondary | ICD-10-CM | POA: Diagnosis not present

## 2022-08-16 DIAGNOSIS — S42215D Unspecified nondisplaced fracture of surgical neck of left humerus, subsequent encounter for fracture with routine healing: Secondary | ICD-10-CM | POA: Diagnosis not present

## 2022-08-16 DIAGNOSIS — R55 Syncope and collapse: Secondary | ICD-10-CM | POA: Diagnosis not present

## 2022-08-16 DIAGNOSIS — Z9981 Dependence on supplemental oxygen: Secondary | ICD-10-CM | POA: Diagnosis not present

## 2022-08-16 DIAGNOSIS — M0510 Rheumatoid lung disease with rheumatoid arthritis of unspecified site: Secondary | ICD-10-CM | POA: Diagnosis not present

## 2022-08-16 DIAGNOSIS — Z741 Need for assistance with personal care: Secondary | ICD-10-CM | POA: Diagnosis not present

## 2022-08-19 DIAGNOSIS — I7300 Raynaud's syndrome without gangrene: Secondary | ICD-10-CM | POA: Diagnosis not present

## 2022-08-19 DIAGNOSIS — J8410 Pulmonary fibrosis, unspecified: Secondary | ICD-10-CM | POA: Diagnosis not present

## 2022-08-19 DIAGNOSIS — R278 Other lack of coordination: Secondary | ICD-10-CM | POA: Diagnosis not present

## 2022-08-19 DIAGNOSIS — M0510 Rheumatoid lung disease with rheumatoid arthritis of unspecified site: Secondary | ICD-10-CM | POA: Diagnosis not present

## 2022-08-19 DIAGNOSIS — R262 Difficulty in walking, not elsewhere classified: Secondary | ICD-10-CM | POA: Diagnosis not present

## 2022-08-19 DIAGNOSIS — R55 Syncope and collapse: Secondary | ICD-10-CM | POA: Diagnosis not present

## 2022-08-19 DIAGNOSIS — M6281 Muscle weakness (generalized): Secondary | ICD-10-CM | POA: Diagnosis not present

## 2022-08-19 DIAGNOSIS — Z9981 Dependence on supplemental oxygen: Secondary | ICD-10-CM | POA: Diagnosis not present

## 2022-08-19 DIAGNOSIS — S42215D Unspecified nondisplaced fracture of surgical neck of left humerus, subsequent encounter for fracture with routine healing: Secondary | ICD-10-CM | POA: Diagnosis not present

## 2022-08-19 DIAGNOSIS — M069 Rheumatoid arthritis, unspecified: Secondary | ICD-10-CM | POA: Diagnosis not present

## 2022-08-19 DIAGNOSIS — Z741 Need for assistance with personal care: Secondary | ICD-10-CM | POA: Diagnosis not present

## 2022-08-20 ENCOUNTER — Other Ambulatory Visit: Payer: Self-pay | Admitting: Pulmonary Disease

## 2022-08-20 DIAGNOSIS — J849 Interstitial pulmonary disease, unspecified: Secondary | ICD-10-CM

## 2022-08-20 DIAGNOSIS — J8410 Pulmonary fibrosis, unspecified: Secondary | ICD-10-CM

## 2022-08-20 DIAGNOSIS — J4489 Other specified chronic obstructive pulmonary disease: Secondary | ICD-10-CM

## 2022-08-21 DIAGNOSIS — M069 Rheumatoid arthritis, unspecified: Secondary | ICD-10-CM | POA: Diagnosis not present

## 2022-08-21 DIAGNOSIS — S42215D Unspecified nondisplaced fracture of surgical neck of left humerus, subsequent encounter for fracture with routine healing: Secondary | ICD-10-CM | POA: Diagnosis not present

## 2022-08-21 DIAGNOSIS — M6281 Muscle weakness (generalized): Secondary | ICD-10-CM | POA: Diagnosis not present

## 2022-08-21 DIAGNOSIS — Z9981 Dependence on supplemental oxygen: Secondary | ICD-10-CM | POA: Diagnosis not present

## 2022-08-21 DIAGNOSIS — I7300 Raynaud's syndrome without gangrene: Secondary | ICD-10-CM | POA: Diagnosis not present

## 2022-08-21 DIAGNOSIS — R278 Other lack of coordination: Secondary | ICD-10-CM | POA: Diagnosis not present

## 2022-08-21 DIAGNOSIS — J8410 Pulmonary fibrosis, unspecified: Secondary | ICD-10-CM | POA: Diagnosis not present

## 2022-08-21 DIAGNOSIS — R262 Difficulty in walking, not elsewhere classified: Secondary | ICD-10-CM | POA: Diagnosis not present

## 2022-08-21 DIAGNOSIS — R55 Syncope and collapse: Secondary | ICD-10-CM | POA: Diagnosis not present

## 2022-08-21 DIAGNOSIS — M0510 Rheumatoid lung disease with rheumatoid arthritis of unspecified site: Secondary | ICD-10-CM | POA: Diagnosis not present

## 2022-08-21 DIAGNOSIS — Z741 Need for assistance with personal care: Secondary | ICD-10-CM | POA: Diagnosis not present

## 2022-08-23 DIAGNOSIS — I7300 Raynaud's syndrome without gangrene: Secondary | ICD-10-CM | POA: Diagnosis not present

## 2022-08-23 DIAGNOSIS — R278 Other lack of coordination: Secondary | ICD-10-CM | POA: Diagnosis not present

## 2022-08-23 DIAGNOSIS — R55 Syncope and collapse: Secondary | ICD-10-CM | POA: Diagnosis not present

## 2022-08-23 DIAGNOSIS — Z9981 Dependence on supplemental oxygen: Secondary | ICD-10-CM | POA: Diagnosis not present

## 2022-08-23 DIAGNOSIS — M069 Rheumatoid arthritis, unspecified: Secondary | ICD-10-CM | POA: Diagnosis not present

## 2022-08-23 DIAGNOSIS — S42215D Unspecified nondisplaced fracture of surgical neck of left humerus, subsequent encounter for fracture with routine healing: Secondary | ICD-10-CM | POA: Diagnosis not present

## 2022-08-23 DIAGNOSIS — Z741 Need for assistance with personal care: Secondary | ICD-10-CM | POA: Diagnosis not present

## 2022-08-23 DIAGNOSIS — M0510 Rheumatoid lung disease with rheumatoid arthritis of unspecified site: Secondary | ICD-10-CM | POA: Diagnosis not present

## 2022-08-23 DIAGNOSIS — R262 Difficulty in walking, not elsewhere classified: Secondary | ICD-10-CM | POA: Diagnosis not present

## 2022-08-23 DIAGNOSIS — J8410 Pulmonary fibrosis, unspecified: Secondary | ICD-10-CM | POA: Diagnosis not present

## 2022-08-23 DIAGNOSIS — M6281 Muscle weakness (generalized): Secondary | ICD-10-CM | POA: Diagnosis not present

## 2022-08-26 DIAGNOSIS — R55 Syncope and collapse: Secondary | ICD-10-CM | POA: Diagnosis not present

## 2022-08-26 DIAGNOSIS — M6281 Muscle weakness (generalized): Secondary | ICD-10-CM | POA: Diagnosis not present

## 2022-08-26 DIAGNOSIS — M069 Rheumatoid arthritis, unspecified: Secondary | ICD-10-CM | POA: Diagnosis not present

## 2022-08-26 DIAGNOSIS — S42215D Unspecified nondisplaced fracture of surgical neck of left humerus, subsequent encounter for fracture with routine healing: Secondary | ICD-10-CM | POA: Diagnosis not present

## 2022-08-26 DIAGNOSIS — I7300 Raynaud's syndrome without gangrene: Secondary | ICD-10-CM | POA: Diagnosis not present

## 2022-08-26 DIAGNOSIS — Z741 Need for assistance with personal care: Secondary | ICD-10-CM | POA: Diagnosis not present

## 2022-08-26 DIAGNOSIS — J8410 Pulmonary fibrosis, unspecified: Secondary | ICD-10-CM | POA: Diagnosis not present

## 2022-08-26 DIAGNOSIS — M0510 Rheumatoid lung disease with rheumatoid arthritis of unspecified site: Secondary | ICD-10-CM | POA: Diagnosis not present

## 2022-08-26 DIAGNOSIS — R262 Difficulty in walking, not elsewhere classified: Secondary | ICD-10-CM | POA: Diagnosis not present

## 2022-08-26 DIAGNOSIS — R278 Other lack of coordination: Secondary | ICD-10-CM | POA: Diagnosis not present

## 2022-08-26 DIAGNOSIS — Z9981 Dependence on supplemental oxygen: Secondary | ICD-10-CM | POA: Diagnosis not present

## 2022-08-28 DIAGNOSIS — Z9981 Dependence on supplemental oxygen: Secondary | ICD-10-CM | POA: Diagnosis not present

## 2022-08-28 DIAGNOSIS — M6281 Muscle weakness (generalized): Secondary | ICD-10-CM | POA: Diagnosis not present

## 2022-08-28 DIAGNOSIS — R55 Syncope and collapse: Secondary | ICD-10-CM | POA: Diagnosis not present

## 2022-08-28 DIAGNOSIS — M069 Rheumatoid arthritis, unspecified: Secondary | ICD-10-CM | POA: Diagnosis not present

## 2022-08-28 DIAGNOSIS — S42215D Unspecified nondisplaced fracture of surgical neck of left humerus, subsequent encounter for fracture with routine healing: Secondary | ICD-10-CM | POA: Diagnosis not present

## 2022-08-28 DIAGNOSIS — M0510 Rheumatoid lung disease with rheumatoid arthritis of unspecified site: Secondary | ICD-10-CM | POA: Diagnosis not present

## 2022-08-28 DIAGNOSIS — R262 Difficulty in walking, not elsewhere classified: Secondary | ICD-10-CM | POA: Diagnosis not present

## 2022-08-28 DIAGNOSIS — Z741 Need for assistance with personal care: Secondary | ICD-10-CM | POA: Diagnosis not present

## 2022-08-28 DIAGNOSIS — R278 Other lack of coordination: Secondary | ICD-10-CM | POA: Diagnosis not present

## 2022-08-28 DIAGNOSIS — J8410 Pulmonary fibrosis, unspecified: Secondary | ICD-10-CM | POA: Diagnosis not present

## 2022-08-28 DIAGNOSIS — I7300 Raynaud's syndrome without gangrene: Secondary | ICD-10-CM | POA: Diagnosis not present

## 2022-08-29 DIAGNOSIS — M0510 Rheumatoid lung disease with rheumatoid arthritis of unspecified site: Secondary | ICD-10-CM | POA: Diagnosis not present

## 2022-08-29 DIAGNOSIS — M0579 Rheumatoid arthritis with rheumatoid factor of multiple sites without organ or systems involvement: Secondary | ICD-10-CM | POA: Diagnosis not present

## 2022-08-29 DIAGNOSIS — Z7960 Long term (current) use of unspecified immunomodulators and immunosuppressants: Secondary | ICD-10-CM | POA: Diagnosis not present

## 2022-08-29 DIAGNOSIS — M351 Other overlap syndromes: Secondary | ICD-10-CM | POA: Diagnosis not present

## 2022-08-30 DIAGNOSIS — R278 Other lack of coordination: Secondary | ICD-10-CM | POA: Diagnosis not present

## 2022-08-30 DIAGNOSIS — M0510 Rheumatoid lung disease with rheumatoid arthritis of unspecified site: Secondary | ICD-10-CM | POA: Diagnosis not present

## 2022-08-30 DIAGNOSIS — J8410 Pulmonary fibrosis, unspecified: Secondary | ICD-10-CM | POA: Diagnosis not present

## 2022-08-30 DIAGNOSIS — M069 Rheumatoid arthritis, unspecified: Secondary | ICD-10-CM | POA: Diagnosis not present

## 2022-08-30 DIAGNOSIS — Z741 Need for assistance with personal care: Secondary | ICD-10-CM | POA: Diagnosis not present

## 2022-08-30 DIAGNOSIS — R55 Syncope and collapse: Secondary | ICD-10-CM | POA: Diagnosis not present

## 2022-08-30 DIAGNOSIS — I7300 Raynaud's syndrome without gangrene: Secondary | ICD-10-CM | POA: Diagnosis not present

## 2022-08-30 DIAGNOSIS — R262 Difficulty in walking, not elsewhere classified: Secondary | ICD-10-CM | POA: Diagnosis not present

## 2022-08-30 DIAGNOSIS — Z9981 Dependence on supplemental oxygen: Secondary | ICD-10-CM | POA: Diagnosis not present

## 2022-08-30 DIAGNOSIS — S42215D Unspecified nondisplaced fracture of surgical neck of left humerus, subsequent encounter for fracture with routine healing: Secondary | ICD-10-CM | POA: Diagnosis not present

## 2022-08-30 DIAGNOSIS — M6281 Muscle weakness (generalized): Secondary | ICD-10-CM | POA: Diagnosis not present

## 2022-09-02 ENCOUNTER — Ambulatory Visit
Admission: RE | Admit: 2022-09-02 | Discharge: 2022-09-02 | Disposition: A | Discharge status: Home / Self Care | Payer: Medicare Other | Source: Ambulatory Visit | Attending: Pulmonary Disease | Admitting: Pulmonary Disease

## 2022-09-02 DIAGNOSIS — J941 Fibrothorax: Secondary | ICD-10-CM | POA: Diagnosis not present

## 2022-09-02 DIAGNOSIS — J4489 Other specified chronic obstructive pulmonary disease: Secondary | ICD-10-CM | POA: Diagnosis not present

## 2022-09-02 DIAGNOSIS — J849 Interstitial pulmonary disease, unspecified: Secondary | ICD-10-CM | POA: Diagnosis not present

## 2022-09-02 DIAGNOSIS — J8410 Pulmonary fibrosis, unspecified: Secondary | ICD-10-CM | POA: Diagnosis not present

## 2022-09-02 DIAGNOSIS — I700 Atherosclerosis of aorta: Secondary | ICD-10-CM | POA: Diagnosis not present

## 2022-09-02 DIAGNOSIS — J479 Bronchiectasis, uncomplicated: Secondary | ICD-10-CM | POA: Diagnosis not present

## 2022-09-02 DIAGNOSIS — J84112 Idiopathic pulmonary fibrosis: Secondary | ICD-10-CM | POA: Diagnosis not present

## 2022-09-03 DIAGNOSIS — J8410 Pulmonary fibrosis, unspecified: Secondary | ICD-10-CM | POA: Diagnosis not present

## 2022-09-03 DIAGNOSIS — Z9981 Dependence on supplemental oxygen: Secondary | ICD-10-CM | POA: Diagnosis not present

## 2022-09-03 DIAGNOSIS — M0510 Rheumatoid lung disease with rheumatoid arthritis of unspecified site: Secondary | ICD-10-CM | POA: Diagnosis not present

## 2022-09-03 DIAGNOSIS — R55 Syncope and collapse: Secondary | ICD-10-CM | POA: Diagnosis not present

## 2022-09-03 DIAGNOSIS — R278 Other lack of coordination: Secondary | ICD-10-CM | POA: Diagnosis not present

## 2022-09-03 DIAGNOSIS — M6281 Muscle weakness (generalized): Secondary | ICD-10-CM | POA: Diagnosis not present

## 2022-09-03 DIAGNOSIS — S42215D Unspecified nondisplaced fracture of surgical neck of left humerus, subsequent encounter for fracture with routine healing: Secondary | ICD-10-CM | POA: Diagnosis not present

## 2022-09-03 DIAGNOSIS — R262 Difficulty in walking, not elsewhere classified: Secondary | ICD-10-CM | POA: Diagnosis not present

## 2022-09-03 DIAGNOSIS — I7300 Raynaud's syndrome without gangrene: Secondary | ICD-10-CM | POA: Diagnosis not present

## 2022-09-03 DIAGNOSIS — M069 Rheumatoid arthritis, unspecified: Secondary | ICD-10-CM | POA: Diagnosis not present

## 2022-09-03 DIAGNOSIS — Z741 Need for assistance with personal care: Secondary | ICD-10-CM | POA: Diagnosis not present

## 2022-09-04 DIAGNOSIS — H0289 Other specified disorders of eyelid: Secondary | ICD-10-CM | POA: Diagnosis not present

## 2022-09-09 DIAGNOSIS — Z9981 Dependence on supplemental oxygen: Secondary | ICD-10-CM | POA: Diagnosis not present

## 2022-09-09 DIAGNOSIS — R262 Difficulty in walking, not elsewhere classified: Secondary | ICD-10-CM | POA: Diagnosis not present

## 2022-09-09 DIAGNOSIS — S42215D Unspecified nondisplaced fracture of surgical neck of left humerus, subsequent encounter for fracture with routine healing: Secondary | ICD-10-CM | POA: Diagnosis not present

## 2022-09-09 DIAGNOSIS — R55 Syncope and collapse: Secondary | ICD-10-CM | POA: Diagnosis not present

## 2022-09-09 DIAGNOSIS — M6281 Muscle weakness (generalized): Secondary | ICD-10-CM | POA: Diagnosis not present

## 2022-09-09 DIAGNOSIS — J42 Unspecified chronic bronchitis: Secondary | ICD-10-CM | POA: Diagnosis not present

## 2022-09-09 DIAGNOSIS — M069 Rheumatoid arthritis, unspecified: Secondary | ICD-10-CM | POA: Diagnosis not present

## 2022-09-09 DIAGNOSIS — Z741 Need for assistance with personal care: Secondary | ICD-10-CM | POA: Diagnosis not present

## 2022-09-09 DIAGNOSIS — R278 Other lack of coordination: Secondary | ICD-10-CM | POA: Diagnosis not present

## 2022-09-09 DIAGNOSIS — I7300 Raynaud's syndrome without gangrene: Secondary | ICD-10-CM | POA: Diagnosis not present

## 2022-09-09 DIAGNOSIS — M0510 Rheumatoid lung disease with rheumatoid arthritis of unspecified site: Secondary | ICD-10-CM | POA: Diagnosis not present

## 2022-09-09 DIAGNOSIS — J8410 Pulmonary fibrosis, unspecified: Secondary | ICD-10-CM | POA: Diagnosis not present

## 2022-09-10 DIAGNOSIS — M6281 Muscle weakness (generalized): Secondary | ICD-10-CM | POA: Diagnosis not present

## 2022-09-10 DIAGNOSIS — Z741 Need for assistance with personal care: Secondary | ICD-10-CM | POA: Diagnosis not present

## 2022-09-10 DIAGNOSIS — I7300 Raynaud's syndrome without gangrene: Secondary | ICD-10-CM | POA: Diagnosis not present

## 2022-09-10 DIAGNOSIS — R55 Syncope and collapse: Secondary | ICD-10-CM | POA: Diagnosis not present

## 2022-09-10 DIAGNOSIS — S42215D Unspecified nondisplaced fracture of surgical neck of left humerus, subsequent encounter for fracture with routine healing: Secondary | ICD-10-CM | POA: Diagnosis not present

## 2022-09-10 DIAGNOSIS — R262 Difficulty in walking, not elsewhere classified: Secondary | ICD-10-CM | POA: Diagnosis not present

## 2022-09-10 DIAGNOSIS — M0510 Rheumatoid lung disease with rheumatoid arthritis of unspecified site: Secondary | ICD-10-CM | POA: Diagnosis not present

## 2022-09-10 DIAGNOSIS — M069 Rheumatoid arthritis, unspecified: Secondary | ICD-10-CM | POA: Diagnosis not present

## 2022-09-10 DIAGNOSIS — Z9981 Dependence on supplemental oxygen: Secondary | ICD-10-CM | POA: Diagnosis not present

## 2022-09-10 DIAGNOSIS — J8410 Pulmonary fibrosis, unspecified: Secondary | ICD-10-CM | POA: Diagnosis not present

## 2022-09-10 DIAGNOSIS — R278 Other lack of coordination: Secondary | ICD-10-CM | POA: Diagnosis not present

## 2022-09-11 DIAGNOSIS — M069 Rheumatoid arthritis, unspecified: Secondary | ICD-10-CM | POA: Diagnosis not present

## 2022-09-11 DIAGNOSIS — I7300 Raynaud's syndrome without gangrene: Secondary | ICD-10-CM | POA: Diagnosis not present

## 2022-09-11 DIAGNOSIS — R262 Difficulty in walking, not elsewhere classified: Secondary | ICD-10-CM | POA: Diagnosis not present

## 2022-09-11 DIAGNOSIS — Z9981 Dependence on supplemental oxygen: Secondary | ICD-10-CM | POA: Diagnosis not present

## 2022-09-11 DIAGNOSIS — Z741 Need for assistance with personal care: Secondary | ICD-10-CM | POA: Diagnosis not present

## 2022-09-11 DIAGNOSIS — S42212A Unspecified displaced fracture of surgical neck of left humerus, initial encounter for closed fracture: Secondary | ICD-10-CM | POA: Diagnosis not present

## 2022-09-11 DIAGNOSIS — M0510 Rheumatoid lung disease with rheumatoid arthritis of unspecified site: Secondary | ICD-10-CM | POA: Diagnosis not present

## 2022-09-11 DIAGNOSIS — M6281 Muscle weakness (generalized): Secondary | ICD-10-CM | POA: Diagnosis not present

## 2022-09-11 DIAGNOSIS — R278 Other lack of coordination: Secondary | ICD-10-CM | POA: Diagnosis not present

## 2022-09-11 DIAGNOSIS — R55 Syncope and collapse: Secondary | ICD-10-CM | POA: Diagnosis not present

## 2022-09-11 DIAGNOSIS — J8410 Pulmonary fibrosis, unspecified: Secondary | ICD-10-CM | POA: Diagnosis not present

## 2022-09-11 DIAGNOSIS — S42215D Unspecified nondisplaced fracture of surgical neck of left humerus, subsequent encounter for fracture with routine healing: Secondary | ICD-10-CM | POA: Diagnosis not present

## 2022-09-16 DIAGNOSIS — S42215D Unspecified nondisplaced fracture of surgical neck of left humerus, subsequent encounter for fracture with routine healing: Secondary | ICD-10-CM | POA: Diagnosis not present

## 2022-09-16 DIAGNOSIS — R55 Syncope and collapse: Secondary | ICD-10-CM | POA: Diagnosis not present

## 2022-09-16 DIAGNOSIS — Z741 Need for assistance with personal care: Secondary | ICD-10-CM | POA: Diagnosis not present

## 2022-09-16 DIAGNOSIS — M6281 Muscle weakness (generalized): Secondary | ICD-10-CM | POA: Diagnosis not present

## 2022-09-16 DIAGNOSIS — J8410 Pulmonary fibrosis, unspecified: Secondary | ICD-10-CM | POA: Diagnosis not present

## 2022-09-16 DIAGNOSIS — I7300 Raynaud's syndrome without gangrene: Secondary | ICD-10-CM | POA: Diagnosis not present

## 2022-09-16 DIAGNOSIS — M0510 Rheumatoid lung disease with rheumatoid arthritis of unspecified site: Secondary | ICD-10-CM | POA: Diagnosis not present

## 2022-09-16 DIAGNOSIS — Z9981 Dependence on supplemental oxygen: Secondary | ICD-10-CM | POA: Diagnosis not present

## 2022-09-16 DIAGNOSIS — M069 Rheumatoid arthritis, unspecified: Secondary | ICD-10-CM | POA: Diagnosis not present

## 2022-09-16 DIAGNOSIS — R278 Other lack of coordination: Secondary | ICD-10-CM | POA: Diagnosis not present

## 2022-09-16 DIAGNOSIS — R262 Difficulty in walking, not elsewhere classified: Secondary | ICD-10-CM | POA: Diagnosis not present

## 2022-09-18 DIAGNOSIS — R278 Other lack of coordination: Secondary | ICD-10-CM | POA: Diagnosis not present

## 2022-09-18 DIAGNOSIS — M0510 Rheumatoid lung disease with rheumatoid arthritis of unspecified site: Secondary | ICD-10-CM | POA: Diagnosis not present

## 2022-09-18 DIAGNOSIS — J8410 Pulmonary fibrosis, unspecified: Secondary | ICD-10-CM | POA: Diagnosis not present

## 2022-09-18 DIAGNOSIS — S42215D Unspecified nondisplaced fracture of surgical neck of left humerus, subsequent encounter for fracture with routine healing: Secondary | ICD-10-CM | POA: Diagnosis not present

## 2022-09-18 DIAGNOSIS — Z9981 Dependence on supplemental oxygen: Secondary | ICD-10-CM | POA: Diagnosis not present

## 2022-09-18 DIAGNOSIS — M6281 Muscle weakness (generalized): Secondary | ICD-10-CM | POA: Diagnosis not present

## 2022-09-18 DIAGNOSIS — I7300 Raynaud's syndrome without gangrene: Secondary | ICD-10-CM | POA: Diagnosis not present

## 2022-09-18 DIAGNOSIS — R55 Syncope and collapse: Secondary | ICD-10-CM | POA: Diagnosis not present

## 2022-09-18 DIAGNOSIS — R262 Difficulty in walking, not elsewhere classified: Secondary | ICD-10-CM | POA: Diagnosis not present

## 2022-09-18 DIAGNOSIS — Z741 Need for assistance with personal care: Secondary | ICD-10-CM | POA: Diagnosis not present

## 2022-09-18 DIAGNOSIS — M069 Rheumatoid arthritis, unspecified: Secondary | ICD-10-CM | POA: Diagnosis not present

## 2022-09-20 DIAGNOSIS — R278 Other lack of coordination: Secondary | ICD-10-CM | POA: Diagnosis not present

## 2022-09-20 DIAGNOSIS — I7300 Raynaud's syndrome without gangrene: Secondary | ICD-10-CM | POA: Diagnosis not present

## 2022-09-20 DIAGNOSIS — M069 Rheumatoid arthritis, unspecified: Secondary | ICD-10-CM | POA: Diagnosis not present

## 2022-09-20 DIAGNOSIS — Z741 Need for assistance with personal care: Secondary | ICD-10-CM | POA: Diagnosis not present

## 2022-09-20 DIAGNOSIS — Z9981 Dependence on supplemental oxygen: Secondary | ICD-10-CM | POA: Diagnosis not present

## 2022-09-20 DIAGNOSIS — J8410 Pulmonary fibrosis, unspecified: Secondary | ICD-10-CM | POA: Diagnosis not present

## 2022-09-20 DIAGNOSIS — R262 Difficulty in walking, not elsewhere classified: Secondary | ICD-10-CM | POA: Diagnosis not present

## 2022-09-20 DIAGNOSIS — M0510 Rheumatoid lung disease with rheumatoid arthritis of unspecified site: Secondary | ICD-10-CM | POA: Diagnosis not present

## 2022-09-20 DIAGNOSIS — M6281 Muscle weakness (generalized): Secondary | ICD-10-CM | POA: Diagnosis not present

## 2022-09-20 DIAGNOSIS — S42215D Unspecified nondisplaced fracture of surgical neck of left humerus, subsequent encounter for fracture with routine healing: Secondary | ICD-10-CM | POA: Diagnosis not present

## 2022-09-20 DIAGNOSIS — R55 Syncope and collapse: Secondary | ICD-10-CM | POA: Diagnosis not present

## 2022-09-23 DIAGNOSIS — R262 Difficulty in walking, not elsewhere classified: Secondary | ICD-10-CM | POA: Diagnosis not present

## 2022-09-23 DIAGNOSIS — J8410 Pulmonary fibrosis, unspecified: Secondary | ICD-10-CM | POA: Diagnosis not present

## 2022-09-23 DIAGNOSIS — M6281 Muscle weakness (generalized): Secondary | ICD-10-CM | POA: Diagnosis not present

## 2022-09-23 DIAGNOSIS — I7300 Raynaud's syndrome without gangrene: Secondary | ICD-10-CM | POA: Diagnosis not present

## 2022-09-23 DIAGNOSIS — S42215D Unspecified nondisplaced fracture of surgical neck of left humerus, subsequent encounter for fracture with routine healing: Secondary | ICD-10-CM | POA: Diagnosis not present

## 2022-09-23 DIAGNOSIS — M069 Rheumatoid arthritis, unspecified: Secondary | ICD-10-CM | POA: Diagnosis not present

## 2022-09-23 DIAGNOSIS — R278 Other lack of coordination: Secondary | ICD-10-CM | POA: Diagnosis not present

## 2022-09-23 DIAGNOSIS — Z9981 Dependence on supplemental oxygen: Secondary | ICD-10-CM | POA: Diagnosis not present

## 2022-09-23 DIAGNOSIS — Z741 Need for assistance with personal care: Secondary | ICD-10-CM | POA: Diagnosis not present

## 2022-09-23 DIAGNOSIS — R55 Syncope and collapse: Secondary | ICD-10-CM | POA: Diagnosis not present

## 2022-09-23 DIAGNOSIS — M0510 Rheumatoid lung disease with rheumatoid arthritis of unspecified site: Secondary | ICD-10-CM | POA: Diagnosis not present

## 2022-09-24 DIAGNOSIS — J849 Interstitial pulmonary disease, unspecified: Secondary | ICD-10-CM | POA: Diagnosis not present

## 2022-09-25 DIAGNOSIS — R262 Difficulty in walking, not elsewhere classified: Secondary | ICD-10-CM | POA: Diagnosis not present

## 2022-09-25 DIAGNOSIS — Z741 Need for assistance with personal care: Secondary | ICD-10-CM | POA: Diagnosis not present

## 2022-09-25 DIAGNOSIS — M0510 Rheumatoid lung disease with rheumatoid arthritis of unspecified site: Secondary | ICD-10-CM | POA: Diagnosis not present

## 2022-09-25 DIAGNOSIS — M069 Rheumatoid arthritis, unspecified: Secondary | ICD-10-CM | POA: Diagnosis not present

## 2022-09-25 DIAGNOSIS — S42215D Unspecified nondisplaced fracture of surgical neck of left humerus, subsequent encounter for fracture with routine healing: Secondary | ICD-10-CM | POA: Diagnosis not present

## 2022-09-25 DIAGNOSIS — R278 Other lack of coordination: Secondary | ICD-10-CM | POA: Diagnosis not present

## 2022-09-25 DIAGNOSIS — J8410 Pulmonary fibrosis, unspecified: Secondary | ICD-10-CM | POA: Diagnosis not present

## 2022-09-25 DIAGNOSIS — M6281 Muscle weakness (generalized): Secondary | ICD-10-CM | POA: Diagnosis not present

## 2022-09-25 DIAGNOSIS — R55 Syncope and collapse: Secondary | ICD-10-CM | POA: Diagnosis not present

## 2022-09-25 DIAGNOSIS — I7300 Raynaud's syndrome without gangrene: Secondary | ICD-10-CM | POA: Diagnosis not present

## 2022-09-25 DIAGNOSIS — Z9981 Dependence on supplemental oxygen: Secondary | ICD-10-CM | POA: Diagnosis not present

## 2022-10-09 DIAGNOSIS — J42 Unspecified chronic bronchitis: Secondary | ICD-10-CM | POA: Diagnosis not present

## 2022-10-09 DIAGNOSIS — Z9981 Dependence on supplemental oxygen: Secondary | ICD-10-CM | POA: Diagnosis not present

## 2022-10-09 DIAGNOSIS — J8410 Pulmonary fibrosis, unspecified: Secondary | ICD-10-CM | POA: Diagnosis not present

## 2022-11-05 DIAGNOSIS — I10 Essential (primary) hypertension: Secondary | ICD-10-CM | POA: Diagnosis not present

## 2022-11-09 DIAGNOSIS — J42 Unspecified chronic bronchitis: Secondary | ICD-10-CM | POA: Diagnosis not present

## 2022-11-09 DIAGNOSIS — Z9981 Dependence on supplemental oxygen: Secondary | ICD-10-CM | POA: Diagnosis not present

## 2022-11-09 DIAGNOSIS — J8410 Pulmonary fibrosis, unspecified: Secondary | ICD-10-CM | POA: Diagnosis not present

## 2022-11-12 DIAGNOSIS — Z136 Encounter for screening for cardiovascular disorders: Secondary | ICD-10-CM | POA: Diagnosis not present

## 2022-11-12 DIAGNOSIS — M810 Age-related osteoporosis without current pathological fracture: Secondary | ICD-10-CM | POA: Diagnosis not present

## 2022-11-12 DIAGNOSIS — N1831 Chronic kidney disease, stage 3a: Secondary | ICD-10-CM | POA: Diagnosis not present

## 2022-11-12 DIAGNOSIS — I129 Hypertensive chronic kidney disease with stage 1 through stage 4 chronic kidney disease, or unspecified chronic kidney disease: Secondary | ICD-10-CM | POA: Diagnosis not present

## 2022-12-10 DIAGNOSIS — Z9981 Dependence on supplemental oxygen: Secondary | ICD-10-CM | POA: Diagnosis not present

## 2022-12-10 DIAGNOSIS — J8410 Pulmonary fibrosis, unspecified: Secondary | ICD-10-CM | POA: Diagnosis not present

## 2022-12-10 DIAGNOSIS — J42 Unspecified chronic bronchitis: Secondary | ICD-10-CM | POA: Diagnosis not present

## 2022-12-25 ENCOUNTER — Ambulatory Visit: Payer: Medicare Other | Admitting: Dermatology

## 2022-12-25 VITALS — BP 144/81 | HR 64

## 2022-12-25 DIAGNOSIS — L570 Actinic keratosis: Secondary | ICD-10-CM

## 2022-12-25 DIAGNOSIS — L578 Other skin changes due to chronic exposure to nonionizing radiation: Secondary | ICD-10-CM

## 2022-12-25 DIAGNOSIS — L820 Inflamed seborrheic keratosis: Secondary | ICD-10-CM

## 2022-12-25 DIAGNOSIS — L821 Other seborrheic keratosis: Secondary | ICD-10-CM

## 2022-12-25 NOTE — Patient Instructions (Addendum)
Cryotherapy Aftercare  Wash gently with soap and water everyday.   Apply Vaseline and Band-Aid daily until healed.     Due to recent changes in healthcare laws, you may see results of your pathology and/or laboratory studies on MyChart before the doctors have had a chance to review them. We understand that in some cases there may be results that are confusing or concerning to you. Please understand that not all results are received at the same time and often the doctors may need to interpret multiple results in order to provide you with the best plan of care or course of treatment. Therefore, we ask that you please give us 2 business days to thoroughly review all your results before contacting the office for clarification. Should we see a critical lab result, you will be contacted sooner.   If You Need Anything After Your Visit  If you have any questions or concerns for your doctor, please call our main line at [phone] and press option 4 to reach your doctor's medical assistant. If no one answers, please leave a voicemail as directed and we will return your call as soon as possible. Messages left after 4 pm will be answered the following business day.   You may also send us a message via MyChart. We typically respond to MyChart messages within 1-2 business days.  For prescription refills, please ask your pharmacy to contact our office. Our fax number is [phone].  If you have an urgent issue when the clinic is closed that cannot wait until the next business day, you can page your doctor at the number below.    Please note that while we do our best to be available for urgent issues outside of office hours, we are not available 24/7.   If you have an urgent issue and are unable to reach us, you may choose to seek medical care at your doctor's office, retail clinic, urgent care center, or emergency room.  If you have a medical emergency, please immediately call 911 or go to the  emergency department.  Pager Numbers  - Dr. Kowalski: [phone]  - Dr. Moye: [phone]  - Dr. Stewart: [phone]  In the event of inclement weather, please call our main line at [phone] for an update on the status of any delays or closures.  Dermatology Medication Tips: Please keep the boxes that topical medications come in in order to help keep track of the instructions about where and how to use these. Pharmacies typically print the medication instructions only on the boxes and not directly on the medication tubes.   If your medication is too expensive, please contact our office at [phone] option 4 or send us a message through MyChart.   We are unable to tell what your co-pay for medications will be in advance as this is different depending on your insurance coverage. However, we may be able to find a substitute medication at lower cost or fill out paperwork to get insurance to cover a needed medication.   If a prior authorization is required to get your medication covered by your insurance company, please allow us 1-2 business days to complete this process.  Drug prices often vary depending on where the prescription is filled and some pharmacies may offer cheaper prices.  The website www.goodrx.com contains coupons for medications through different pharmacies. The prices here do not account for what the cost may be with help from insurance (it may be cheaper with your insurance), but the website can   give you the price if you did not use any insurance.  - You can print the associated coupon and take it with your prescription to the pharmacy.  - You may also stop by our office during regular business hours and pick up a GoodRx coupon card.  - If you need your prescription sent electronically to a different pharmacy, notify our office through [facility] MyChart or by phone at [phone] option 4.     Si Usted Necesita Algo Despus de Su Visita  Tambin puede  enviarnos un mensaje a travs de MyChart. Por lo general respondemos a los mensajes de MyChart en el transcurso de 1 a 2 das hbiles.  Para renovar recetas, por favor pida a su farmacia que se ponga en contacto con nuestra oficina. Nuestro nmero de fax es el [phone].  Si tiene un asunto urgente cuando la clnica est cerrada y que no puede esperar hasta el siguiente da hbil, puede llamar/localizar a su doctor(a) al nmero que aparece a continuacin.   Por favor, tenga en cuenta que aunque hacemos todo lo posible para estar disponibles para asuntos urgentes fuera del horario de oficina, no estamos disponibles las 24 horas del da, los 7 das de la semana.   Si tiene un problema urgente y no puede comunicarse con nosotros, puede optar por buscar atencin mdica  en el consultorio de su doctor(a), en una clnica privada, en un centro de atencin urgente o en una sala de emergencias.  Si tiene una emergencia mdica, por favor llame inmediatamente al 911 o vaya a la sala de emergencias.  Nmeros de bper  - Dr. Kowalski: [phone]  - Dra. Moye: [phone]  - Dra. Stewart: [phone]  En caso de inclemencias del tiempo, por favor llame a nuestra lnea principal al [phone] para una actualizacin sobre el estado de cualquier retraso o cierre.  Consejos para la medicacin en dermatologa: Por favor, guarde las cajas en las que vienen los medicamentos de uso tpico para ayudarle a seguir las instrucciones sobre dnde y cmo usarlos. Las farmacias generalmente imprimen las instrucciones del medicamento slo en las cajas y no directamente en los tubos del medicamento.   Si su medicamento es muy caro, por favor, pngase en contacto con nuestra oficina llamando al [phone] y presione la opcin 4 o envenos un mensaje a travs de MyChart.   No podemos decirle cul ser su copago por los medicamentos por adelantado ya que esto es diferente dependiendo de la cobertura de su seguro.  Sin embargo, es posible que podamos encontrar un medicamento sustituto a menor costo o llenar un formulario para que el seguro cubra el medicamento que se considera necesario.   Si se requiere una autorizacin previa para que su compaa de seguros cubra su medicamento, por favor permtanos de 1 a 2 das hbiles para completar este proceso.  Los precios de los medicamentos varan con frecuencia dependiendo del lugar de dnde se surte la receta y alguna farmacias pueden ofrecer precios ms baratos.  El sitio web www.goodrx.com tiene cupones para medicamentos de diferentes farmacias. Los precios aqu no tienen en cuenta lo que podra costar con la ayuda del seguro (puede ser ms barato con su seguro), pero el sitio web puede darle el precio si no utiliz ningn seguro.  - Puede imprimir el cupn correspondiente y llevarlo con su receta a la farmacia.  - Tambin puede pasar por nuestra oficina durante el horario de atencin regular y recoger una tarjeta de cupones de GoodRx.  -   Si necesita que su receta se enve electrnicamente a una farmacia diferente, informe a nuestra oficina a travs de MyChart de [place] o por telfono llamando al [phone] y presione la opcin 4.  

## 2022-12-25 NOTE — Progress Notes (Signed)
New Patient Visit  Subjective  Morgan Sandoval is a 88 y.o. female who presents for the following: Skin Problem. The patient has spots on her arms, hands and face to be evaluated, some may be new or changing and the patient has concerns that these could be cancer.   The following portions of the chart were reviewed this encounter and updated as appropriate:   Tobacco  Allergies  Meds  Problems  Med Hx  Surg Hx  Fam Hx     Review of Systems:  No other skin or systemic complaints except as noted in HPI or Assessment and Plan.  Objective  Well appearing patient in no apparent distress; mood and affect are within normal limits.  A focused examination was performed including arms,hands,face. Relevant physical exam findings are noted in the Assessment and Plan.  right dorsum hand (2) Erythematous thin papules/macules with gritty scale.   left lateral wrist, left volar forearm, left temple (3) Stuck-on, waxy, tan-brown papules -- Discussed benign etiology and prognosis.    Assessment & Plan  AK (actinic keratosis) (2) right dorsum hand  Actinic keratoses are precancerous spots that appear secondary to cumulative UV radiation exposure/sun exposure over time. They are chronic with expected duration over 1 year. A portion of actinic keratoses will progress to squamous cell carcinoma of the skin. It is not possible to reliably predict which spots will progress to skin cancer and so treatment is recommended to prevent development of skin cancer.  Recommend daily broad spectrum sunscreen SPF 30+ to sun-exposed areas, reapply every 2 hours as needed.  Recommend staying in the shade or wearing long sleeves, sun glasses (UVA+UVB protection) and wide brim hats (4-inch brim around the entire circumference of the hat). Call for new or changing lesions.   Destruction of lesion - right dorsum hand Complexity: simple   Destruction method: cryotherapy   Informed consent: discussed and  consent obtained   Timeout:  patient name, date of birth, surgical site, and procedure verified Lesion destroyed using liquid nitrogen: Yes   Region frozen until ice ball extended beyond lesion: Yes   Outcome: patient tolerated procedure well with no complications   Post-procedure details: wound care instructions given    Inflamed seborrheic keratosis (3) left lateral wrist, left volar forearm, left temple  Symptomatic, irritating, patient would like treated.   Destruction of lesion - left lateral wrist, left volar forearm, left temple Complexity: simple   Destruction method: cryotherapy   Informed consent: discussed and consent obtained   Timeout:  patient name, date of birth, surgical site, and procedure verified Lesion destroyed using liquid nitrogen: Yes   Region frozen until ice ball extended beyond lesion: Yes   Outcome: patient tolerated procedure well with no complications   Post-procedure details: wound care instructions given    Actinic Damage - chronic, secondary to cumulative UV radiation exposure/sun exposure over time - diffuse scaly erythematous macules with underlying dyspigmentation - Recommend daily broad spectrum sunscreen SPF 30+ to sun-exposed areas, reapply every 2 hours as needed.  - Recommend staying in the shade or wearing long sleeves, sun glasses (UVA+UVB protection) and wide brim hats (4-inch brim around the entire circumference of the hat). - Call for new or changing lesions.   Seborrheic Keratoses - Stuck-on, waxy, tan-brown papules and/or plaques  - Benign-appearing - Discussed benign etiology and prognosis. - Observe - Call for any changes  Return in about 4 months (around 04/26/2023) for ISK, AKs .  I, Marye Round, CMA, am  acting as scribe for Sarina Ser, MD .  Documentation: I have reviewed the above documentation for accuracy and completeness, and I agree with the above.  Sarina Ser, MD

## 2022-12-26 DIAGNOSIS — J849 Interstitial pulmonary disease, unspecified: Secondary | ICD-10-CM | POA: Diagnosis not present

## 2022-12-27 ENCOUNTER — Encounter: Payer: Self-pay | Admitting: Dermatology

## 2022-12-30 DIAGNOSIS — M0510 Rheumatoid lung disease with rheumatoid arthritis of unspecified site: Secondary | ICD-10-CM | POA: Diagnosis not present

## 2022-12-30 DIAGNOSIS — M0579 Rheumatoid arthritis with rheumatoid factor of multiple sites without organ or systems involvement: Secondary | ICD-10-CM | POA: Diagnosis not present

## 2022-12-30 DIAGNOSIS — M351 Other overlap syndromes: Secondary | ICD-10-CM | POA: Diagnosis not present

## 2022-12-30 DIAGNOSIS — Z7960 Long term (current) use of unspecified immunomodulators and immunosuppressants: Secondary | ICD-10-CM | POA: Diagnosis not present

## 2023-01-02 DIAGNOSIS — J849 Interstitial pulmonary disease, unspecified: Secondary | ICD-10-CM | POA: Diagnosis not present

## 2023-01-06 DIAGNOSIS — I10 Essential (primary) hypertension: Secondary | ICD-10-CM | POA: Diagnosis not present

## 2023-01-06 IMAGING — CR DG CHEST 2V
2 series · 2 of 2 positions shown · non-contrast
Comparison: Chest CT 05/29/2021, PA and lateral chest 05/27/2021

CLINICAL DATA: Weakness with history of pulmonary fibrosis.

EXAM:
CHEST - 2 VIEW

[chest pa]
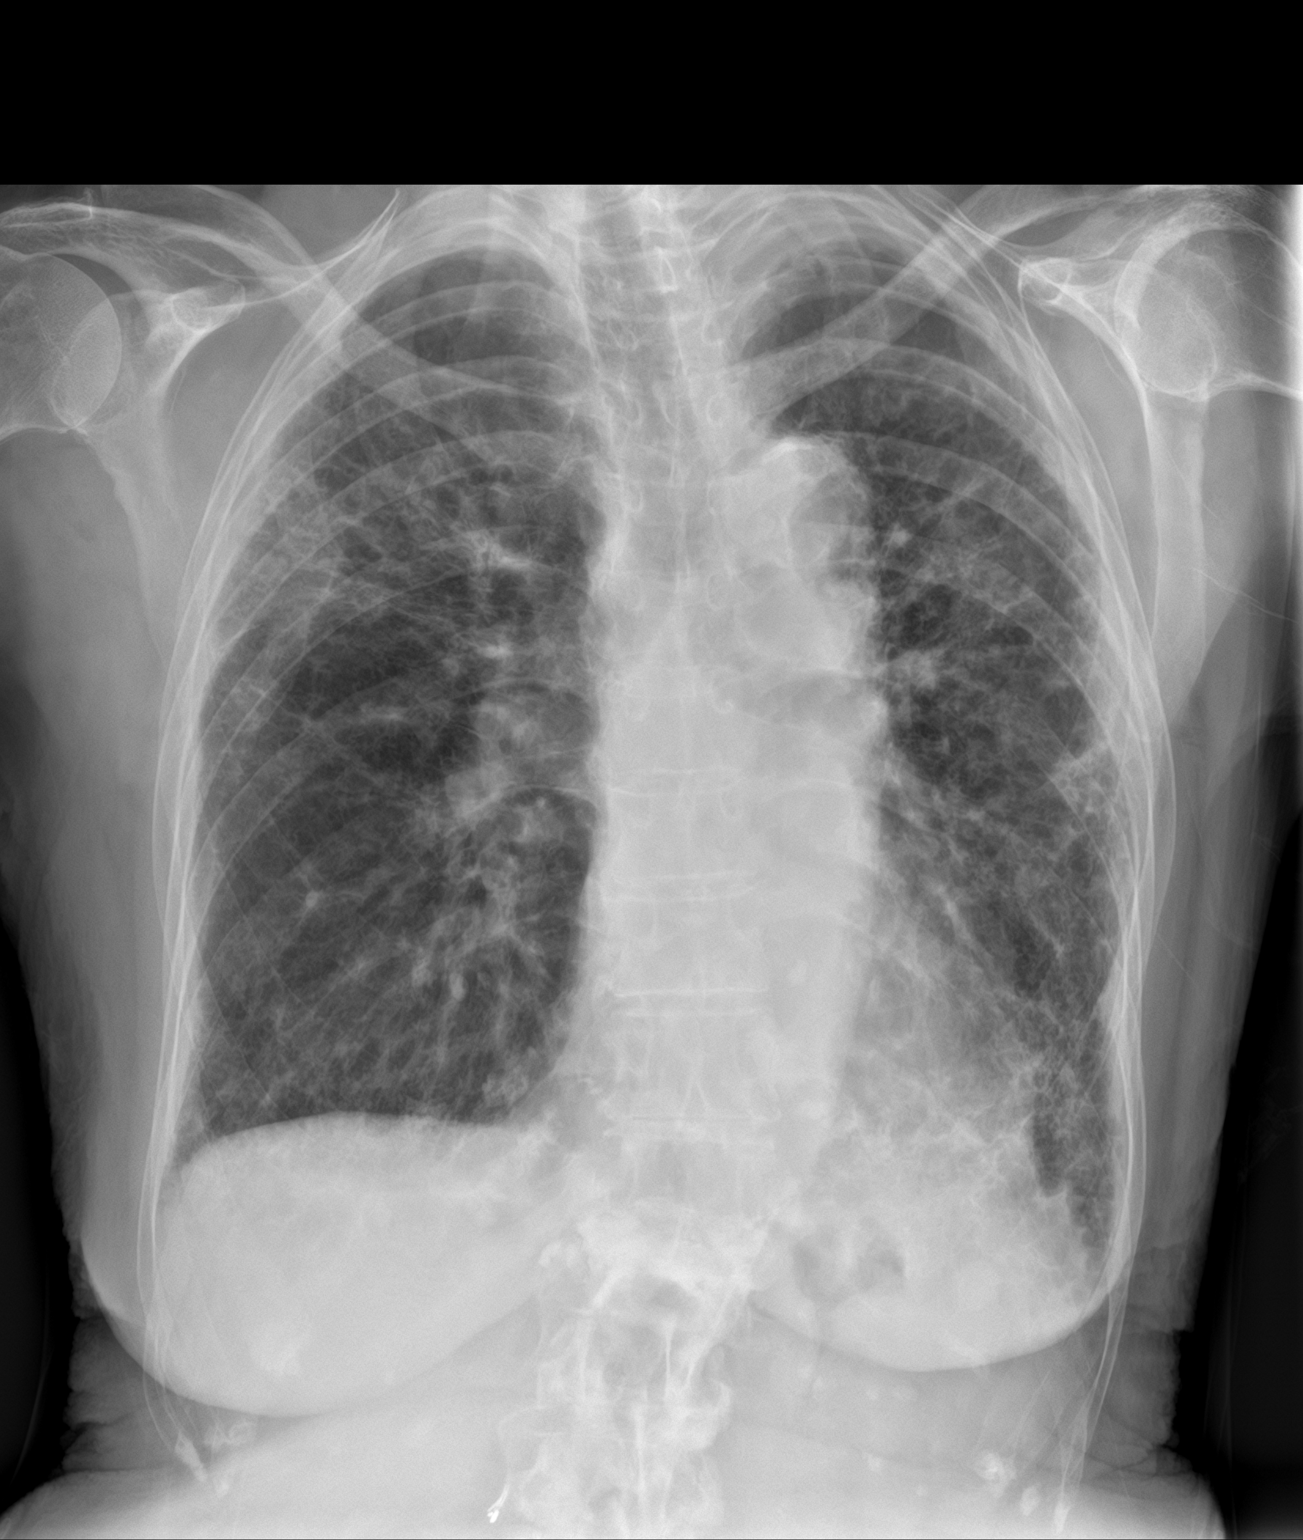

[chest lat]
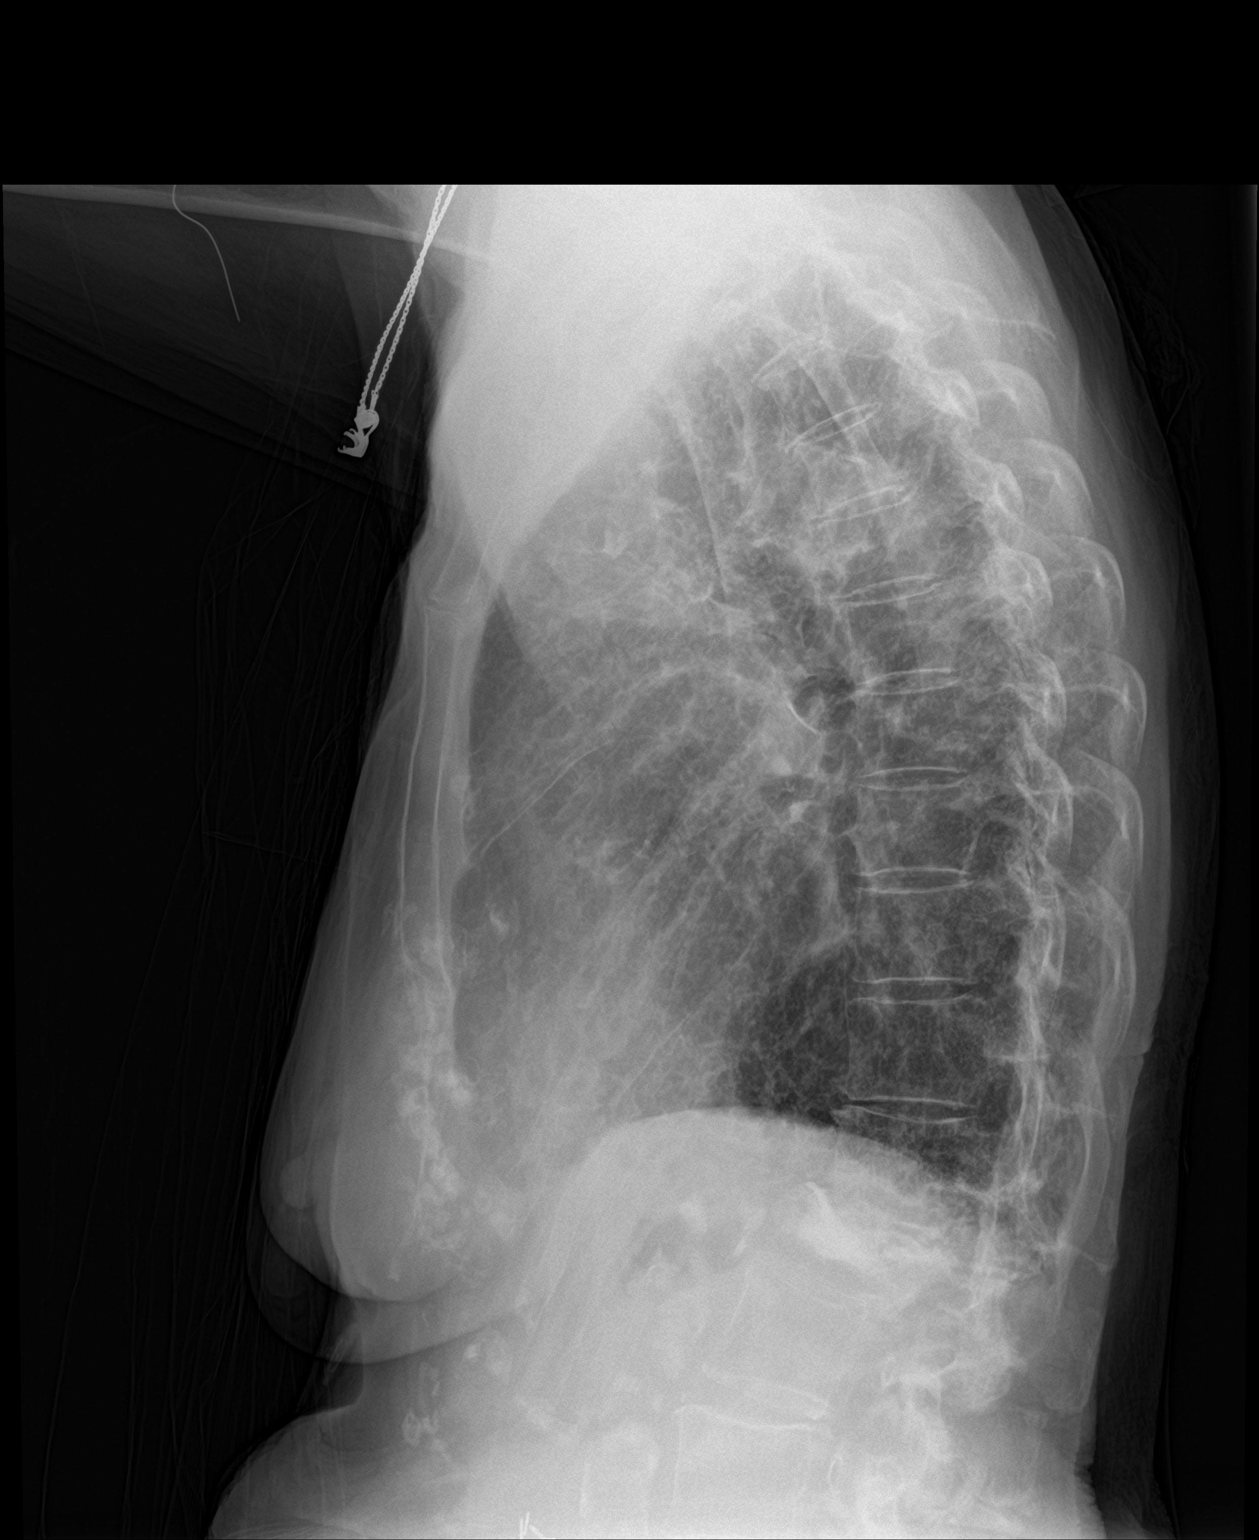

[2 of 2 positions shown; findings below may reference images not displayed]

FINDINGS: The cardiac size is normal. There are calcifications in the aorta
and coronary arteries, with a stable mediastinum. No vascular
congestion is seen.

There is subpleural fibrosis and honeycombing of the lungs with a
lower zonal predominance and including a focal fibrotic
consolidation in the peripheral right upper lobe.

No new infiltrate is seen concerning for pneumonia. Underlying
pulmonary overinflation is again shown. No pleural effusion is
evident. Osteopenia mild thoracic levoscoliosis.

There is a chronic L1 severe compression fracture with retropulsion
and kyphoplasty cement.
IMPRESSION: Grossly stable radiographic changes of pulmonary fibrosis. No new
infiltrate is suspected. Aortic atherosclerosis.

## 2023-01-08 DIAGNOSIS — Z9981 Dependence on supplemental oxygen: Secondary | ICD-10-CM | POA: Diagnosis not present

## 2023-01-08 DIAGNOSIS — J8410 Pulmonary fibrosis, unspecified: Secondary | ICD-10-CM | POA: Diagnosis not present

## 2023-01-08 DIAGNOSIS — J42 Unspecified chronic bronchitis: Secondary | ICD-10-CM | POA: Diagnosis not present

## 2023-01-14 DIAGNOSIS — R002 Palpitations: Secondary | ICD-10-CM | POA: Diagnosis not present

## 2023-01-14 DIAGNOSIS — N1831 Chronic kidney disease, stage 3a: Secondary | ICD-10-CM | POA: Diagnosis not present

## 2023-01-14 DIAGNOSIS — I129 Hypertensive chronic kidney disease with stage 1 through stage 4 chronic kidney disease, or unspecified chronic kidney disease: Secondary | ICD-10-CM | POA: Diagnosis not present

## 2023-01-14 DIAGNOSIS — J439 Emphysema, unspecified: Secondary | ICD-10-CM | POA: Diagnosis not present

## 2023-02-08 DIAGNOSIS — Z9981 Dependence on supplemental oxygen: Secondary | ICD-10-CM | POA: Diagnosis not present

## 2023-02-08 DIAGNOSIS — J42 Unspecified chronic bronchitis: Secondary | ICD-10-CM | POA: Diagnosis not present

## 2023-02-08 DIAGNOSIS — J8410 Pulmonary fibrosis, unspecified: Secondary | ICD-10-CM | POA: Diagnosis not present

## 2023-03-10 DIAGNOSIS — J42 Unspecified chronic bronchitis: Secondary | ICD-10-CM | POA: Diagnosis not present

## 2023-03-10 DIAGNOSIS — Z9981 Dependence on supplemental oxygen: Secondary | ICD-10-CM | POA: Diagnosis not present

## 2023-03-10 DIAGNOSIS — J8410 Pulmonary fibrosis, unspecified: Secondary | ICD-10-CM | POA: Diagnosis not present

## 2023-04-01 DIAGNOSIS — I7300 Raynaud's syndrome without gangrene: Secondary | ICD-10-CM | POA: Diagnosis not present

## 2023-04-01 DIAGNOSIS — M0510 Rheumatoid lung disease with rheumatoid arthritis of unspecified site: Secondary | ICD-10-CM | POA: Diagnosis not present

## 2023-04-01 DIAGNOSIS — M351 Other overlap syndromes: Secondary | ICD-10-CM | POA: Diagnosis not present

## 2023-04-01 DIAGNOSIS — Z79899 Other long term (current) drug therapy: Secondary | ICD-10-CM | POA: Diagnosis not present

## 2023-04-01 DIAGNOSIS — J9611 Chronic respiratory failure with hypoxia: Secondary | ICD-10-CM | POA: Diagnosis not present

## 2023-04-01 DIAGNOSIS — M0579 Rheumatoid arthritis with rheumatoid factor of multiple sites without organ or systems involvement: Secondary | ICD-10-CM | POA: Diagnosis not present

## 2023-04-10 DIAGNOSIS — Z9981 Dependence on supplemental oxygen: Secondary | ICD-10-CM | POA: Diagnosis not present

## 2023-04-10 DIAGNOSIS — J8410 Pulmonary fibrosis, unspecified: Secondary | ICD-10-CM | POA: Diagnosis not present

## 2023-04-10 DIAGNOSIS — J42 Unspecified chronic bronchitis: Secondary | ICD-10-CM | POA: Diagnosis not present

## 2023-04-15 DIAGNOSIS — L03116 Cellulitis of left lower limb: Secondary | ICD-10-CM | POA: Diagnosis not present

## 2023-04-15 DIAGNOSIS — M25512 Pain in left shoulder: Secondary | ICD-10-CM | POA: Diagnosis not present

## 2023-04-15 DIAGNOSIS — S81802A Unspecified open wound, left lower leg, initial encounter: Secondary | ICD-10-CM | POA: Diagnosis not present

## 2023-04-23 ENCOUNTER — Ambulatory Visit: Payer: Medicare Other | Admitting: Dermatology

## 2023-04-24 ENCOUNTER — Encounter: Payer: Self-pay | Admitting: Dermatology

## 2023-04-24 ENCOUNTER — Ambulatory Visit: Payer: Medicare Other | Admitting: Dermatology

## 2023-04-24 VITALS — BP 135/79

## 2023-04-24 DIAGNOSIS — L578 Other skin changes due to chronic exposure to nonionizing radiation: Secondary | ICD-10-CM

## 2023-04-24 DIAGNOSIS — W908XXA Exposure to other nonionizing radiation, initial encounter: Secondary | ICD-10-CM | POA: Diagnosis not present

## 2023-04-24 DIAGNOSIS — L821 Other seborrheic keratosis: Secondary | ICD-10-CM | POA: Diagnosis not present

## 2023-04-24 DIAGNOSIS — L820 Inflamed seborrheic keratosis: Secondary | ICD-10-CM | POA: Diagnosis not present

## 2023-04-24 DIAGNOSIS — L570 Actinic keratosis: Secondary | ICD-10-CM

## 2023-04-24 NOTE — Progress Notes (Signed)
   Follow-Up Visit   Subjective  Morgan Sandoval is a 88 y.o. female who presents for the following: AK 61m f/u  R dorsum hand, ISK 46m f/u L lat wrist, L volar forearm L temple, check spot R tricep, irritating The patient has spots, moles and lesions to be evaluated, some may be new or changing and the patient may have concern these could be cancer.  The following portions of the chart were reviewed this encounter and updated as appropriate: medications, allergies, medical history  Review of Systems:  No other skin or systemic complaints except as noted in HPI or Assessment and Plan.  Objective  Well appearing patient in no apparent distress; mood and affect are within normal limits. A focused examination was performed of the following areas: Face, hands, arms Relevant exam findings are noted in the Assessment and Plan.  R dorsum hand x 1 Pink scaly macules  L lat wrist x 1, L temple x 1, L volar forearm x 1, R tricep x 1 (4) stuck on waxy paps with erythema   Assessment & Plan   SEBORRHEIC KERATOSIS - Stuck-on, waxy, tan-brown papules and/or plaques  - Benign-appearing - Discussed benign etiology and prognosis. - Observe - Call for any changes  ACTINIC DAMAGE - chronic, secondary to cumulative UV radiation exposure/sun exposure over time - diffuse scaly erythematous macules with underlying dyspigmentation - Recommend daily broad spectrum sunscreen SPF 30+ to sun-exposed areas, reapply every 2 hours as needed.  - Recommend staying in the shade or wearing long sleeves, sun glasses (UVA+UVB protection) and wide brim hats (4-inch brim around the entire circumference of the hat). - Call for new or changing lesions.   AK (actinic keratosis) R dorsum hand x 1  Destruction of lesion - R dorsum hand x 1 Complexity: simple   Destruction method: cryotherapy   Informed consent: discussed and consent obtained   Timeout:  patient name, date of birth, surgical site, and procedure  verified Lesion destroyed using liquid nitrogen: Yes   Region frozen until ice ball extended beyond lesion: Yes   Outcome: patient tolerated procedure well with no complications   Post-procedure details: wound care instructions given    Inflamed seborrheic keratosis (4) L lat wrist x 1, L temple x 1, L volar forearm x 1, R tricep x 1  Symptomatic, irritating, patient would like treated.   Destruction of lesion - L lat wrist x 1, L temple x 1, L volar forearm x 1, R tricep x 1 (4) Complexity: simple   Destruction method: cryotherapy   Informed consent: discussed and consent obtained   Timeout:  patient name, date of birth, surgical site, and procedure verified Lesion destroyed using liquid nitrogen: Yes   Region frozen until ice ball extended beyond lesion: Yes   Outcome: patient tolerated procedure well with no complications   Post-procedure details: wound care instructions given     Return in about 8 months (around 12/23/2023) for Hx of AKs, ISK f/u.  I, Ardis Rowan, RMA, am acting as scribe for Armida Sans, MD .  Documentation: I have reviewed the above documentation for accuracy and completeness, and I agree with the above.  Armida Sans, MD

## 2023-04-24 NOTE — Patient Instructions (Addendum)
Cryotherapy Aftercare  Wash gently with soap and water everyday.   Apply Vaseline and Band-Aid daily until healed.     Due to recent changes in healthcare laws, you may see results of your pathology and/or laboratory studies on MyChart before the doctors have had a chance to review them. We understand that in some cases there may be results that are confusing or concerning to you. Please understand that not all results are received at the same time and often the doctors may need to interpret multiple results in order to provide you with the best plan of care or course of treatment. Therefore, we ask that you please give us 2 business days to thoroughly review all your results before contacting the office for clarification. Should we see a critical lab result, you will be contacted sooner.   If You Need Anything After Your Visit  If you have any questions or concerns for your doctor, please call our main line at [phone] and press option 4 to reach your doctor's medical assistant. If no one answers, please leave a voicemail as directed and we will return your call as soon as possible. Messages left after 4 pm will be answered the following business day.   You may also send us a message via MyChart. We typically respond to MyChart messages within 1-2 business days.  For prescription refills, please ask your pharmacy to contact our office. Our fax number is [phone].  If you have an urgent issue when the clinic is closed that cannot wait until the next business day, you can page your doctor at the number below.    Please note that while we do our best to be available for urgent issues outside of office hours, we are not available 24/7.   If you have an urgent issue and are unable to reach us, you may choose to seek medical care at your doctor's office, retail clinic, urgent care center, or emergency room.  If you have a medical emergency, please immediately call 911 or go to the  emergency department.  Pager Numbers  - Dr. Kowalski: [phone]  - Dr. Moye: [phone]  - Dr. Stewart: [phone]  In the event of inclement weather, please call our main line at [phone] for an update on the status of any delays or closures.  Dermatology Medication Tips: Please keep the boxes that topical medications come in in order to help keep track of the instructions about where and how to use these. Pharmacies typically print the medication instructions only on the boxes and not directly on the medication tubes.   If your medication is too expensive, please contact our office at [phone] option 4 or send us a message through MyChart.   We are unable to tell what your co-pay for medications will be in advance as this is different depending on your insurance coverage. However, we may be able to find a substitute medication at lower cost or fill out paperwork to get insurance to cover a needed medication.   If a prior authorization is required to get your medication covered by your insurance company, please allow us 1-2 business days to complete this process.  Drug prices often vary depending on where the prescription is filled and some pharmacies may offer cheaper prices.  The website www.goodrx.com contains coupons for medications through different pharmacies. The prices here do not account for what the cost may be with help from insurance (it may be cheaper with your insurance), but the website can   give you the price if you did not use any insurance.  - You can print the associated coupon and take it with your prescription to the pharmacy.  - You may also stop by our office during regular business hours and pick up a GoodRx coupon card.  - If you need your prescription sent electronically to a different pharmacy, notify our office through [place] [facility] MyChart or by phone at [phone] option 4.     Si Usted Necesita Algo Despus de Su Visita  Tambin puede  enviarnos un mensaje a travs de MyChart. Por lo general respondemos a los mensajes de MyChart en el transcurso de 1 a 2 das hbiles.  Para renovar recetas, por favor pida a su farmacia que se ponga en contacto con nuestra oficina. Nuestro nmero de fax es el [phone].  Si tiene un asunto urgente cuando la clnica est cerrada y que no puede esperar hasta el siguiente da hbil, puede llamar/localizar a su doctor(a) al nmero que aparece a continuacin.   Por favor, tenga en cuenta que aunque hacemos todo lo posible para estar disponibles para asuntos urgentes fuera del horario de oficina, no estamos disponibles las 24 horas del da, los 7 das de la semana.   Si tiene un problema urgente y no puede comunicarse con nosotros, puede optar por buscar atencin mdica  en el consultorio de su doctor(a), en una clnica privada, en un centro de atencin urgente o en una sala de emergencias.  Si tiene una emergencia mdica, por favor llame inmediatamente al 911 o vaya a la sala de emergencias.  Nmeros de bper  - Dr. Kowalski: [phone]  - Dra. Moye: [phone]  - Dra. Stewart: [phone]  En caso de inclemencias del tiempo, por favor llame a nuestra lnea principal al [phone] para una actualizacin sobre el estado de cualquier retraso o cierre.  Consejos para la medicacin en dermatologa: Por favor, guarde las cajas en las que vienen los medicamentos de uso tpico para ayudarle a seguir las instrucciones sobre dnde y cmo usarlos. Las farmacias generalmente imprimen las instrucciones del medicamento slo en las cajas y no directamente en los tubos del medicamento.   Si su medicamento es muy caro, por favor, pngase en contacto con nuestra oficina llamando al [phone] y presione la opcin 4 o envenos un mensaje a travs de MyChart.   No podemos decirle cul ser su copago por los medicamentos por adelantado ya que esto es diferente dependiendo de la cobertura de su seguro.  Sin embargo, es posible que podamos encontrar un medicamento sustituto a menor costo o llenar un formulario para que el seguro cubra el medicamento que se considera necesario.   Si se requiere una autorizacin previa para que su compaa de seguros cubra su medicamento, por favor permtanos de 1 a 2 das hbiles para completar este proceso.  Los precios de los medicamentos varan con frecuencia dependiendo del lugar de dnde se surte la receta y alguna farmacias pueden ofrecer precios ms baratos.  El sitio web www.goodrx.com tiene cupones para medicamentos de diferentes farmacias. Los precios aqu no tienen en cuenta lo que podra costar con la ayuda del seguro (puede ser ms barato con su seguro), pero el sitio web puede darle el precio si no utiliz ningn seguro.  - Puede imprimir el cupn correspondiente y llevarlo con su receta a la farmacia.  - Tambin puede pasar por nuestra oficina durante el horario de atencin regular y recoger una tarjeta de cupones de GoodRx.  -   Si necesita que su receta se enve electrnicamente a una farmacia diferente, informe a nuestra oficina a travs de MyChart de [place] o por telfono llamando al [phone] y presione la opcin 4.  

## 2023-04-25 ENCOUNTER — Encounter: Payer: Self-pay | Admitting: Dermatology

## 2023-05-05 DIAGNOSIS — S46212A Strain of muscle, fascia and tendon of other parts of biceps, left arm, initial encounter: Secondary | ICD-10-CM | POA: Diagnosis not present

## 2023-05-05 DIAGNOSIS — S81802A Unspecified open wound, left lower leg, initial encounter: Secondary | ICD-10-CM | POA: Diagnosis not present

## 2023-05-06 DIAGNOSIS — I10 Essential (primary) hypertension: Secondary | ICD-10-CM | POA: Diagnosis not present

## 2023-05-06 DIAGNOSIS — Z136 Encounter for screening for cardiovascular disorders: Secondary | ICD-10-CM | POA: Diagnosis not present

## 2023-05-10 DIAGNOSIS — J8410 Pulmonary fibrosis, unspecified: Secondary | ICD-10-CM | POA: Diagnosis not present

## 2023-05-10 DIAGNOSIS — Z9981 Dependence on supplemental oxygen: Secondary | ICD-10-CM | POA: Diagnosis not present

## 2023-05-10 DIAGNOSIS — J42 Unspecified chronic bronchitis: Secondary | ICD-10-CM | POA: Diagnosis not present

## 2023-05-13 DIAGNOSIS — Z87898 Personal history of other specified conditions: Secondary | ICD-10-CM | POA: Diagnosis not present

## 2023-05-13 DIAGNOSIS — I10 Essential (primary) hypertension: Secondary | ICD-10-CM | POA: Diagnosis not present

## 2023-05-13 DIAGNOSIS — J439 Emphysema, unspecified: Secondary | ICD-10-CM | POA: Diagnosis not present

## 2023-05-13 DIAGNOSIS — Z862 Personal history of diseases of the blood and blood-forming organs and certain disorders involving the immune mechanism: Secondary | ICD-10-CM | POA: Diagnosis not present

## 2023-05-13 DIAGNOSIS — E871 Hypo-osmolality and hyponatremia: Secondary | ICD-10-CM | POA: Diagnosis not present

## 2023-05-15 DIAGNOSIS — R55 Syncope and collapse: Secondary | ICD-10-CM | POA: Diagnosis not present

## 2023-05-15 DIAGNOSIS — S46212A Strain of muscle, fascia and tendon of other parts of biceps, left arm, initial encounter: Secondary | ICD-10-CM | POA: Diagnosis not present

## 2023-05-15 DIAGNOSIS — R278 Other lack of coordination: Secondary | ICD-10-CM | POA: Diagnosis not present

## 2023-05-15 DIAGNOSIS — M069 Rheumatoid arthritis, unspecified: Secondary | ICD-10-CM | POA: Diagnosis not present

## 2023-05-15 DIAGNOSIS — Z741 Need for assistance with personal care: Secondary | ICD-10-CM | POA: Diagnosis not present

## 2023-05-15 DIAGNOSIS — M6259 Muscle wasting and atrophy, not elsewhere classified, multiple sites: Secondary | ICD-10-CM | POA: Diagnosis not present

## 2023-05-15 DIAGNOSIS — R262 Difficulty in walking, not elsewhere classified: Secondary | ICD-10-CM | POA: Diagnosis not present

## 2023-05-15 DIAGNOSIS — M79602 Pain in left arm: Secondary | ICD-10-CM | POA: Diagnosis not present

## 2023-05-15 DIAGNOSIS — I214 Non-ST elevation (NSTEMI) myocardial infarction: Secondary | ICD-10-CM | POA: Diagnosis not present

## 2023-05-15 DIAGNOSIS — I129 Hypertensive chronic kidney disease with stage 1 through stage 4 chronic kidney disease, or unspecified chronic kidney disease: Secondary | ICD-10-CM | POA: Diagnosis not present

## 2023-05-15 DIAGNOSIS — M6281 Muscle weakness (generalized): Secondary | ICD-10-CM | POA: Diagnosis not present

## 2023-05-15 DIAGNOSIS — I7300 Raynaud's syndrome without gangrene: Secondary | ICD-10-CM | POA: Diagnosis not present

## 2023-05-15 DIAGNOSIS — Z9181 History of falling: Secondary | ICD-10-CM | POA: Diagnosis not present

## 2023-05-15 DIAGNOSIS — M810 Age-related osteoporosis without current pathological fracture: Secondary | ICD-10-CM | POA: Diagnosis not present

## 2023-05-15 DIAGNOSIS — J42 Unspecified chronic bronchitis: Secondary | ICD-10-CM | POA: Diagnosis not present

## 2023-05-15 DIAGNOSIS — S42215D Unspecified nondisplaced fracture of surgical neck of left humerus, subsequent encounter for fracture with routine healing: Secondary | ICD-10-CM | POA: Diagnosis not present

## 2023-05-15 DIAGNOSIS — Z9981 Dependence on supplemental oxygen: Secondary | ICD-10-CM | POA: Diagnosis not present

## 2023-05-15 DIAGNOSIS — M0510 Rheumatoid lung disease with rheumatoid arthritis of unspecified site: Secondary | ICD-10-CM | POA: Diagnosis not present

## 2023-05-15 DIAGNOSIS — J8410 Pulmonary fibrosis, unspecified: Secondary | ICD-10-CM | POA: Diagnosis not present

## 2023-05-15 DIAGNOSIS — M48061 Spinal stenosis, lumbar region without neurogenic claudication: Secondary | ICD-10-CM | POA: Diagnosis not present

## 2023-05-19 DIAGNOSIS — I7300 Raynaud's syndrome without gangrene: Secondary | ICD-10-CM | POA: Diagnosis not present

## 2023-05-19 DIAGNOSIS — M48061 Spinal stenosis, lumbar region without neurogenic claudication: Secondary | ICD-10-CM | POA: Diagnosis not present

## 2023-05-19 DIAGNOSIS — M6281 Muscle weakness (generalized): Secondary | ICD-10-CM | POA: Diagnosis not present

## 2023-05-19 DIAGNOSIS — R55 Syncope and collapse: Secondary | ICD-10-CM | POA: Diagnosis not present

## 2023-05-19 DIAGNOSIS — Z741 Need for assistance with personal care: Secondary | ICD-10-CM | POA: Diagnosis not present

## 2023-05-19 DIAGNOSIS — J42 Unspecified chronic bronchitis: Secondary | ICD-10-CM | POA: Diagnosis not present

## 2023-05-19 DIAGNOSIS — M0510 Rheumatoid lung disease with rheumatoid arthritis of unspecified site: Secondary | ICD-10-CM | POA: Diagnosis not present

## 2023-05-19 DIAGNOSIS — R262 Difficulty in walking, not elsewhere classified: Secondary | ICD-10-CM | POA: Diagnosis not present

## 2023-05-19 DIAGNOSIS — M810 Age-related osteoporosis without current pathological fracture: Secondary | ICD-10-CM | POA: Diagnosis not present

## 2023-05-19 DIAGNOSIS — M069 Rheumatoid arthritis, unspecified: Secondary | ICD-10-CM | POA: Diagnosis not present

## 2023-05-19 DIAGNOSIS — M79602 Pain in left arm: Secondary | ICD-10-CM | POA: Diagnosis not present

## 2023-05-19 DIAGNOSIS — I129 Hypertensive chronic kidney disease with stage 1 through stage 4 chronic kidney disease, or unspecified chronic kidney disease: Secondary | ICD-10-CM | POA: Diagnosis not present

## 2023-05-19 DIAGNOSIS — Z9981 Dependence on supplemental oxygen: Secondary | ICD-10-CM | POA: Diagnosis not present

## 2023-05-19 DIAGNOSIS — M6259 Muscle wasting and atrophy, not elsewhere classified, multiple sites: Secondary | ICD-10-CM | POA: Diagnosis not present

## 2023-05-19 DIAGNOSIS — J8410 Pulmonary fibrosis, unspecified: Secondary | ICD-10-CM | POA: Diagnosis not present

## 2023-05-19 DIAGNOSIS — R278 Other lack of coordination: Secondary | ICD-10-CM | POA: Diagnosis not present

## 2023-05-19 DIAGNOSIS — I214 Non-ST elevation (NSTEMI) myocardial infarction: Secondary | ICD-10-CM | POA: Diagnosis not present

## 2023-05-19 DIAGNOSIS — S46212A Strain of muscle, fascia and tendon of other parts of biceps, left arm, initial encounter: Secondary | ICD-10-CM | POA: Diagnosis not present

## 2023-05-19 DIAGNOSIS — S42215D Unspecified nondisplaced fracture of surgical neck of left humerus, subsequent encounter for fracture with routine healing: Secondary | ICD-10-CM | POA: Diagnosis not present

## 2023-05-19 DIAGNOSIS — Z9181 History of falling: Secondary | ICD-10-CM | POA: Diagnosis not present

## 2023-05-27 DIAGNOSIS — M5414 Radiculopathy, thoracic region: Secondary | ICD-10-CM | POA: Diagnosis not present

## 2023-05-27 DIAGNOSIS — M546 Pain in thoracic spine: Secondary | ICD-10-CM | POA: Diagnosis not present

## 2023-05-27 DIAGNOSIS — G8929 Other chronic pain: Secondary | ICD-10-CM | POA: Diagnosis not present

## 2023-05-28 ENCOUNTER — Other Ambulatory Visit: Payer: Self-pay | Admitting: Physical Medicine & Rehabilitation

## 2023-05-28 DIAGNOSIS — M5414 Radiculopathy, thoracic region: Secondary | ICD-10-CM

## 2023-06-03 ENCOUNTER — Other Ambulatory Visit: Payer: Self-pay | Admitting: Physical Medicine & Rehabilitation

## 2023-06-03 ENCOUNTER — Ambulatory Visit
Admission: RE | Admit: 2023-06-03 | Discharge: 2023-06-03 | Disposition: A | Discharge status: Home / Self Care | Payer: Medicare Other | Source: Ambulatory Visit | Attending: Physical Medicine & Rehabilitation | Admitting: Physical Medicine & Rehabilitation

## 2023-06-03 DIAGNOSIS — M5414 Radiculopathy, thoracic region: Secondary | ICD-10-CM

## 2023-06-03 DIAGNOSIS — M4803 Spinal stenosis, cervicothoracic region: Secondary | ICD-10-CM | POA: Diagnosis not present

## 2023-06-03 DIAGNOSIS — M47813 Spondylosis without myelopathy or radiculopathy, cervicothoracic region: Secondary | ICD-10-CM | POA: Diagnosis not present

## 2023-06-03 DIAGNOSIS — M40204 Unspecified kyphosis, thoracic region: Secondary | ICD-10-CM | POA: Diagnosis not present

## 2023-06-03 DIAGNOSIS — M4854XA Collapsed vertebra, not elsewhere classified, thoracic region, initial encounter for fracture: Secondary | ICD-10-CM | POA: Diagnosis not present

## 2023-06-10 DIAGNOSIS — J8410 Pulmonary fibrosis, unspecified: Secondary | ICD-10-CM | POA: Diagnosis not present

## 2023-06-10 DIAGNOSIS — G8929 Other chronic pain: Secondary | ICD-10-CM | POA: Diagnosis not present

## 2023-06-10 DIAGNOSIS — J42 Unspecified chronic bronchitis: Secondary | ICD-10-CM | POA: Diagnosis not present

## 2023-06-10 DIAGNOSIS — M546 Pain in thoracic spine: Secondary | ICD-10-CM | POA: Diagnosis not present

## 2023-06-10 DIAGNOSIS — Z9981 Dependence on supplemental oxygen: Secondary | ICD-10-CM | POA: Diagnosis not present

## 2023-06-10 DIAGNOSIS — R918 Other nonspecific abnormal finding of lung field: Secondary | ICD-10-CM | POA: Diagnosis not present

## 2023-06-10 DIAGNOSIS — M5414 Radiculopathy, thoracic region: Secondary | ICD-10-CM | POA: Diagnosis not present

## 2023-06-13 DIAGNOSIS — S46212A Strain of muscle, fascia and tendon of other parts of biceps, left arm, initial encounter: Secondary | ICD-10-CM | POA: Diagnosis not present

## 2023-06-13 DIAGNOSIS — S81802A Unspecified open wound, left lower leg, initial encounter: Secondary | ICD-10-CM | POA: Diagnosis not present

## 2023-06-13 DIAGNOSIS — R918 Other nonspecific abnormal finding of lung field: Secondary | ICD-10-CM | POA: Diagnosis not present

## 2023-06-14 ENCOUNTER — Other Ambulatory Visit: Payer: Medicare Other

## 2023-06-17 ENCOUNTER — Encounter: Payer: Self-pay | Admitting: Family Medicine

## 2023-06-19 ENCOUNTER — Other Ambulatory Visit: Payer: Self-pay | Admitting: Family Medicine

## 2023-06-19 DIAGNOSIS — R918 Other nonspecific abnormal finding of lung field: Secondary | ICD-10-CM

## 2023-06-20 ENCOUNTER — Ambulatory Visit
Admission: RE | Admit: 2023-06-20 | Discharge: 2023-06-20 | Disposition: A | Discharge status: Home / Self Care | Payer: Medicare Other | Source: Ambulatory Visit | Attending: Family Medicine | Admitting: Family Medicine

## 2023-06-20 DIAGNOSIS — R590 Localized enlarged lymph nodes: Secondary | ICD-10-CM | POA: Diagnosis not present

## 2023-06-20 DIAGNOSIS — R918 Other nonspecific abnormal finding of lung field: Secondary | ICD-10-CM | POA: Insufficient documentation

## 2023-06-20 DIAGNOSIS — J8410 Pulmonary fibrosis, unspecified: Secondary | ICD-10-CM | POA: Diagnosis not present

## 2023-06-20 DIAGNOSIS — J479 Bronchiectasis, uncomplicated: Secondary | ICD-10-CM | POA: Diagnosis not present

## 2023-06-20 MED ORDER — IOHEXOL 300 MG/ML  SOLN
75.0000 mL | Freq: Once | INTRAMUSCULAR | Status: AC | PRN
  Administered 2023-06-20: 75 mL via INTRAVENOUS

## 2023-06-23 NOTE — Group Note (Deleted)

## 2023-07-02 DIAGNOSIS — R918 Other nonspecific abnormal finding of lung field: Secondary | ICD-10-CM | POA: Diagnosis not present

## 2023-07-07 DIAGNOSIS — M0579 Rheumatoid arthritis with rheumatoid factor of multiple sites without organ or systems involvement: Secondary | ICD-10-CM | POA: Diagnosis not present

## 2023-07-07 DIAGNOSIS — M351 Other overlap syndromes: Secondary | ICD-10-CM | POA: Diagnosis not present

## 2023-07-07 DIAGNOSIS — I7300 Raynaud's syndrome without gangrene: Secondary | ICD-10-CM | POA: Diagnosis not present

## 2023-07-07 DIAGNOSIS — R918 Other nonspecific abnormal finding of lung field: Secondary | ICD-10-CM | POA: Diagnosis not present

## 2023-07-07 DIAGNOSIS — Z7960 Long term (current) use of unspecified immunomodulators and immunosuppressants: Secondary | ICD-10-CM | POA: Diagnosis not present

## 2023-07-08 ENCOUNTER — Inpatient Hospital Stay: Payer: Medicare Other | Attending: Internal Medicine | Admitting: Internal Medicine

## 2023-07-08 ENCOUNTER — Encounter: Payer: Self-pay | Admitting: *Deleted

## 2023-07-08 ENCOUNTER — Inpatient Hospital Stay: Payer: Medicare Other

## 2023-07-08 VITALS — BP 138/72 | HR 65 | Resp 18 | Ht 63.0 in | Wt 103.0 lb

## 2023-07-08 DIAGNOSIS — M069 Rheumatoid arthritis, unspecified: Secondary | ICD-10-CM | POA: Diagnosis not present

## 2023-07-08 DIAGNOSIS — Z79899 Other long term (current) drug therapy: Secondary | ICD-10-CM | POA: Insufficient documentation

## 2023-07-08 DIAGNOSIS — J849 Interstitial pulmonary disease, unspecified: Secondary | ICD-10-CM | POA: Diagnosis not present

## 2023-07-08 DIAGNOSIS — R918 Other nonspecific abnormal finding of lung field: Secondary | ICD-10-CM | POA: Diagnosis not present

## 2023-07-08 DIAGNOSIS — Z9981 Dependence on supplemental oxygen: Secondary | ICD-10-CM | POA: Diagnosis not present

## 2023-07-08 DIAGNOSIS — Z87891 Personal history of nicotine dependence: Secondary | ICD-10-CM | POA: Diagnosis not present

## 2023-07-08 NOTE — Progress Notes (Signed)
Patient is very hard of hearing.

## 2023-07-08 NOTE — Progress Notes (Signed)
Met with patient during initial consult with Dr. Donneta Romberg. All questions answered during visit. Reviewed upcoming appts and informed will call with other appts later this afternoon. Contact info given and instructed to call with any questions or needs. Pt verbalized understanding.

## 2023-07-08 NOTE — Assessment & Plan Note (Addendum)
#   SEP 2024- The lesion in the left lower lobe has markedly enlarged. The left lung mass measures up to 5.8 cm [NOV 2024- CT-approximately 2 cm]. In addition, there is evidence for left hilar lymphadenopathy. Findings are suggestive for a primary lung cancer.  New compression deformity involving the T6 vertebral body. Old compression deformity involving T7 with sclerosis. Difficult to exclude pathologic compression fractures.  # As per pulmonary/patient poor candidate for any general anesthesia for biopsy.  Recommended PET scan for further evaluation.   # I had a long discussion with the patient and her friend regarding my significant concerns for malignancy based on imaging and the progression as noted on the scan.  Wait on PET scan for staging purposes.  Will also review the tumor conference.  # Discussed this patient a poor candidate for any surgical options.  Also poor candidate for any systemic chemotherapy/ ?  Immunotherapy option.  Discussed option of radiation-however radiation could still be most likely palliative given nodal disease; and also pleural fluid noted.  Will make a referral to radiation oncology.  # chronic ILD-hx of smoking [on home O2 lit]-Dr.A- overall stable.   # Hx of RA- on plaquenil [Dr.Patel]  Thank you Dr.Aleskerov for allowing me to participate in the care of your pleasant patient. Please do not hesitate to contact me with questions or concerns in the interim.  Daughter out of country-recommend call us and speak to [facility] if any questions about the plan.   # I reviewed the blood work- with the patient in detail; also reviewed the imaging independently [as summarized above]; and with the patient in detail.   # DISPOSITION: # no labs- # PET ASAP # referral to Dr.Chrystal  # follow up in 2 weeks-MD; no labs- Dr.B  # I reviewed the blood work- with the patient in detail; also reviewed the imaging independently [as summarized above]; and with the patient in detail.

## 2023-07-08 NOTE — Progress Notes (Signed)
[facility] CONSULT NOTE  Patient Care Team: Marisue Ivan, MD as PCP - General (Family Medicine) Earna Coder, MD as Consulting Physician (Oncology)  CHIEF COMPLAINTS/PURPOSE OF CONSULTATION: lung mass /lung cancer  Oncology History   No history exists.    HISTORY OF PRESENTING ILLNESS: Patient ambulating-in wheel chair.  Accompanied by friend.   Layli Kandis Fantasia 88 y.o.  female with a history of smoking; and Hx of Pulmonary fibrosis- on O2 , RA is here for further evaluation and recommendations for lung cancer/lung mass.   Patient was recently evaluated for worsening back pain- seen by ortho re: compression facture.  Patient had MRI that showed the compression fractures however noted to have lung mass incidentally on imaging.  And then patient had  evaluation with Dr.aleskerov-and also had a CT scan.   Unfortunately patient CT scans were progressive left lower lung mass and also hilar adenopathy.  Patient has been referred to oncology for further recommendations.  Patient notes to have worsening cough at nighttime.  Otherwise no worsening shortness of breath.  She continues to be on oxygen.  Unfortunately continues to have back pain.   Review of Systems  Constitutional:  Positive for malaise/fatigue and weight loss. Negative for chills, diaphoresis and fever.  HENT:  Negative for nosebleeds and sore throat.   Eyes:  Negative for double vision.  Respiratory:  Positive for cough and sputum production. Negative for hemoptysis, shortness of breath and wheezing.   Cardiovascular:  Negative for chest pain, palpitations, orthopnea and leg swelling.  Gastrointestinal:  Negative for abdominal pain, blood in stool, constipation, diarrhea, heartburn, melena, nausea and vomiting.  Genitourinary:  Negative for dysuria, frequency and urgency.  Musculoskeletal:  Positive for back pain and joint pain.  Skin: Negative.  Negative for itching and rash.  Neurological:   Negative for dizziness, tingling, focal weakness, weakness and headaches.  Endo/Heme/Allergies:  Does not bruise/bleed easily.  Psychiatric/Behavioral:  Negative for depression. The patient is not nervous/anxious and does not have insomnia.     MEDICAL HISTORY:  Past Medical History:  Diagnosis Date   Actinic keratosis    Cholelithiasis    Chronic diarrhea    Colon polyp    Diverticulosis    GERD (gastroesophageal reflux disease)    if she eats acidic foods   Hypertension    Mucoid cyst of joint    left index finger   Osteoporosis     SURGICAL HISTORY: Past Surgical History:  Procedure Laterality Date   APPENDECTOMY     BIOPSY  05/19/2019   Procedure: BIOPSY;  Surgeon: Malissa Hippo, MD;  Location: [facility];  Service: Endoscopy;;  sigmoid colon   CHOLECYSTECTOMY     COLON SURGERY     Removed large polyps   COLONOSCOPY     COLONOSCOPY N/A 05/18/2015   Procedure: COLONOSCOPY;  Surgeon: Malissa Hippo, MD;  Location: [facility];  Service: Endoscopy;  Laterality: N/A;  1030   COLONOSCOPY N/A 05/19/2019   Procedure: COLONOSCOPY;  Surgeon: Malissa Hippo, MD;  Location: [facility];  Service: Endoscopy;  Laterality: N/A;  8:30   GANGLION CYST EXCISION Left 03/2019   the fore finger   IR VERTEBROPLASTY LUMBAR BX INC UNI/BIL INC/INJECT/IMAGING  07/27/2020   MASS EXCISION Left 03/02/2019   Procedure: EXCISION CYST, DEBRIDEMENT PROXIMAL INTERPHALANGEAL JOINT LEFT INDEX FINGER;  Surgeon: Cindee Salt, MD;  Location: [facility];  Service: Orthopedics;  Laterality: Left;   RECTAL SURGERY  [facility]  [facility]   TONSILLECTOMY      SOCIAL HISTORY: Social History   Socioeconomic History   Marital status: Widowed    Spouse name: Not on file   Number of children: Not on file   Years of education: Not on file   Highest education level: Not on file  Occupational History   Not on file  Tobacco Use   Smoking status: Former   Smokeless tobacco: Never   Vaping Use   Vaping status: Never Used  Substance and Sexual Activity   Alcohol use: Yes    Comment: occ   Drug use: No   Sexual activity: Not on file  Other Topics Concern   Not on file  Social History Narrative   Not on file   Social Determinants of Health   Financial Resource Strain: Low Risk  (05/13/2023)   Received from [facility]   Overall Financial Resource Strain (CARDIA)    Difficulty of Paying Living Expenses: Not hard at all  Food Insecurity: No Food Insecurity (07/08/2023)   Hunger Vital Sign    Worried About Running Out of Food in the Last Year: Never true    Ran Out of Food in the Last Year: Never true  Transportation Needs: Unknown (07/08/2023)   PRAPARE - Administrator, Civil Service (Medical): No    Lack of Transportation (Non-Medical): Not on file  Physical Activity: Not on file  Stress: Not on file  Social Connections: Not on file  Intimate Partner Violence: Not At Risk (07/08/2023)   Humiliation, Afraid, Rape, and Kick questionnaire    Fear of Current or Ex-Partner: No    Emotionally Abused: No    Physically Abused: No    Sexually Abused: No    FAMILY HISTORY: Family History  Problem Relation Age of Onset   Cancer - Colon Brother     ALLERGIES:  is allergic to hydrochlorothiazide, nifedipine, and sulfa antibiotics.  MEDICATIONS:  Current Outpatient Medications  Medication Sig Dispense Refill   albuterol (VENTOLIN HFA) 108 (90 Base) MCG/ACT inhaler Inhale into the lungs.     alendronate (FOSAMAX) 70 MG tablet Take 70 mg by mouth every Sunday.     azithromycin (ZITHROMAX) 250 MG tablet Take 250 mg by mouth 3 (three) times a week.     carvedilol (COREG) 3.125 MG tablet Take 3.125 mg by mouth 2 (two) times daily.     cholecalciferol (VITAMIN D3) 25 MCG (1000 UNIT) tablet Take 1,000 Units by mouth daily.     Cyanocobalamin (B-12) 5000 MCG CAPS Take 5,000 mcg by mouth daily.     diclofenac sodium (VOLTAREN) 1 % GEL Apply  2 g topically 4 (four) times daily.     hydroxychloroquine (PLAQUENIL) 200 MG tablet Take 200 mg by mouth daily.     ipratropium (ATROVENT) 0.06 % nasal spray Place into the nose.     lisinopril (ZESTRIL) 20 MG tablet Take 1 tablet (20 mg total) by mouth daily.     Multiple Vitamins-Minerals (PRESERVISION AREDS 2+MULTI VIT) CAPS Take 1 tablet by mouth daily.     mycophenolate (CELLCEPT) 500 MG tablet Take by mouth.     pantoprazole (PROTONIX) 40 MG tablet Take 40 mg by mouth daily.     acetaminophen (TYLENOL) 325 MG tablet Take 650 mg by mouth every 4 (four) hours as needed. (Patient not taking: Reported on 07/08/2023)     albuterol (VENTOLIN HFA) 108 (90 Base) MCG/ACT inhaler Inhale 2 puffs into the lungs every  6 (six) hours as needed for wheezing.     aspirin EC 81 MG tablet Take 1 tablet (81 mg total) by mouth daily. Swallow whole. (Patient not taking: Reported on 07/08/2023) 30 tablet 12   furosemide (LASIX) 20 MG tablet Take 20 mg by mouth. (Patient not taking: Reported on 07/08/2023)     melatonin 5 MG TABS Take 5 mg by mouth. (Patient not taking: Reported on 07/08/2023)     ondansetron (ZOFRAN) 4 MG tablet Take 4 mg by mouth every 8 (eight) hours as needed for nausea or vomiting. (Patient not taking: Reported on 07/08/2023)     potassium chloride (MICRO-K) 10 MEQ CR capsule Take 10 mEq by mouth daily. (Patient not taking: Reported on 07/08/2023)     silver sulfADIAZINE (SILVADENE) 1 % cream Apply 1 Application topically daily. Apply to left elbow area topically (Patient not taking: Reported on 07/08/2023)     No current facility-administered medications for this visit.    PHYSICAL EXAMINATION:  Vitals:   07/08/23 1126 07/08/23 1128  BP: 138/72   Pulse:  65  Resp: 18 18  SpO2:  96%   Filed Weights   07/08/23 1120  Weight: 103 lb (46.7 kg)   Positive for crackles bilaterally diffuse lung fields.  Physical Exam Vitals and nursing note reviewed.  HENT:     Head: Normocephalic and  atraumatic.     Mouth/Throat:     Pharynx: Oropharynx is clear.  Eyes:     Extraocular Movements: Extraocular movements intact.     Pupils: Pupils are equal, round, and reactive to light.  Cardiovascular:     Rate and Rhythm: Normal rate and regular rhythm.  Pulmonary:     Comments: Decreased breath sounds bilaterally.  Abdominal:     Palpations: Abdomen is soft.  Musculoskeletal:        General: Normal range of motion.     Cervical back: Normal range of motion.  Skin:    General: Skin is warm.  Neurological:     General: No focal deficit present.     Mental Status: She is alert and oriented to person, place, and time.  Psychiatric:        Behavior: Behavior normal.        Judgment: Judgment normal.     LABORATORY DATA:  I have reviewed the data as listed Lab Results  Component Value Date   WBC 8.4 06/08/2022   HGB 13.9 06/08/2022   HCT 42.9 06/08/2022   MCV 98.2 06/08/2022   PLT 217 06/08/2022   No results for input(s): "NA", "K", "CL", "CO2", "GLUCOSE", "BUN", "CREATININE", "CALCIUM", "GFRNONAA", "GFRAA", "PROT", "ALBUMIN", "AST", "ALT", "ALKPHOS", "BILITOT", "BILIDIR", "IBILI" in the last 8760 hours.  RADIOGRAPHIC STUDIES: I have personally reviewed the radiological images as listed and agreed with the findings in the report. CT CHEST W CONTRAST  Result Date: 07/03/2023 CLINICAL DATA:  Lung mass.  Pulmonary fibrosis. EXAM: CT CHEST WITH CONTRAST TECHNIQUE: Multidetector CT imaging of the chest was performed during intravenous contrast administration. RADIATION DOSE REDUCTION: This exam was performed according to the departmental dose-optimization program which includes automated exposure control, adjustment of the mA and/or kV according to patient size and/or use of iterative reconstruction technique. CONTRAST:  75mL OMNIPAQUE IOHEXOL 300 MG/ML  SOLN COMPARISON:  09/02/2022 FINDINGS: Cardiovascular: Again noted is enlargement of heart. Atherosclerotic calcifications in  thoracic aorta without aneurysm. Main pulmonary arteries are patent. Mediastinum/Nodes: Soft tissue enlargement in the left infrahilar region on image 80/2 measures 1.5 x 1.8  cm. Small mediastinal lymph nodes are similar to the previous examination. Esophagus is unremarkable. Lungs/Pleura: The left lung lesion has markedly enlarged in size. Left lung mass now measures 5.8 x 5.0 x 5.4 cm and previously measured up to 2.2 cm. New small left pleural effusion. Again noted are chronic changes throughout the lungs with a pulmonary fibrosis pattern featuring large areas of honeycombing at the lung bases, subpleural opacities and bronchiectasis particularly in the left lung. Question a new 4 mm nodule in the left lung apex on image 21/4. Upper Abdomen: No acute abnormality in the visualized upper abdomen. Musculoskeletal: Evidence for old fracture involving the proximal left humerus. Again noted is a compression deformity involving the T7 vertebral body with increased density or sclerosis throughout the vertebral body and difficult to exclude a pathologic fracture. There is a new mild compression deformity involving the T6 vertebral body compatible with a compression fracture. There is minimal bony retropulsion along the posterosuperior endplate at T6. Old compression fracture at L1 containing bone cement. IMPRESSION: 1. The lesion in the left lower lobe has markedly enlarged. The left lung mass measures up to 5.8 cm. In addition, there is evidence for left hilar lymphadenopathy. Findings are suggestive for a primary lung cancer. 2. New compression deformity involving the T6 vertebral body. Old compression deformity involving T7 with sclerosis. Difficult to exclude pathologic compression fractures. 3. Severe pulmonary fibrosis. These results will be called to the ordering clinician or representative by the Radiologist Assistant, and communication documented in the PACS or [employer]. Electronically Signed   By: Richarda Overlie M.D.   On: 07/03/2023 12:58     Mass of lower lobe of left lung # SEP 2024- The lesion in the left lower lobe has markedly enlarged. The left lung mass measures up to 5.8 cm [NOV 2024- CT-approximately 2 cm]. In addition, there is evidence for left hilar lymphadenopathy. Findings are suggestive for a primary lung cancer.  New compression deformity involving the T6 vertebral body. Old compression deformity involving T7 with sclerosis. Difficult to exclude pathologic compression fractures.  # As per pulmonary/patient poor candidate for any general anesthesia for biopsy.  Recommended PET scan for further evaluation.   # I had a long discussion with the patient and her friend regarding my significant concerns for malignancy based on imaging and the progression as noted on the scan.  Wait on PET scan for staging purposes.  Will also review the tumor conference.  # Discussed this patient a poor candidate for any surgical options.  Also poor candidate for any systemic chemotherapy/ ?  Immunotherapy option.  Discussed option of radiation-however radiation could still be most likely palliative given nodal disease; and also pleural fluid noted.  Will make a referral to radiation oncology.  # chronic ILD-hx of smoking [on home O2 lit]-Dr.A- overall stable.   # Hx of RA- on plaquenil [Dr.Patel]  Thank you Dr.Aleskerov for allowing me to participate in the care of your pleasant patient. Please do not hesitate to contact me with questions or concerns in the interim.  Daughter out of [place]-recommend call us and speak to [facility] if any questions about the plan.   # I reviewed the blood work- with the patient in detail; also reviewed the imaging independently [as summarized above]; and with the patient in detail.   # DISPOSITION: # no labs- # PET ASAP # referral to Dr.Chrystal  # follow up in 2 weeks-MD; no labs- Dr.B  # I reviewed the blood work- with  the patient in detail; also reviewed the imaging  independently [as summarized above]; and with the patient in detail.      Earna Coder, MD 07/08/2023 1:20 PM

## 2023-07-11 ENCOUNTER — Ambulatory Visit: Admission: RE | Admit: 2023-07-11 | Payer: Medicare Other | Source: Ambulatory Visit

## 2023-07-14 ENCOUNTER — Encounter
Admission: RE | Admit: 2023-07-14 | Discharge: 2023-07-14 | Disposition: A | Discharge status: Home / Self Care | Payer: Medicare Other | Source: Ambulatory Visit | Attending: Internal Medicine | Admitting: Internal Medicine

## 2023-07-14 DIAGNOSIS — R918 Other nonspecific abnormal finding of lung field: Secondary | ICD-10-CM | POA: Insufficient documentation

## 2023-07-14 LAB — GLUCOSE, CAPILLARY: Glucose-Capillary: 60 mg/dL — ABNORMAL LOW (ref 70–99)

## 2023-07-14 MED ORDER — FLUDEOXYGLUCOSE F - 18 (FDG) INJECTION
5.3000 | Freq: Once | INTRAVENOUS | Status: AC
  Administered 2023-07-14: 5.17 via INTRAVENOUS

## 2023-07-17 ENCOUNTER — Other Ambulatory Visit: Payer: Medicare Other

## 2023-07-17 ENCOUNTER — Telehealth: Payer: Self-pay | Admitting: Internal Medicine

## 2023-07-17 NOTE — Telephone Encounter (Signed)
discussion at tumor conference plan diagnostic thora; also order sputum cytology. Dsicussed with [place]; Dr.Alsekerov.  GB

## 2023-07-22 ENCOUNTER — Inpatient Hospital Stay: Payer: Medicare Other | Attending: Internal Medicine | Admitting: Internal Medicine

## 2023-07-22 ENCOUNTER — Telehealth: Payer: Self-pay | Admitting: *Deleted

## 2023-07-22 VITALS — BP 148/80 | HR 67 | Temp 97.8°F | Ht 63.0 in | Wt 103.6 lb

## 2023-07-22 DIAGNOSIS — Z08 Encounter for follow-up examination after completed treatment for malignant neoplasm: Secondary | ICD-10-CM | POA: Diagnosis not present

## 2023-07-22 DIAGNOSIS — R918 Other nonspecific abnormal finding of lung field: Secondary | ICD-10-CM | POA: Diagnosis not present

## 2023-07-22 DIAGNOSIS — J90 Pleural effusion, not elsewhere classified: Secondary | ICD-10-CM | POA: Diagnosis not present

## 2023-07-22 DIAGNOSIS — Z87891 Personal history of nicotine dependence: Secondary | ICD-10-CM | POA: Diagnosis not present

## 2023-07-22 DIAGNOSIS — J849 Interstitial pulmonary disease, unspecified: Secondary | ICD-10-CM | POA: Diagnosis not present

## 2023-07-22 DIAGNOSIS — Z85118 Personal history of other malignant neoplasm of bronchus and lung: Secondary | ICD-10-CM | POA: Diagnosis not present

## 2023-07-22 NOTE — Progress Notes (Signed)
PET 07/14/23.

## 2023-07-22 NOTE — Telephone Encounter (Signed)
Called patient to schedule her Thoracentesis. Procedure to be done on Thurs 07/24/23 arrival time 12:30 pm. She reports to [facility] in [facility]. Tell them she is there to have fluid removed from her Lung. She will have a driver. She can eat and drink prior to procedure.

## 2023-07-22 NOTE — Progress Notes (Signed)
[facility] CONSULT NOTE  Patient Care Team: Marisue Ivan, MD as PCP - General (Family Medicine) Earna Coder, MD as Consulting Physician (Oncology) Glory Buff, RN as Oncology Nurse Navigator  CHIEF COMPLAINTS/PURPOSE OF CONSULTATION: lung mass /lung cancer  Oncology History   No history exists.    HISTORY OF PRESENTING ILLNESS: Patient ambulating-in wheel chair.  Accompanied by daughter.   Morgan Sandoval 88 y.o.  female with a history of smoking; and Hx of Pulmonary fibrosis- on O2 , RA-left lower lobe lung mass is here to review the results of her PET scan.  Awaiting evaluation with pulmonary.  Also awaiting evaluation with Dr. Aggie Cosier radiation oncology on 10/17.   Patient notes to have worsening cough at nighttime.  Otherwise no worsening shortness of breath.  She continues to be on oxygen.  Unfortunately continues to have back pain.   Review of Systems  Constitutional:  Positive for malaise/fatigue and weight loss. Negative for chills, diaphoresis and fever.  HENT:  Negative for nosebleeds and sore throat.   Eyes:  Negative for double vision.  Respiratory:  Positive for cough and sputum production. Negative for hemoptysis, shortness of breath and wheezing.   Cardiovascular:  Negative for chest pain, palpitations, orthopnea and leg swelling.  Gastrointestinal:  Negative for abdominal pain, blood in stool, constipation, diarrhea, heartburn, melena, nausea and vomiting.  Genitourinary:  Negative for dysuria, frequency and urgency.  Musculoskeletal:  Positive for back pain and joint pain.  Skin: Negative.  Negative for itching and rash.  Neurological:  Negative for dizziness, tingling, focal weakness, weakness and headaches.  Endo/Heme/Allergies:  Does not bruise/bleed easily.  Psychiatric/Behavioral:  Negative for depression. The patient is not nervous/anxious and does not have insomnia.     MEDICAL HISTORY:  Past Medical History:  Diagnosis  Date   Actinic keratosis    Cholelithiasis    Chronic diarrhea    Colon polyp    Diverticulosis    GERD (gastroesophageal reflux disease)    if she eats acidic foods   Hypertension    Mucoid cyst of joint    left index finger   Osteoporosis     SURGICAL HISTORY: Past Surgical History:  Procedure Laterality Date   APPENDECTOMY     BIOPSY  05/19/2019   Procedure: BIOPSY;  Surgeon: Malissa Hippo, MD;  Location: [facility];  Service: Endoscopy;;  sigmoid colon   CHOLECYSTECTOMY     COLON SURGERY     Removed large polyps   COLONOSCOPY     COLONOSCOPY N/A 05/18/2015   Procedure: COLONOSCOPY;  Surgeon: Malissa Hippo, MD;  Location: [facility];  Service: Endoscopy;  Laterality: N/A;  1030   COLONOSCOPY N/A 05/19/2019   Procedure: COLONOSCOPY;  Surgeon: Malissa Hippo, MD;  Location: [facility];  Service: Endoscopy;  Laterality: N/A;  8:30   GANGLION CYST EXCISION Left 03/2019   the fore finger   IR VERTEBROPLASTY LUMBAR BX INC UNI/BIL INC/INJECT/IMAGING  07/27/2020   MASS EXCISION Left 03/02/2019   Procedure: EXCISION CYST, DEBRIDEMENT PROXIMAL INTERPHALANGEAL JOINT LEFT INDEX FINGER;  Surgeon: Cindee Salt, MD;  Location: [facility];  Service: Orthopedics;  Laterality: Left;   RECTAL SURGERY  2000s   [facility]   TONSILLECTOMY      SOCIAL HISTORY: Social History   Socioeconomic History   Marital status: Widowed    Spouse name: Not on file   Number of children: Not on file   Years of education: Not on  file   Highest education level: Not on file  Occupational History   Not on file  Tobacco Use   Smoking status: Former   Smokeless tobacco: Never  Vaping Use   Vaping status: Never Used  Substance and Sexual Activity   Alcohol use: Yes    Comment: occ   Drug use: No   Sexual activity: Not on file  Other Topics Concern   Not on file  Social History Narrative   Not on file   Social Determinants of Health   Financial Resource Strain: Low  Risk  (05/13/2023)   Received from [facility]   Overall Financial Resource Strain (CARDIA)    Difficulty of Paying Living Expenses: Not hard at all  Food Insecurity: No Food Insecurity (07/08/2023)   Hunger Vital Sign    Worried About Running Out of Food in the Last Year: Never true    Ran Out of Food in the Last Year: Never true  Transportation Needs: Unknown (07/08/2023)   PRAPARE - Administrator, Civil Service (Medical): No    Lack of Transportation (Non-Medical): Not on file  Physical Activity: Not on file  Stress: Not on file  Social Connections: Not on file  Intimate Partner Violence: Not At Risk (07/08/2023)   Humiliation, Afraid, Rape, and Kick questionnaire    Fear of Current or Ex-Partner: No    Emotionally Abused: No    Physically Abused: No    Sexually Abused: No    FAMILY HISTORY: Family History  Problem Relation Age of Onset   Cancer - Colon Brother     ALLERGIES:  is allergic to hydrochlorothiazide, nifedipine, and sulfa antibiotics.  MEDICATIONS:  Current Outpatient Medications  Medication Sig Dispense Refill   albuterol (VENTOLIN HFA) 108 (90 Base) MCG/ACT inhaler Inhale into the lungs.     alendronate (FOSAMAX) 70 MG tablet Take 70 mg by mouth every Sunday.     azithromycin (ZITHROMAX) 250 MG tablet Take 250 mg by mouth 3 (three) times a week.     carvedilol (COREG) 3.125 MG tablet Take 3.125 mg by mouth 2 (two) times daily.     cholecalciferol (VITAMIN D3) 25 MCG (1000 UNIT) tablet Take 1,000 Units by mouth daily.     Cyanocobalamin (B-12) 5000 MCG CAPS Take 5,000 mcg by mouth daily.     diclofenac sodium (VOLTAREN) 1 % GEL Apply 2 g topically 4 (four) times daily.     hydroxychloroquine (PLAQUENIL) 200 MG tablet Take 200 mg by mouth daily.     ipratropium (ATROVENT) 0.06 % nasal spray Place into the nose.     lisinopril (ZESTRIL) 20 MG tablet Take 1 tablet (20 mg total) by mouth daily.     melatonin 5 MG TABS Take 5 mg by  mouth.     Multiple Vitamins-Minerals (PRESERVISION AREDS 2+MULTI VIT) CAPS Take 1 tablet by mouth daily.     mycophenolate (CELLCEPT) 500 MG tablet Take by mouth.     pantoprazole (PROTONIX) 40 MG tablet Take 40 mg by mouth daily.     acetaminophen (TYLENOL) 325 MG tablet Take 650 mg by mouth every 4 (four) hours as needed. (Patient not taking: Reported on 07/08/2023)     albuterol (VENTOLIN HFA) 108 (90 Base) MCG/ACT inhaler Inhale 2 puffs into the lungs every 6 (six) hours as needed for wheezing.     aspirin EC 81 MG tablet Take 1 tablet (81 mg total) by mouth daily. Swallow whole. (Patient not taking: Reported on 07/08/2023)  30 tablet 12   furosemide (LASIX) 20 MG tablet Take 20 mg by mouth. (Patient not taking: Reported on 07/08/2023)     ondansetron (ZOFRAN) 4 MG tablet Take 4 mg by mouth every 8 (eight) hours as needed for nausea or vomiting. (Patient not taking: Reported on 07/08/2023)     potassium chloride (MICRO-K) 10 MEQ CR capsule Take 10 mEq by mouth daily. (Patient not taking: Reported on 07/08/2023)     silver sulfADIAZINE (SILVADENE) 1 % cream Apply 1 Application topically daily. Apply to left elbow area topically (Patient not taking: Reported on 07/08/2023)     No current facility-administered medications for this visit.    PHYSICAL EXAMINATION:  Vitals:   07/22/23 1253  BP: (!) 148/80  Pulse: 67  Temp: 97.8 F (36.6 C)  SpO2: 100%    Filed Weights   07/22/23 1253  Weight: 103 lb 9.6 oz (47 kg)    Positive for crackles bilaterally diffuse lung fields.  Physical Exam Vitals and nursing note reviewed.  HENT:     Head: Normocephalic and atraumatic.     Mouth/Throat:     Pharynx: Oropharynx is clear.  Eyes:     Extraocular Movements: Extraocular movements intact.     Pupils: Pupils are equal, round, and reactive to light.  Cardiovascular:     Rate and Rhythm: Normal rate and regular rhythm.  Pulmonary:     Comments: Decreased breath sounds bilaterally.   Abdominal:     Palpations: Abdomen is soft.  Musculoskeletal:        General: Normal range of motion.     Cervical back: Normal range of motion.  Skin:    General: Skin is warm.  Neurological:     General: No focal deficit present.     Mental Status: She is alert and oriented to person, place, and time.  Psychiatric:        Behavior: Behavior normal.        Judgment: Judgment normal.     LABORATORY DATA:  I have reviewed the data as listed Lab Results  Component Value Date   WBC 8.4 06/08/2022   HGB 13.9 06/08/2022   HCT 42.9 06/08/2022   MCV 98.2 06/08/2022   PLT 217 06/08/2022   No results for input(s): "NA", "K", "CL", "CO2", "GLUCOSE", "BUN", "CREATININE", "CALCIUM", "GFRNONAA", "GFRAA", "PROT", "ALBUMIN", "AST", "ALT", "ALKPHOS", "BILITOT", "BILIDIR", "IBILI" in the last 8760 hours.  RADIOGRAPHIC STUDIES: I have personally reviewed the radiological images as listed and agreed with the findings in the report. NM PET Image Initial (PI) Skull Base To Thigh  Result Date: 07/15/2023 CLINICAL DATA:  Initial treatment strategy for lung nodule. EXAM: NUCLEAR MEDICINE PET SKULL BASE TO THIGH TECHNIQUE: 5.2 mCi F-18 FDG was injected intravenously. Full-ring PET imaging was performed from the skull base to thigh after the radiotracer. CT data was obtained and used for attenuation correction and anatomic localization. Fasting blood glucose: 60 mg/dl COMPARISON:  Chest CT 62/13/0865 FINDINGS: Mediastinal blood pool activity: SUV max 1.7 Liver activity: SUV max NA NECK: No hypermetabolic lymph nodes in the neck. Incidental CT findings: None. CHEST: Hypermetabolic mass in the LEFT lower lobe measures 6.7 x 5.3 cm has intense peripheral metabolic activity with SUV max equal 23.1. Second nodule adjacent to the dominant mass in the LEFT lower lobe measures 17 mm on 57 the equal intensity Small hyper hypermetabolic nodule in the LEFT upper lobe measures 8 mm with SUV max equal 7.2 No  hypermetabolic mediastinal lymph nodes. Small to  moderate size layering LEFT pleural effusion. Underlying interstitial thickening within the LEFT and RIGHT lung. Incidental CT findings: None. ABDOMEN/PELVIS: No abnormal hypermetabolic activity within the liver, pancreas, adrenal glands, or spleen. No hypermetabolic lymph nodes in the abdomen or pelvis. Incidental CT findings: None. SKELETON: No aggressive osseous lesion severe compression deformity of the L1 vertebral body is augmentation cement within the compressed vertebral body. Compression fracture of the midthoracic spine additionally. Incidental CT findings: None. IMPRESSION: 1. Hypermetabolic mass in the LEFT lower lobe is most consistent with primary bronchogenic carcinoma. 2. Second hypermetabolic nodule in the LEFT lower lobe consistent with metastatic disease. 3. Small hypermetabolic nodule in the LEFT upper lobe is consistent with metastatic disease. 4. No evidence of metastatic adenopathy in the chest. 5. No evidence distant metastatic disease. 6. Small to moderate size LEFT pleural effusion. 7. Underlying interstitial thickening within the LEFT and RIGHT lung. 8. Severe compression fractures in the thoracic spine and lumbar spine. No evidence of metastasis. Electronically Signed   By: Genevive Bi M.D.   On: 07/15/2023 14:39     Mass of lower lobe of left lung # SEP 2024- The lesion in the left lower lobe has markedly enlarged. The left lung mass measures up to 5.8 cm [NOV 2024- CT-approximately 2 cm]. In addition, there is evidence for left hilar lymphadenopathy. Findings are suggestive for a primary lung cancer.  New compression deformity involving the T6 vertebral body. Old compression deformity involving T7 with sclerosis. Difficult to exclude pathologic compression fractures. PET scan; 07/15/2023-  Hypermetabolic mass in the LEFT lower lobe is most consistent with primary bronchogenic carcinoma. Second hypermetabolic nodule in the LEFT  lower lobe consistent with metastatic disease.  Small hypermetabolic nodule in the LEFT upper lobe is consistent with metastatic disease. No evidence of metastatic adenopathy in the chest. No evidence distant metastatic disease. Small to moderate size LEFT pleural effusion. Underlying interstitial thickening within the LEFT and RIGHT lung.  # Patient considered poor candidate for any general anesthesia for biopsy.  Discussed regarding thoracentesis; and also induced sputum cytology.  Also awaiting evaluation with Dr. Aggie Cosier radiation oncology on 10/17. Discussed with Dr.Aleskerov; and Hayley-reviewed at the tumor conference.  Thoracentesis ordered.  # I discussed with the patient and daughter that given the large size of the primary tumor also given satellite lesions-unlikely her cancer would be cured: Radiation therapy can lead to good tumor control.  Defer to Dr. Aggie Cosier regarding tolerance of radiation/given her borderline lung function.  If patient chooses not to proceed with any therapy, hospice would be an option.  However patient is leaning towards treatment this time.  # chronic ILD-hx of smoking [on home O2 lit]-Dr.A- overall stable.   # Hx of RA- on plaquenil [Dr.Patel]  #Incidental findings on Imaging  PET-CT , 2024: 8. Severe compression fractures in the thoracic spine and lumbar spine. No evidence of metastasis.I reviewed/discussed/counseled the patient.   # I reviewed the blood work- with the patient in detail; also reviewed the imaging independently [as summarized above]; and with the patient in detail.   # DISPOSITION: # thoracentesis ASAP # follow up  in 6 weeks-; MD; no labs- Dr.B  # I reviewed the blood work- with the patient in detail; also reviewed the imaging independently [as summarized above]; and with the patient in detail.      Earna Coder, MD 07/22/2023 2:24 PM

## 2023-07-22 NOTE — Assessment & Plan Note (Addendum)
#   SEP 2024- The lesion in the left lower lobe has markedly enlarged. The left lung mass measures up to 5.8 cm [NOV 2024- CT-approximately 2 cm]. In addition, there is evidence for left hilar lymphadenopathy. Findings are suggestive for a primary lung cancer.  New compression deformity involving the T6 vertebral body. Old compression deformity involving T7 with sclerosis. Difficult to exclude pathologic compression fractures. PET scan; 07/15/2023-  Hypermetabolic mass in the LEFT lower lobe is most consistent with primary bronchogenic carcinoma. Second hypermetabolic nodule in the LEFT lower lobe consistent with metastatic disease.  Small hypermetabolic nodule in the LEFT upper lobe is consistent with metastatic disease. No evidence of metastatic adenopathy in the chest. No evidence distant metastatic disease. Small to moderate size LEFT pleural effusion. Underlying interstitial thickening within the LEFT and RIGHT lung.  # Patient considered poor candidate for any general anesthesia for biopsy.  Discussed regarding thoracentesis; and also induced sputum cytology.  Also awaiting evaluation with Dr. Aggie Cosier radiation oncology on 10/17. Discussed with Dr.Aleskerov; and Hayley-reviewed at the tumor conference.  Thoracentesis ordered.  # I discussed with the patient and daughter that given the large size of the primary tumor also given satellite lesions-unlikely her cancer would be cured: Radiation therapy can lead to good tumor control.  Defer to Dr. Aggie Cosier regarding tolerance of radiation/given her borderline lung function.  If patient chooses not to proceed with any therapy, hospice would be an option.  However patient is leaning towards treatment this time.  # chronic ILD-hx of smoking [on home O2 lit]-Dr.A- overall stable.   # Hx of RA- on plaquenil [Dr.Patel]  #Incidental findings on Imaging  PET-CT , 2024: 8. Severe compression fractures in the thoracic spine and lumbar spine. No evidence of  metastasis.I reviewed/discussed/counseled the patient.   # I reviewed the blood work- with the patient in detail; also reviewed the imaging independently [as summarized above]; and with the patient in detail.   # DISPOSITION: # thoracentesis ASAP # follow up  in 6 weeks-; MD; no labs- Dr.B  # I reviewed the blood work- with the patient in detail; also reviewed the imaging independently [as summarized above]; and with the patient in detail.

## 2023-07-24 ENCOUNTER — Ambulatory Visit
Admission: RE | Admit: 2023-07-24 | Discharge: 2023-07-24 | Disposition: A | Discharge status: Home / Self Care | Payer: Medicare Other | Source: Ambulatory Visit | Attending: Internal Medicine | Admitting: Internal Medicine

## 2023-07-24 ENCOUNTER — Other Ambulatory Visit: Payer: Self-pay | Admitting: Student

## 2023-07-24 ENCOUNTER — Ambulatory Visit
Admission: RE | Admit: 2023-07-24 | Discharge: 2023-07-24 | Disposition: A | Discharge status: Home / Self Care | Payer: Medicare Other | Source: Ambulatory Visit | Attending: Student | Admitting: Student

## 2023-07-24 DIAGNOSIS — J8410 Pulmonary fibrosis, unspecified: Secondary | ICD-10-CM | POA: Diagnosis not present

## 2023-07-24 DIAGNOSIS — R918 Other nonspecific abnormal finding of lung field: Secondary | ICD-10-CM | POA: Insufficient documentation

## 2023-07-24 DIAGNOSIS — Z9889 Other specified postprocedural states: Secondary | ICD-10-CM

## 2023-07-24 DIAGNOSIS — J90 Pleural effusion, not elsewhere classified: Secondary | ICD-10-CM | POA: Diagnosis not present

## 2023-07-24 DIAGNOSIS — C3490 Malignant neoplasm of unspecified part of unspecified bronchus or lung: Secondary | ICD-10-CM | POA: Diagnosis not present

## 2023-07-24 DIAGNOSIS — I517 Cardiomegaly: Secondary | ICD-10-CM | POA: Diagnosis not present

## 2023-07-24 LAB — BODY FLUID CELL COUNT WITH DIFFERENTIAL
Eos, Fluid: 0 %
Lymphs, Fluid: 15 %
Monocyte-Macrophage-Serous Fluid: 3 %
Neutrophil Count, Fluid: 82 %
Total Nucleated Cell Count, Fluid: 1492 uL

## 2023-07-24 LAB — PROTEIN, PLEURAL OR PERITONEAL FLUID: Total protein, fluid: 3.9 g/dL

## 2023-07-24 LAB — LACTATE DEHYDROGENASE, PLEURAL OR PERITONEAL FLUID: LD, Fluid: 184 U/L — ABNORMAL HIGH (ref 3–23)

## 2023-07-24 LAB — GLUCOSE, PLEURAL OR PERITONEAL FLUID: Glucose, Fluid: 150 mg/dL

## 2023-07-24 MED ORDER — LIDOCAINE HCL (PF) 1 % IJ SOLN
10.0000 mL | Freq: Once | INTRAMUSCULAR | Status: AC
  Administered 2023-07-24: 10 mL via SUBCUTANEOUS
  Filled 2023-07-24: qty 10

## 2023-07-24 NOTE — Procedures (Signed)
PROCEDURE SUMMARY:  Successful US guided left thoracentesis. Yielded 250 ml of blood-tinged fluid. Pt tolerated procedure well. No immediate complications.  Specimen sent for labs. CXR ordered; no post-procedure pneumothorax identified.   EBL < 2 mL  Mickie Kay, NP 07/24/2023 2:33 PM

## 2023-07-25 LAB — CYTOLOGY - NON PAP

## 2023-07-26 LAB — ACID FAST SMEAR (AFB, MYCOBACTERIA): Acid Fast Smear: NEGATIVE

## 2023-07-28 LAB — MISC LABCORP TEST (SEND OUT)

## 2023-07-28 LAB — BODY FLUID CULTURE W GRAM STAIN: Culture: NO GROWTH

## 2023-07-31 ENCOUNTER — Inpatient Hospital Stay: Payer: Medicare Other

## 2023-07-31 ENCOUNTER — Encounter: Payer: Self-pay | Admitting: Radiation Oncology

## 2023-07-31 ENCOUNTER — Encounter: Payer: Self-pay | Admitting: *Deleted

## 2023-07-31 ENCOUNTER — Ambulatory Visit
Admission: RE | Admit: 2023-07-31 | Discharge: 2023-07-31 | Disposition: A | Discharge status: Home / Self Care | Payer: Medicare Other | Source: Ambulatory Visit | Attending: Radiation Oncology | Admitting: Radiation Oncology

## 2023-07-31 VITALS — BP 151/74 | HR 62 | Temp 97.6°F | Resp 16

## 2023-07-31 DIAGNOSIS — J8410 Pulmonary fibrosis, unspecified: Secondary | ICD-10-CM | POA: Diagnosis not present

## 2023-07-31 DIAGNOSIS — R918 Other nonspecific abnormal finding of lung field: Secondary | ICD-10-CM | POA: Diagnosis not present

## 2023-07-31 DIAGNOSIS — Z87891 Personal history of nicotine dependence: Secondary | ICD-10-CM | POA: Diagnosis not present

## 2023-07-31 DIAGNOSIS — R197 Diarrhea, unspecified: Secondary | ICD-10-CM | POA: Diagnosis not present

## 2023-07-31 DIAGNOSIS — Z860101 Personal history of adenomatous and serrated colon polyps: Secondary | ICD-10-CM | POA: Insufficient documentation

## 2023-07-31 DIAGNOSIS — I10 Essential (primary) hypertension: Secondary | ICD-10-CM | POA: Insufficient documentation

## 2023-07-31 DIAGNOSIS — Z7982 Long term (current) use of aspirin: Secondary | ICD-10-CM | POA: Insufficient documentation

## 2023-07-31 DIAGNOSIS — C3432 Malignant neoplasm of lower lobe, left bronchus or lung: Secondary | ICD-10-CM | POA: Diagnosis not present

## 2023-07-31 DIAGNOSIS — Z800 Family history of malignant neoplasm of digestive organs: Secondary | ICD-10-CM | POA: Insufficient documentation

## 2023-07-31 DIAGNOSIS — L570 Actinic keratosis: Secondary | ICD-10-CM | POA: Insufficient documentation

## 2023-07-31 DIAGNOSIS — M810 Age-related osteoporosis without current pathological fracture: Secondary | ICD-10-CM | POA: Diagnosis not present

## 2023-07-31 DIAGNOSIS — K219 Gastro-esophageal reflux disease without esophagitis: Secondary | ICD-10-CM | POA: Diagnosis not present

## 2023-07-31 DIAGNOSIS — Z79899 Other long term (current) drug therapy: Secondary | ICD-10-CM | POA: Diagnosis not present

## 2023-07-31 DIAGNOSIS — Z23 Encounter for immunization: Secondary | ICD-10-CM | POA: Insufficient documentation

## 2023-07-31 DIAGNOSIS — Z791 Long term (current) use of non-steroidal anti-inflammatories (NSAID): Secondary | ICD-10-CM | POA: Diagnosis not present

## 2023-07-31 MED ORDER — INFLUENZA VAC A&B SURF ANT ADJ 0.5 ML IM SUSY
0.5000 mL | PREFILLED_SYRINGE | Freq: Once | INTRAMUSCULAR | Status: AC
  Administered 2023-07-31: 0.5 mL via INTRAMUSCULAR
  Filled 2023-07-31: qty 0.5

## 2023-07-31 NOTE — Consult Note (Signed)
NEW PATIENT EVALUATION  Name: Morgan Sandoval  MRN: [id number]  Date:   07/31/2023     DOB: May 19, 1935   This 88 y.o. female patient presents to the clinic for initial evaluation of locally advanced non-small cell lung cancer of the left lung and patient with multiple medical comorbidities precluding biopsy.  REFERRING PHYSICIAN: Marisue Ivan, MD  CHIEF COMPLAINT:  Chief Complaint  Patient presents with   Lung Mass    DIAGNOSIS: The encounter diagnosis was Mass of lower lobe of left lung.   PREVIOUS INVESTIGATIONS:  PET scan CT scans reviewed Clinical notes reviewed Labs reviewed  HPI: Patient is a 88 year old female oxygen dependent with history of pulmonary fibrosis.  She had an MRI for worsening back pain at which time a mass in the left lung was noted.  MRI of her back showed subacute T6 compression fracture with remote T7 and L1 compression fractures.  There was a large left lower lobe mass presumed malignant.  Chest CT confirmed a 5.8 cm mass in the left lower lobe with evidence of left hilar adenopathy suggestive for primary bronchogenic carcinoma.  PET/CT scan demonstrated hypermetabolic mass in the left lower lobe consistent with a primary bronchogenic carcinoma there is a second nodule in the left lower lobe as well as a small nodule hypermetabolic in the left upper lobe consistent with metastatic disease no other evidence of metastatic disease chest abdomen pelvis was noted she did have a small to moderate left pleural effusion which recently had thoracentesis.  Although no malignant cells were seen.  She is based on her but medical comorbidities to risky to biopsy she is seen today for consideration of palliative radiation.  She is not having no significant hemoptysis cough at this time she is oxygen dependent.  PLANNED TREATMENT REGIMEN: Palliative radiation therapy  PAST MEDICAL HISTORY:  has a past medical history of Actinic keratosis, Cholelithiasis, Chronic  diarrhea, Colon polyp, Diverticulosis, GERD (gastroesophageal reflux disease), Hypertension, Mucoid cyst of joint, and Osteoporosis.    PAST SURGICAL HISTORY:  Past Surgical History:  Procedure Laterality Date   APPENDECTOMY     BIOPSY  05/19/2019   Procedure: BIOPSY;  Surgeon: Malissa Hippo, MD;  Location: [facility];  Service: Endoscopy;;  sigmoid colon   CHOLECYSTECTOMY     COLON SURGERY     Removed large polyps   COLONOSCOPY     COLONOSCOPY N/A 05/18/2015   Procedure: COLONOSCOPY;  Surgeon: Malissa Hippo, MD;  Location: [facility];  Service: Endoscopy;  Laterality: N/A;  1030   COLONOSCOPY N/A 05/19/2019   Procedure: COLONOSCOPY;  Surgeon: Malissa Hippo, MD;  Location: [facility];  Service: Endoscopy;  Laterality: N/A;  8:30   GANGLION CYST EXCISION Left 03/2019   the fore finger   IR VERTEBROPLASTY LUMBAR BX INC UNI/BIL INC/INJECT/IMAGING  07/27/2020   MASS EXCISION Left 03/02/2019   Procedure: EXCISION CYST, DEBRIDEMENT PROXIMAL INTERPHALANGEAL JOINT LEFT INDEX FINGER;  Surgeon: Cindee Salt, MD;  Location: [facility];  Service: Orthopedics;  Laterality: Left;   RECTAL SURGERY  2000s   [facility]   TONSILLECTOMY      FAMILY HISTORY: family history includes Cancer - Colon in her brother.  SOCIAL HISTORY:  reports that she has quit smoking. She has never used smokeless tobacco. She reports current alcohol use. She reports that she does not use drugs.  ALLERGIES: Hydrochlorothiazide, Nifedipine, and Sulfa antibiotics  MEDICATIONS:  Current Outpatient Medications  Medication Sig Dispense Refill  albuterol (VENTOLIN HFA) 108 (90 Base) MCG/ACT inhaler Inhale 2 puffs into the lungs every 6 (six) hours as needed for wheezing.     albuterol (VENTOLIN HFA) 108 (90 Base) MCG/ACT inhaler Inhale into the lungs.     alendronate (FOSAMAX) 70 MG tablet Take 70 mg by mouth every Sunday.     azithromycin (ZITHROMAX) 250 MG tablet Take 250 mg by mouth 3 (three)  times a week.     carvedilol (COREG) 3.125 MG tablet Take 3.125 mg by mouth 2 (two) times daily.     cholecalciferol (VITAMIN D3) 25 MCG (1000 UNIT) tablet Take 1,000 Units by mouth daily.     Cyanocobalamin (B-12) 5000 MCG CAPS Take 5,000 mcg by mouth daily.     diclofenac sodium (VOLTAREN) 1 % GEL Apply 2 g topically 4 (four) times daily.     hydroxychloroquine (PLAQUENIL) 200 MG tablet Take 200 mg by mouth daily.     ipratropium (ATROVENT) 0.06 % nasal spray Place into the nose.     lisinopril (ZESTRIL) 20 MG tablet Take 1 tablet (20 mg total) by mouth daily.     melatonin 5 MG TABS Take 5 mg by mouth.     Multiple Vitamins-Minerals (PRESERVISION AREDS 2+MULTI VIT) CAPS Take 1 tablet by mouth daily.     pantoprazole (PROTONIX) 40 MG tablet Take 40 mg by mouth daily.     acetaminophen (TYLENOL) 325 MG tablet Take 650 mg by mouth every 4 (four) hours as needed. (Patient not taking: Reported on 07/08/2023)     aspirin EC 81 MG tablet Take 1 tablet (81 mg total) by mouth daily. Swallow whole. (Patient not taking: Reported on 07/08/2023) 30 tablet 12   furosemide (LASIX) 20 MG tablet Take 20 mg by mouth. (Patient not taking: Reported on 07/08/2023)     mycophenolate (CELLCEPT) 500 MG tablet Take by mouth.     ondansetron (ZOFRAN) 4 MG tablet Take 4 mg by mouth every 8 (eight) hours as needed for nausea or vomiting. (Patient not taking: Reported on 07/08/2023)     potassium chloride (MICRO-K) 10 MEQ CR capsule Take 10 mEq by mouth daily. (Patient not taking: Reported on 07/08/2023)     silver sulfADIAZINE (SILVADENE) 1 % cream Apply 1 Application topically daily. Apply to left elbow area topically (Patient not taking: Reported on 07/08/2023)     No current facility-administered medications for this encounter.    ECOG PERFORMANCE STATUS:  0 - Asymptomatic  REVIEW OF SYSTEMS: Patient denies any weight loss, fatigue, weakness, fever, chills or night sweats. Patient denies any loss of vision, blurred  vision. Patient denies any ringing  of the ears or hearing loss. No irregular heartbeat. Patient denies heart murmur or history of fainting. Patient denies any chest pain or pain radiating to her upper extremities. Patient denies any shortness of breath, difficulty breathing at night, cough or hemoptysis. Patient denies any swelling in the lower legs. Patient denies any nausea vomiting, vomiting of blood, or coffee ground material in the vomitus. Patient denies any stomach pain. Patient states has had normal bowel movements no significant constipation or diarrhea. Patient denies any dysuria, hematuria or significant nocturia. Patient denies any problems walking, swelling in the joints or loss of balance. Patient denies any skin changes, loss of hair or loss of weight. Patient denies any excessive worrying or anxiety or significant depression. Patient denies any problems with insomnia. Patient denies excessive thirst, polyuria, polydipsia. Patient denies any swollen glands, patient denies easy bruising or easy bleeding.  Patient denies any recent infections, allergies or URI. Patient "s visual fields have not changed significantly in recent time.   PHYSICAL EXAM: BP (!) 151/74 Comment: patient see PCP for BP management  Pulse 62   Temp 97.6 F (36.4 C)   Resp 16  Frail-appearing wheelchair-bound female on nasal oxygen.  Well-developed well-nourished patient in NAD. HEENT reveals PERLA, EOMI, discs not visualized.  Oral cavity is clear. No oral mucosal lesions are identified. Neck is clear without evidence of cervical or supraclavicular adenopathy. Lungs are clear to A&P. Cardiac examination is essentially unremarkable with regular rate and rhythm without murmur rub or thrill. Abdomen is benign with no organomegaly or masses noted. Motor sensory and DTR levels are equal and symmetric in the upper and lower extremities. Cranial nerves II through XII are grossly intact. Proprioception is intact. No peripheral  adenopathy or edema is identified. No motor or sensory levels are noted. Crude visual fields are within normal range.  LABORATORY DATA: Cytology and labs reviewed    RADIOLOGY RESULTS: CT scans MRI scans and PET scan all reviewed compatible with above-stated findings   IMPRESSION: Local advanced non-small cell lung cancer of the left lung and nonbiopsied in 88 year old female  PLAN: At this time I have offered a course of palliative radiation therapy to her left lung incorporating the left hilar adenopathy also.  Would plan on delivering 40 Gray in 20 fractions.  This will be done to prevent further atelectasis of the lung hemoptysis and chest pain from the local advanced nature of the disease.  Risks and benefits of treatment occluding low side effect profile possibly in cough fatigue skin reaction all were described in detail to the patient and her daughter.  They will discuss with her family members although I tentatively set up of simulation for next week to begin treatment.  Patient and daughter both comprehend my recommendations well.    I would like to take this opportunity to thank you for allowing me to participate in the care of your patient.Carmina Miller, MD

## 2023-07-31 NOTE — Progress Notes (Signed)
Met with patient and her daughter during initial consult with Dr. Rushie Chestnut. All questions answered during visit. Reviewed upcoming appts. Instructed to call with any questions or needs. Pt verbalized understanding.

## 2023-08-06 ENCOUNTER — Other Ambulatory Visit: Payer: Self-pay

## 2023-08-06 ENCOUNTER — Other Ambulatory Visit: Payer: Self-pay | Admitting: Radiation Oncology

## 2023-08-06 ENCOUNTER — Inpatient Hospital Stay: Payer: Medicare Other

## 2023-08-06 ENCOUNTER — Encounter: Admission: EM | Disposition: E | Payer: Self-pay | Source: Home / Self Care | Attending: Pulmonary Disease

## 2023-08-06 ENCOUNTER — Emergency Department: Payer: Medicare Other

## 2023-08-06 ENCOUNTER — Encounter: Payer: Self-pay | Admitting: Emergency Medicine

## 2023-08-06 ENCOUNTER — Emergency Department: Admit: 2023-08-06 | Payer: Medicare Other | Admitting: Cardiology

## 2023-08-06 ENCOUNTER — Inpatient Hospital Stay
Admission: EM | Admit: 2023-08-06 | Discharge: 2023-08-15 | DRG: 871 | Disposition: E | Payer: Medicare Other | Attending: Pulmonary Disease | Admitting: Pulmonary Disease

## 2023-08-06 DIAGNOSIS — G931 Anoxic brain damage, not elsewhere classified: Secondary | ICD-10-CM | POA: Diagnosis not present

## 2023-08-06 DIAGNOSIS — Y92008 Other place in unspecified non-institutional (private) residence as the place of occurrence of the external cause: Secondary | ICD-10-CM | POA: Diagnosis not present

## 2023-08-06 DIAGNOSIS — Z7982 Long term (current) use of aspirin: Secondary | ICD-10-CM

## 2023-08-06 DIAGNOSIS — Z860100 Personal history of colon polyps, unspecified: Secondary | ICD-10-CM

## 2023-08-06 DIAGNOSIS — A419 Sepsis, unspecified organism: Secondary | ICD-10-CM | POA: Diagnosis not present

## 2023-08-06 DIAGNOSIS — J849 Interstitial pulmonary disease, unspecified: Secondary | ICD-10-CM | POA: Diagnosis not present

## 2023-08-06 DIAGNOSIS — J189 Pneumonia, unspecified organism: Secondary | ICD-10-CM | POA: Diagnosis present

## 2023-08-06 DIAGNOSIS — Z681 Body mass index (BMI) 19 or less, adult: Secondary | ICD-10-CM | POA: Diagnosis not present

## 2023-08-06 DIAGNOSIS — I469 Cardiac arrest, cause unspecified: Secondary | ICD-10-CM | POA: Diagnosis not present

## 2023-08-06 DIAGNOSIS — R739 Hyperglycemia, unspecified: Secondary | ICD-10-CM | POA: Diagnosis present

## 2023-08-06 DIAGNOSIS — J90 Pleural effusion, not elsewhere classified: Secondary | ICD-10-CM | POA: Diagnosis present

## 2023-08-06 DIAGNOSIS — M069 Rheumatoid arthritis, unspecified: Secondary | ICD-10-CM | POA: Diagnosis not present

## 2023-08-06 DIAGNOSIS — J9601 Acute respiratory failure with hypoxia: Secondary | ICD-10-CM | POA: Diagnosis not present

## 2023-08-06 DIAGNOSIS — R4020 Unspecified coma: Secondary | ICD-10-CM | POA: Diagnosis present

## 2023-08-06 DIAGNOSIS — S2231XA Fracture of one rib, right side, initial encounter for closed fracture: Secondary | ICD-10-CM | POA: Diagnosis present

## 2023-08-06 DIAGNOSIS — I213 ST elevation (STEMI) myocardial infarction of unspecified site: Secondary | ICD-10-CM | POA: Diagnosis not present

## 2023-08-06 DIAGNOSIS — Z9049 Acquired absence of other specified parts of digestive tract: Secondary | ICD-10-CM | POA: Diagnosis not present

## 2023-08-06 DIAGNOSIS — Z7983 Long term (current) use of bisphosphonates: Secondary | ICD-10-CM

## 2023-08-06 DIAGNOSIS — R918 Other nonspecific abnormal finding of lung field: Secondary | ICD-10-CM

## 2023-08-06 DIAGNOSIS — G9389 Other specified disorders of brain: Secondary | ICD-10-CM | POA: Diagnosis not present

## 2023-08-06 DIAGNOSIS — E43 Unspecified severe protein-calorie malnutrition: Secondary | ICD-10-CM | POA: Diagnosis not present

## 2023-08-06 DIAGNOSIS — Z882 Allergy status to sulfonamides status: Secondary | ICD-10-CM

## 2023-08-06 DIAGNOSIS — W19XXXA Unspecified fall, initial encounter: Secondary | ICD-10-CM | POA: Diagnosis present

## 2023-08-06 DIAGNOSIS — R911 Solitary pulmonary nodule: Secondary | ICD-10-CM | POA: Diagnosis not present

## 2023-08-06 DIAGNOSIS — G253 Myoclonus: Secondary | ICD-10-CM | POA: Diagnosis not present

## 2023-08-06 DIAGNOSIS — Z800 Family history of malignant neoplasm of digestive organs: Secondary | ICD-10-CM

## 2023-08-06 DIAGNOSIS — R402431 Glasgow coma scale score 3-8, in the field [EMT or ambulance]: Secondary | ICD-10-CM | POA: Diagnosis not present

## 2023-08-06 DIAGNOSIS — J440 Chronic obstructive pulmonary disease with acute lower respiratory infection: Secondary | ICD-10-CM | POA: Diagnosis present

## 2023-08-06 DIAGNOSIS — M810 Age-related osteoporosis without current pathological fracture: Secondary | ICD-10-CM | POA: Diagnosis present

## 2023-08-06 DIAGNOSIS — R404 Transient alteration of awareness: Secondary | ICD-10-CM | POA: Diagnosis not present

## 2023-08-06 DIAGNOSIS — I10 Essential (primary) hypertension: Secondary | ICD-10-CM | POA: Diagnosis present

## 2023-08-06 DIAGNOSIS — Z66 Do not resuscitate: Secondary | ICD-10-CM | POA: Diagnosis present

## 2023-08-06 DIAGNOSIS — C3490 Malignant neoplasm of unspecified part of unspecified bronchus or lung: Secondary | ICD-10-CM | POA: Diagnosis present

## 2023-08-06 DIAGNOSIS — I468 Cardiac arrest due to other underlying condition: Secondary | ICD-10-CM | POA: Diagnosis present

## 2023-08-06 DIAGNOSIS — M8448XA Pathological fracture, other site, initial encounter for fracture: Secondary | ICD-10-CM | POA: Diagnosis present

## 2023-08-06 DIAGNOSIS — I672 Cerebral atherosclerosis: Secondary | ICD-10-CM | POA: Diagnosis not present

## 2023-08-06 DIAGNOSIS — Z1152 Encounter for screening for COVID-19: Secondary | ICD-10-CM | POA: Diagnosis not present

## 2023-08-06 DIAGNOSIS — R6521 Severe sepsis with septic shock: Secondary | ICD-10-CM | POA: Diagnosis not present

## 2023-08-06 DIAGNOSIS — I499 Cardiac arrhythmia, unspecified: Secondary | ICD-10-CM | POA: Diagnosis not present

## 2023-08-06 DIAGNOSIS — J8410 Pulmonary fibrosis, unspecified: Secondary | ICD-10-CM | POA: Diagnosis not present

## 2023-08-06 DIAGNOSIS — R6889 Other general symptoms and signs: Secondary | ICD-10-CM | POA: Diagnosis not present

## 2023-08-06 DIAGNOSIS — Z515 Encounter for palliative care: Secondary | ICD-10-CM

## 2023-08-06 DIAGNOSIS — Z9981 Dependence on supplemental oxygen: Secondary | ICD-10-CM

## 2023-08-06 DIAGNOSIS — E871 Hypo-osmolality and hyponatremia: Secondary | ICD-10-CM | POA: Diagnosis not present

## 2023-08-06 DIAGNOSIS — I3139 Other pericardial effusion (noninflammatory): Secondary | ICD-10-CM | POA: Diagnosis not present

## 2023-08-06 DIAGNOSIS — J441 Chronic obstructive pulmonary disease with (acute) exacerbation: Secondary | ICD-10-CM | POA: Diagnosis present

## 2023-08-06 DIAGNOSIS — Z743 Need for continuous supervision: Secondary | ICD-10-CM | POA: Diagnosis not present

## 2023-08-06 DIAGNOSIS — E8809 Other disorders of plasma-protein metabolism, not elsewhere classified: Secondary | ICD-10-CM | POA: Diagnosis present

## 2023-08-06 DIAGNOSIS — Z79899 Other long term (current) drug therapy: Secondary | ICD-10-CM

## 2023-08-06 DIAGNOSIS — J9621 Acute and chronic respiratory failure with hypoxia: Secondary | ICD-10-CM | POA: Diagnosis not present

## 2023-08-06 DIAGNOSIS — K529 Noninfective gastroenteritis and colitis, unspecified: Secondary | ICD-10-CM | POA: Diagnosis present

## 2023-08-06 DIAGNOSIS — Z87891 Personal history of nicotine dependence: Secondary | ICD-10-CM

## 2023-08-06 DIAGNOSIS — E878 Other disorders of electrolyte and fluid balance, not elsewhere classified: Secondary | ICD-10-CM | POA: Diagnosis present

## 2023-08-06 DIAGNOSIS — Z888 Allergy status to other drugs, medicaments and biological substances status: Secondary | ICD-10-CM

## 2023-08-06 DIAGNOSIS — Z4682 Encounter for fitting and adjustment of non-vascular catheter: Secondary | ICD-10-CM | POA: Diagnosis not present

## 2023-08-06 DIAGNOSIS — Z79890 Hormone replacement therapy (postmenopausal): Secondary | ICD-10-CM

## 2023-08-06 LAB — COMPREHENSIVE METABOLIC PANEL
ALT: 10 U/L (ref 0–44)
AST: 35 U/L (ref 15–41)
Albumin: 2.7 g/dL — ABNORMAL LOW (ref 3.5–5.0)
Alkaline Phosphatase: 77 U/L (ref 38–126)
Anion gap: 15 (ref 5–15)
BUN: 19 mg/dL (ref 8–23)
CO2: 24 mmol/L (ref 22–32)
Calcium: 7.9 mg/dL — ABNORMAL LOW (ref 8.9–10.3)
Chloride: 95 mmol/L — ABNORMAL LOW (ref 98–111)
Creatinine, Ser: 0.8 mg/dL (ref 0.44–1.00)
GFR, Estimated: >60 mL/min (ref >60)
Glucose, Bld: 165 mg/dL — ABNORMAL HIGH (ref 70–99)
Potassium: 4.7 mmol/L (ref 3.5–5.1)
Sodium: 132 mmol/L — ABNORMAL LOW (ref 135–145)
Total Bilirubin: 0.9 mg/dL (ref 0.3–1.2)
Total Protein: 5.5 g/dL — ABNORMAL LOW (ref 6.5–8.1)

## 2023-08-06 LAB — GASTROINTESTINAL PANEL BY PCR, STOOL (REPLACES STOOL CULTURE)

## 2023-08-06 LAB — BLOOD GAS, ARTERIAL
Acid-Base Excess: 6.2 mmol/L — ABNORMAL HIGH (ref 0.0–2.0)
Bicarbonate: 32.7 mmol/L — ABNORMAL HIGH (ref 20.0–28.0)
FIO2: 45 %
MECHVT: 430 mL
Mechanical Rate: 16
O2 Saturation: 98.7 %
PEEP: 5 cmH2O
Patient temperature: 37
pCO2 arterial: 54 mm[Hg] — ABNORMAL HIGH (ref 32–48)
pH, Arterial: 7.39 (ref 7.35–7.45)
pO2, Arterial: 84 mm[Hg] (ref 83–108)

## 2023-08-06 LAB — C DIFFICILE QUICK SCREEN W PCR REFLEX
C Diff antigen: NEGATIVE
C Diff interpretation: NOT DETECTED
C Diff toxin: NEGATIVE

## 2023-08-06 LAB — CBC
HCT: 38.8 % (ref 36.0–46.0)
Hemoglobin: 11.5 g/dL — ABNORMAL LOW (ref 12.0–15.0)
MCH: 28 pg (ref 26.0–34.0)
MCHC: 29.6 g/dL — ABNORMAL LOW (ref 30.0–36.0)
MCV: 94.4 fL (ref 80.0–100.0)
Platelets: 199 10*3/uL (ref 150–400)
RBC: 4.11 MIL/uL (ref 3.87–5.11)
RDW: 13.2 % (ref 11.5–15.5)
WBC: 13.3 10*3/uL — ABNORMAL HIGH (ref 4.0–10.5)
nRBC: 0 % (ref 0.0–0.2)

## 2023-08-06 LAB — TROPONIN I (HIGH SENSITIVITY)
Troponin I (High Sensitivity): 14 ng/L (ref <18)
Troponin I (High Sensitivity): 109 ng/L (ref <18)

## 2023-08-06 SURGERY — CORONARY/GRAFT ACUTE MI REVASCULARIZATION
Anesthesia: Moderate Sedation

## 2023-08-06 MED ORDER — POLYETHYLENE GLYCOL 3350 17 G PO PACK
17.0000 g | PACK | Freq: Every day | ORAL | Status: DC | PRN
Start: 2023-08-06 — End: 2023-08-09

## 2023-08-06 MED ORDER — NOREPINEPHRINE 4 MG/250ML-% IV SOLN
0.0000 ug/min | INTRAVENOUS | Status: DC
  Administered 2023-08-06: 2 ug/min via INTRAVENOUS
  Filled 2023-08-06: qty 250

## 2023-08-06 MED ORDER — PANTOPRAZOLE SODIUM 40 MG IV SOLR
40.0000 mg | Freq: Every day | INTRAVENOUS | Status: DC
  Administered 2023-08-07 (×2): 40 mg via INTRAVENOUS
  Filled 2023-08-06 (×3): qty 10

## 2023-08-06 MED ORDER — ENOXAPARIN SODIUM 30 MG/0.3ML IJ SOSY
30.0000 mg | PREFILLED_SYRINGE | INTRAMUSCULAR | Status: DC
  Filled 2023-08-06: qty 0.3

## 2023-08-06 MED ORDER — IOHEXOL 350 MG/ML SOLN
75.0000 mL | Freq: Once | INTRAVENOUS | Status: AC | PRN
  Administered 2023-08-06: 75 mL via INTRAVENOUS

## 2023-08-06 MED ORDER — SODIUM CHLORIDE 0.9 % IV SOLN: 250.0000 mL | INTRAVENOUS | Status: AC

## 2023-08-06 MED ORDER — SODIUM CHLORIDE 0.9 % IV BOLUS
500.0000 mL | Freq: Once | INTRAVENOUS | Status: AC
  Administered 2023-08-06: 500 mL via INTRAVENOUS

## 2023-08-06 MED ORDER — DOCUSATE SODIUM 100 MG PO CAPS: 100.0000 mg | ORAL_CAPSULE | Freq: Two times a day (BID) | ORAL | Status: DC | PRN

## 2023-08-06 MED ORDER — SODIUM CHLORIDE 0.9 % IV SOLN: 3.0000 g | INTRAVENOUS | Status: DC

## 2023-08-06 MED ORDER — SODIUM CHLORIDE 0.9 % IV SOLN
3.0000 g | Freq: Four times a day (QID) | INTRAVENOUS | Status: DC
  Filled 2023-08-06 (×2): qty 8

## 2023-08-06 MED ORDER — CHLORHEXIDINE GLUCONATE CLOTH 2 % EX PADS
6.0000 | MEDICATED_PAD | Freq: Every day | CUTANEOUS | Status: DC
Start: 2023-08-07 — End: 2023-08-09
  Administered 2023-08-07 – 2023-08-08 (×2): 6 via TOPICAL

## 2023-08-06 MED ORDER — PROPOFOL 1000 MG/100ML IV EMUL
5.0000 ug/kg/min | INTRAVENOUS | Status: DC
  Administered 2023-08-06: 5 ug/kg/min via INTRAVENOUS
  Administered 2023-08-07 – 2023-08-08 (×3): 20 ug/kg/min via INTRAVENOUS
  Filled 2023-08-06 (×3): qty 100

## 2023-08-06 NOTE — ED Provider Notes (Addendum)
[facility] Provider Note    Event Date/Time   First MD Initiated Contact with Patient 08/06/23 1719     (approximate)  History   Chief Complaint: Loss of Consciousness  HPI  Morgan Sandoval is a 88 y.o. female with a past medical history of gastric reflux, hypertension, possible pulmonary hypertension per daughter, presents to the emergency department as a cardiac arrest.  According to EMS patient was found by a bystander in her front yard down.  When they arrived they found the patient to be in asystole and CPR was initiated.  They were able to have return of spontaneous circulation after 4 minutes of CPR.  Patient was intubated with a 7.0 ET tube by EMS prior to arrival, had given 1 epinephrine during the event.  They state shortly after return of spontaneous circulation EKG performed by EMS concerning for STEMI and the patient was activated as a code STEMI and brought to the emergency department.  Upon arrival patient continues to have a pulse, is unresponsive on no sedatives pupils are 2 to 3 mm and nonreactive.  Patient is intubated but occasionally breathing on her own.  Physical Exam   Triage Vital Signs: ED Triage Vitals  Encounter Vitals Group     BP 08/06/23 1711 (!) 83/56     Systolic BP Percentile --      Diastolic BP Percentile --      Pulse Rate 08/06/23 1711 64     Resp 08/06/23 1711 17     Temp --      Temp Source 08/06/23 1711 Axillary     SpO2 08/06/23 1711 100 %     Weight 08/06/23 1722 103 lb 9.9 oz (47 kg)     Height 08/06/23 1722 5\' 3"  (1.6 m)     Head Circumference --      Peak Flow --      Pain Score --      Pain Loc --      Pain Education --      Exclude from Growth Chart --     Most recent vital signs: Vitals:   08/06/23 1711  BP: (!) 83/56  Pulse: 64  Resp: 17  SpO2: 100%    General: Unresponsive 2-70mm pupils nonreactive CV:  Good peripheral perfusion.  Regular rate and rhythm  Resp:  Occasional spontaneous  respiration, intubated with 7.0 ET tube.  Equal breath sounds bilaterally no sounds over epigastrium.  Abd:  Soft, mild distention.  ED Results / Procedures / Treatments   EKG  EKG viewed and interpreted by myself shows a sinus rhythm at 65 bpm with a narrow QRS, normal axis, normal intervals, no concerning ST changes.  No ST elevation.  RADIOLOGY  I reviewed the chest x-ray images.  Patient appears to have opacities throughout both lungs worse on the left this could be pulmonary hemorrhage worse infection.  Awaiting radiology read.   MEDICATIONS ORDERED IN ED: Medications - No data to display   IMPRESSION / MDM / ASSESSMENT AND PLAN / ED COURSE  I reviewed the triage vital signs and the nursing notes.  Patient's presentation is most consistent with acute presentation with potential threat to life or bodily function.  Patient presents to the emergency department via emergency traffic status post cardiac arrest in the field with return of spontaneous circulation after 1 epinephrine and 4 minutes of CPR.  Patient arrived intubated occasionally breathing on her own otherwise assisted with bagging.  2 to 3  mm pupils bilaterally but nonresponsive.  Patient is unresponsive to painful stimuli on no sedatives.  We were able to get a hold of the patient's daughter.  I spoke to the patient's daughter and updated her on the status of the patient.  She believes that the patient has a DO NOT RESUSCITATE order.  She confirms that the patient's pulse were to stop again she would not want cardiac resuscitation.  However as the patient is already intubated she would like the patient to remain intubated and continue with our medical workup until she is able to arrive in 3 to 4 hours as she does not live locally.  We will check labs including cardiac enzymes although patient is EKG is rather well-appearing.  Patient will remain intubated on the ventilator.  Once the daughter arrives we will make further  decisions as far as the patient's status and care plan.  Patient is starting to bite on the tube makes him very minimal movement at times but is not responding to commands or opening her eyes.  We will start the patient on a low-dose of propofol.  Patient's blood pressure is dropping currently around 75 systolic.  Her lab work overall reassuring with a white blood cell count of 13,000 reassuring chemistry and a negative troponin.  I spoke to the daughter she is in agreement to start the patient on pressors until she can get here which would be approximately 3 more hours.  We will start the patient on norepinephrine and admit to the intensivist.    CRITICAL CARE Performed by: Minna Antis   Total critical care time: 30 minutes  Critical care time was exclusive of separately billable procedures and treating other patients.  Critical care was necessary to treat or prevent imminent or life-threatening deterioration.  Critical care was time spent personally by me on the following activities: development of treatment plan with patient and/or surrogate as well as nursing, discussions with consultants, evaluation of patient's response to treatment, examination of patient, obtaining history from patient or surrogate, ordering and performing treatments and interventions, ordering and review of laboratory studies, ordering and review of radiographic studies, pulse oximetry and re-evaluation of patient's condition.   FINAL CLINICAL IMPRESSION(S) / ED DIAGNOSES   Cardiac resuscitation successful Cardiac arrest   Note:  This document was prepared using Dragon voice recognition software and may include unintentional dictation errors.   Minna Antis, MD 08/06/23 1757    Minna Antis, MD 08/06/23 [phone]

## 2023-08-06 NOTE — IPAL (Signed)
  Interdisciplinary Goals of Care Family Meeting   Date carried out: 08/06/2023  Location of the meeting: Phone conference  Member's involved: Physician and Family Member or next of kin  Durable Power of Attorney or acting medical decision maker: Daughter, Morgan Sandoval  Discussion: We discussed goals of care for Morgan Sandoval, explained that she has had a cardiac arrest and is currently ventilated. Daughter uncertain as to her mother's full wishes in a situation like this, thinks she might have had a MOLST with DNR signed, but doesn't have the documentation. Knows that mom would not want to live connected to machines and would not want long term ventilation. We discussed prognosis following one cardiac arrest vs two arrests, explained that chances are slim of any meaningful recovery following a second arrest. She is on her way and will discuss further with the admitting team, but for now would not want further chest compressions should [name] suffer another cardiac arrest.  Code status:   Code Status: Limited: Do not attempt resuscitation (DNR) -DNR-LIMITED -Do Not Intubate/DNI    Disposition: Continue current acute care  Time spent for the meeting: 10 minutes    Raechel Chute, MD  08/06/2023, 7:40 PM

## 2023-08-06 NOTE — ED Notes (Signed)
SBAR report given to [facility], RN on Morgan Sandoval, including patient history, labs, pending imaging and current assessment.

## 2023-08-06 NOTE — ED Notes (Signed)
Attempted 2x to place NGT/OGT. Unsuccessful both times. Dr. Lenard Lance notified.

## 2023-08-06 NOTE — Progress Notes (Signed)
Pharmacy Antibiotic Note  Morgan Sandoval is a 88 y.o. female admitted on 08/06/2023 after being found unresponsive and pulseless at home. ROSC achieved after 4 minutes of CPR. Patient was intubated by EMS prior to arrival to ED. Pharmacy has been consulted for Unasyn dosing for aspiration pneumonia coverage.  Plan: Start Unasyn 3 g IV every 6 hours Monitor renal function, clinical status, culture data, and abx LOT  Height: 5\' 3"  (160 cm) Weight: 47 kg (103 lb 9.9 oz) IBW/kg (Calculated) : 52.4  No data recorded.  Recent Labs  Lab 08/06/23 1713  WBC 13.3*  CREATININE 0.80    Estimated Creatinine Clearance: 37.5 mL/min (by C-G formula based on SCr of 0.8 mg/dL).    Allergies  Allergen Reactions   Hydrochlorothiazide Other (See Comments)    Hyponatremia (10/2021)   Nifedipine Swelling    Leg swelling  Swelling of feet with redness and elevated BP   Sulfa Antibiotics Rash, Hives and Other (See Comments)    Antimicrobials this admission: Unasyn 10/23 >>   Dose adjustments this admission: N/A  Microbiology results: N/A  Thank you for involving pharmacy in this patient's care.   Rockwell Alexandria, PharmD Clinical Pharmacist 08/06/2023 9:04 PM

## 2023-08-06 NOTE — H&P (Signed)
NAME:  Morgan Sandoval, MRN:  [id number], DOB:  Jun 04, 1935, LOS: 0 ADMISSION DATE:  08/04/2023, CONSULTATION DATE:  07/27/2023 REFERRING MD:  Dr. Lenard Lance, CHIEF COMPLAINT:  Cardiac Arrest   History of Present Illness:  88 yo F presenting to [facility] ED from home after an out of hospital cardiac arrest requiring emergent intubation in the field.  History provided per chart review and family bedside report as patient is unable to participate in interview at this time. Patient was in her normal state of health and had been out in her car earlier on 08/10/2023.  After arriving home it appears she had been attempting to enter her house when she fell at the doorway becoming unresponsive and pulseless.  It is unclear how long she was unresponsive and pulseless before being found by a local mailman and receiving bystander CPR.  EMS responded with the initial rhythm notated as asystole.  ROSC was achieved after 4 minutes of EMS CPR and ACLS.  Possible STEMI was called from the field by EMS.  Daughter describes the patient as fiercely independent, living alone managing her own health care and independent of all ADLs.  She has been undergoing a workup for suspected primary bronchogenic carcinoma with metastatic disease, and recently decided to undergo palliative radiation therapy to control tumor growth and provide symptom relief.  Daughter reports primary complaint has been worsening fatigue and when the patient is utilizing portable oxygen feeling as though she is not getting enough air.  The patient is currently on 2 L nasal cannula 24 hours a day and ambulates with a walker.  Daughter denies any recent complaints of fever/chills, abdominal pain/nausea/vomiting, urinary symptoms, chest pain, new cough, falls, blurred vision, headache, or syncope.  Patient does have issues with chronic diarrhea which she controls with a strict diet.  ED course: Upon arrival cardiology reviewed EKG determining patient was not  candidate for PCI.  Vital signs stable on mechanical ventilatory support.  Patient unresponsive with nonreactive pupils initially, propofol drip eventually started due to the patient biting down on the ETT and possible posturing.  EDP spoke with daughter who confirmed DNR/DNI status and was en route from [place].  Initial lab work largely reassuring with mild hyponatremia and hypochloremia, mild hyperglycemia and hypoalbuminemia & leukocytosis.  Chest x-ray concerning for pneumonia versus recurrent pleural effusion, CT head and abdomen pelvis unremarkable, CT angio negative for PE with small pericardial effusion noted, moderate recurrent left pleural effusion and trace right pleural effusion as well as groundglass in the right upper lobe suggestive of pulmonary edema versus aspiration. Medications given: NS bolus 500 mL, IV contrast, propofol drip started Initial Vitals: 97, 16, 72, 107/61 and 96% on 45% FiO2 Significant labs: (Labs/ Imaging personally reviewed) I, Cheryll Cockayne Rust-Chester, AGACNP-BC, personally viewed and interpreted this ECG. EKG Interpretation: Date: 08/10/2023, EKG Time: 17:08, Rate: 65, Rhythm: NSR, QRS Axis: Borderline RAD, Intervals: Normal, ST/T Wave abnormalities: None, Narrative Interpretation: NSR Chemistry: Na+: 132, K+: 4.7, BUN/Cr.: 19/0.80, Serum CO2/ AG: 24/15, albumin: 2.7 Hematology: WBC: 13.3, Hgb: 11.5,  Troponin: 14 > 109, Lactic/ PCT: pending, COVID-19 & Influenza A/B: pending  ABG: 7.39/ 54/ 84/ 32.7 CXR 08/13/2023: Increased bilateral lung opacities left greater than right concerning for worsening pneumonia with probable small left pleural effusion CT head without contrast 08/13/2023: No acute intracranial abnormality CT angio chest PE 08/14/2023: No pulmonary embolus.  Known left lower lobe mass measuring 6.3 x 5.5 cm recently assessed with PET CT. left pleural effusion has increased now  moderate in size, trace right pleural effusion.  Sequela of ILD with  basilar honeycombing.  Slight heterogeneous groundglass in RUL may represent pulmonary edema.  Shotty mediastinal and right hilar adenopathy progressed from prior exam.  New nondisplaced renal fracture.  Nondisplaced right anterior fourth rib fracture.  Chronic T7, T8 and L1 compression fractures CT abdomen pelvis with contrast 08/03/2023: Foley catheter is present in the vagina recommend repositioning.  Possible but not definite wall thickening of descending colon.  Generalized subcutaneous edema of the body wall.  PCCM consulted for admission due to out-of-hospital cardiac arrest requiring emergent intubation and mechanical ventilatory support with concern for anoxic injury.  Pertinent  Medical History  COPD on chronic 2 L nasal cannula ILD Rheumatoid arthritis Left lung mass (suspected primary bronchogenic carcinoma with metastasis and possible pathological fractures) Hypertension Chronic diarrhea Osteoporosis Significant Hospital Events: Including procedures, antibiotic start and stop dates in addition to other pertinent events   08/07/2023: Admit to ICU due to out-of-hospital cardiac arrest requiring emergent intubation and mechanical ventilatory support with concern for anoxic injury.  Possible aspiration, underlying primary metastatic lung cancer/ILD and COPD  Interim History / Subjective:  Propofol drip turned off bedside, no posturing noted.  RASS -4 with slight withdrawal to pain in all extremities.  No eye-opening, reflexes except for corneals appear intact. Long discussion with daughter and son-in-law regarding goals of care and CODE STATUS.  Ultimate decision to maintain DNR and continue to provide current level of acute care for the next 24 to 72 hours depending on clinical status.  If patient continues to deteriorate family considering transitioning to comfort measures. Objective   Blood pressure 115/61, pulse 60, resp. rate 16, height 5\' 3"  (1.6 m), weight 47 kg, SpO2 99%.     Vent Mode: PRVC FiO2 (%):  [45 %] 45 % Set Rate:  [15 bmp-16 bmp] 16 bmp Vt Set:  [430 mL] 430 mL PEEP:  [5 cmH20] 5 cmH20   Intake/Output Summary (Last 24 hours) at 07/25/2023 2032 Last data filed at 07/29/2023 1958 Gross per 24 hour  Intake 513.05 ml  Output --  Net 513.05 ml   Filed Weights   07/30/2023 1722  Weight: 47 kg    Examination: General: Adult female, critically ill, lying in bed intubated & sedated requiring mechanical ventilation, NAD HEENT: MM pink/moist, anicteric, atraumatic, neck supple, nose appears crooked and right eye is slightly off center Neuro: RASS: -4, unable to follow commands, PERRL +3, slight withdrawal to pain in all extremities, no corneals, cough/gag reflex intact CV: s1s2 RRR, NSR on monitor, no r/m/g Pulm: Regular, non labored on PRVC @ 45%/ PEEP 5, breath sounds diminished throughout GI: soft, rounded, non tender, bs x 4 GU: foley in place with clear yellow urine Skin:  no rashes/lesions noted Extremities: warm/dry, pulses + 2 R/P, +2 edema noted BLE  Resolved Hospital Problem list    Assessment & Plan:  Cardiac arrest: initial rhythm asystole PMHx: Hypertension Unknown downtime, CPR and ACLS interventions with EMS total of 4 minutes prior to ROSC - candidate for Normothermia Protocol  - Consider vasopressors: Peripheral levophed PRN, to maintain MAP > 65 - Echocardiogram ordered - Trend troponin, lactic -Not a STEMI candidate on arrival, troponin elevation minimal in the setting of cardiac arrest -consider Cardiology consult depending on echocardiogram and troponin trend overnight - Hold preadmission medication: Lisinopril, carvedilol  Acute on chronic hypoxic respiratory failure secondary to acute encephalopathy in the setting of cardiac arrest Recurrent moderate Left pleural effusion Suspected Aspiration  pneumonia PMHx: Left lung mass suspected to be primary bronchogenic metastatic lung cancer, ILD, COPD on chronic oxygen Patient  scheduled to start palliative radiation therapy.  On CellCept & Plaquenil - Ventilator settings: PRVC 8 mL/kg, 45% FiO2, 5 PEEP - Wean PEEP and FiO2 for sats greater than 90% - Plateau pressures less than 30 cm H20 - VAP bundle in place - Intermittent chest x-ray & ABG - Daily WUA/ SBT as tolerated - Ensure adequate pulmonary hygiene  - F/u cultures, trend PCT -Bronchodilators as needed -Start Zosyn for broad aspiration Pna coverage.  Patient received a dose of Unasyn will broaden to Zosyn for Pseudomonas coverage in the setting of CellCept and Plaquenil use. -Hold CellCept at this time  Leukocytosis suspect secondary to aspiration pneumonia -Daily CBC, monitor WBC/fever curve -Start Zosyn as above -Trend lactic/PCT -Tracheal aspirate and UA ordered  At risk for anoxic encephalopathy unknown downtime before CPR initiated, no defibrillations, 4 min of CPR prior to ROSC Initial head CT: negative - PAD protocol in place: Start propofol drip if needed & fentanyl/ IVP PRN - RASS goal: -1 - Assess EEG  - consider f/u MRI  - Neuro consult if needed   Rheumatoid arthritis -Hold Plaquenil at this time, consider restarting if patient stabilizes  Best Practice (right click and "Reselect all SmartList Selections" daily)  Diet/type: NPO w/ meds via tube DVT prophylaxis: LMWH GI prophylaxis: PPI Lines: N/A Foley:  Yes, and it is still needed Code Status:  DNR Last date of multidisciplinary goals of care discussion [07/31/2023] 07/23/2023: Prolonged discussion with daughter and son-in-law regarding goals of care and CODE STATUS. Ultimate decision to maintain DNR and continue to provide current level of acute care for the next 24 to 72 hours depending on clinical status.  If patient continues to deteriorate family considering transitioning to comfort measures. Labs   CBC: Recent Labs  Lab 08/05/2023 1713  WBC 13.3*  HGB 11.5*  HCT 38.8  MCV 94.4  PLT 199    Basic Metabolic  Panel: Recent Labs  Lab 08/01/2023 1713  NA 132*  K 4.7  CL 95*  CO2 24  GLUCOSE 165*  BUN 19  CREATININE 0.80  CALCIUM 7.9*   GFR: Estimated Creatinine Clearance: 37.5 mL/min (by C-G formula based on SCr of 0.8 mg/dL). Recent Labs  Lab 08/01/2023 1713  WBC 13.3*    Liver Function Tests: Recent Labs  Lab 08/07/2023 1713  AST 35  ALT 10  ALKPHOS 77  BILITOT 0.9  PROT 5.5*  ALBUMIN 2.7*   No results for input(s): "LIPASE", "AMYLASE" in the last 168 hours. No results for input(s): "AMMONIA" in the last 168 hours.  ABG    Component Value Date/Time   PHART 7.39 08/03/2023 1945   PCO2ART 54 (H) 08/03/2023 1945   PO2ART 84 08/13/2023 1945   HCO3 32.7 (H) 07/29/2023 1945   O2SAT 98.7 07/22/2023 1945     Coagulation Profile: No results for input(s): "INR", "PROTIME" in the last 168 hours.  Cardiac Enzymes: No results for input(s): "CKTOTAL", "CKMB", "CKMBINDEX", "TROPONINI" in the last 168 hours.  HbA1C: No results found for: "HGBA1C"  CBG: No results for input(s): "GLUCAP" in the last 168 hours.  Review of Systems:   UTA patient unresponsive unable to participate in interview at this time  Past Medical History:  She,  has a past medical history of Actinic keratosis, Cholelithiasis, Chronic diarrhea, Colon polyp, Diverticulosis, GERD (gastroesophageal reflux disease), Hypertension, Mucoid cyst of joint, and Osteoporosis.   Surgical  History:   Past Surgical History:  Procedure Laterality Date   APPENDECTOMY     BIOPSY  05/19/2019   Procedure: BIOPSY;  Surgeon: Malissa Hippo, MD;  [facility];  Service: Endoscopy;;  sigmoid colon   CHOLECYSTECTOMY     COLON SURGERY     Removed large polyps   COLONOSCOPY     COLONOSCOPY N/A 05/18/2015   Procedure: COLONOSCOPY;  Surgeon: Malissa Hippo, MD;  Location: [facility];  Service: Endoscopy;  Laterality: N/A;  1030   COLONOSCOPY N/A 05/19/2019   Procedure: COLONOSCOPY;  Surgeon: Malissa Hippo, MD;   Location: [facility];  Service: Endoscopy;  Laterality: N/A;  8:30   GANGLION CYST EXCISION Left 03/2019   the fore finger   IR VERTEBROPLASTY LUMBAR BX INC UNI/BIL INC/INJECT/IMAGING  07/27/2020   MASS EXCISION Left 03/02/2019   Procedure: EXCISION CYST, DEBRIDEMENT PROXIMAL INTERPHALANGEAL JOINT LEFT INDEX FINGER;  Surgeon: Cindee Salt, MD;  Location: [facility];  Service: Orthopedics;  Laterality: Left;   RECTAL SURGERY  2000s   [facility]   TONSILLECTOMY       Social History:   reports that she has quit smoking. She has never used smokeless tobacco. She reports current alcohol use. She reports that she does not use drugs.   Family History:  Her family history includes Cancer - Colon in her brother.   Allergies Allergies  Allergen Reactions   Hydrochlorothiazide Other (See Comments)    Hyponatremia (10/2021)   Nifedipine Swelling    Leg swelling  Swelling of feet with redness and elevated BP   Sulfa Antibiotics Rash, Hives and Other (See Comments)     Home Medications  Prior to Admission medications   Medication Sig Start Date End Date Taking? Authorizing Provider  acetaminophen (TYLENOL) 325 MG tablet Take 650 mg by mouth every 4 (four) hours as needed. Patient not taking: Reported on 07/08/2023    [provider]  albuterol (VENTOLIN HFA) 108 (90 Base) MCG/ACT inhaler Inhale 2 puffs into the lungs every 6 (six) hours as needed for wheezing. 08/02/21 07/31/23  [provider]  albuterol (VENTOLIN HFA) 108 (90 Base) MCG/ACT inhaler Inhale into the lungs. 04/01/23   [provider]  alendronate (FOSAMAX) 70 MG tablet Take 70 mg by mouth every Sunday.    [provider]  aspirin EC 81 MG tablet Take 1 tablet (81 mg total) by mouth daily. Swallow whole. Patient not taking: Reported on 07/08/2023 06/12/22   Darlin Priestly, MD  azithromycin (ZITHROMAX) 250 MG tablet Take 250 mg by mouth 3 (three) times a week.    [provider]  carvedilol (COREG) 3.125 MG tablet Take 3.125 mg by mouth 2 (two) times daily.    [provider]  cholecalciferol (VITAMIN D3) 25 MCG (1000 UNIT) tablet Take 1,000 Units by mouth daily.    [provider]  Cyanocobalamin (B-12) 5000 MCG CAPS Take 5,000 mcg by mouth daily.    [provider]  diclofenac sodium (VOLTAREN) 1 % GEL Apply 2 g topically 4 (four) times daily.    [provider]  furosemide (LASIX) 20 MG tablet Take 20 mg by mouth. Patient not taking: Reported on 07/08/2023    [provider]  hydroxychloroquine (PLAQUENIL) 200 MG tablet Take 200 mg by mouth daily.    [provider]  ipratropium (ATROVENT) 0.06 % nasal spray Place into the nose.    [provider]  lisinopril (ZESTRIL) 20 MG  tablet Take 1 tablet (20 mg total) by mouth daily. 06/12/22   Darlin Priestly, MD  melatonin 5 MG TABS Take 5 mg by mouth.    [provider]  Multiple Vitamins-Minerals (PRESERVISION AREDS 2+MULTI VIT) CAPS Take 1 tablet by mouth daily.    [provider]  mycophenolate (CELLCEPT) 500 MG tablet Take by mouth. 07/04/23 07/03/24  [provider]  ondansetron (ZOFRAN) 4 MG tablet Take 4 mg by mouth every 8 (eight) hours as needed for nausea or vomiting. Patient not taking: Reported on 07/08/2023    [provider]  pantoprazole (PROTONIX) 40 MG tablet Take 40 mg by mouth daily.    [provider]  potassium chloride (MICRO-K) 10 MEQ CR capsule Take 10 mEq by mouth daily. Patient not taking: Reported on 07/08/2023    [provider]  silver sulfADIAZINE (SILVADENE) 1 % cream Apply 1 Application topically daily. Apply to left elbow area topically Patient not taking: Reported on 07/08/2023    [provider]     Critical care time: 70 minutes       Betsey Holiday, AGACNP-BC Acute Care Nurse Practitioner [facility]   [phone] /  [phone] Please see Amion for details.

## 2023-08-06 NOTE — ED Notes (Signed)
RT called to place bite block d/t pt biting on tube.

## 2023-08-06 NOTE — ED Triage Notes (Addendum)
Dr. Demetrius Charity on phone with daughter. Per daughter, pt is DNR. Daughter is on the way from [place], [place].

## 2023-08-06 NOTE — Progress Notes (Signed)
PHARMACY CONSULT NOTE - ELECTROLYTES  Pharmacy Consult for Electrolyte Monitoring and Replacement   Recent Labs: Height: 5\' 3"  (160 cm) Weight: 47 kg (103 lb 9.9 oz) IBW/kg (Calculated) : 52.4 Estimated Creatinine Clearance: 37.5 mL/min (by C-G formula based on SCr of 0.8 mg/dL). Potassium (mmol/L)  Date Value  08/06/2023 4.7   Magnesium (mg/dL)  Date Value  16/07/9603 2.0   Calcium (mg/dL)  Date Value  54/06/8118 7.9 (L)   Albumin (g/dL)  Date Value  14/78/2956 2.7 (L)   Phosphorus (mg/dL)  Date Value  21/30/8657 2.6   Sodium  Date Value  08/06/2023 132 mmol/L (L)  07/04/2022 137   Corrected Ca: 8.9 mg/dL  Assessment  Morgan Sandoval is a 88 y.o. female presenting with cardiac arrest. PMH significant for gastric reflux, hypertension, possible pulmonary hypertension . Pharmacy has been consulted to monitor and replace electrolytes.  Diet: NPO MIVF: none Pertinent medications:    Goal of Therapy: Electrolytes WNL  Plan:  No electrolyte replacement at this time Check BMP, Mg, Phos with AM labs  Thank you for allowing pharmacy to be a part of this patient's care.  Angelique Blonder, PharmD Clinical Pharmacist 08/06/2023 7:22 PM

## 2023-08-06 NOTE — ED Triage Notes (Addendum)
Pt presents via [facility] after being found down outside, pulseless and apneic by mailman at [facility] apartments. EMS did obtain ROSC after 4 minutes CPR and intubated pt w 7.0 ETT. 1 epi during event.  Code STEMI called PTA and cardiologist in room on arrival.

## 2023-08-06 NOTE — Progress Notes (Signed)
Called to evaluate patient for possible STEMI.  She was found down at her mailbox at [facility].  EMS called, CPR performed for approximately 6 minutes with ROSC.  Initial ECG revealed asystole.  Brought to [facility] ED where patient was still unresponsive.  ECG revealed sinus rhythm without ischemic ST-T wave changes.  Upon further review, patient noted to be DNR.  Code STEMI canceled.

## 2023-08-06 NOTE — Progress Notes (Signed)
Upon arrival to room Bry[place]leigh arrived by EMS. Chaplain called daughter, Dirk Dress, who lives in [place]. Daughter updated MD regarding Rumor's wishes. Chaplain provided [facility] supportive presence upon her arrival. Daughter reports her mother has been declining over the past year and she has been visiting weekly to offer her support.      08/06/23 2100  Spiritual Encounters  Type of Visit Initial  Care provided to: Pt and family  Conversation partners present during encounter Physician  Referral source Nurse (RN/NT/LPN)  Reason for visit Code  OnCall Visit Yes

## 2023-08-06 NOTE — Progress Notes (Signed)
Pt transported to CT and returned to ED 15 on the vent without incident. Pt remains on the vent and is tol well at this time.

## 2023-08-07 ENCOUNTER — Inpatient Hospital Stay (HOSPITAL_COMMUNITY)
Admit: 2023-08-07 | Discharge: 2023-08-07 | Disposition: A | Discharge status: Home / Self Care | Payer: Medicare Other | Attending: Pulmonary Disease | Admitting: Pulmonary Disease

## 2023-08-07 ENCOUNTER — Ambulatory Visit: Payer: Medicare Other

## 2023-08-07 ENCOUNTER — Inpatient Hospital Stay: Payer: Medicare Other

## 2023-08-07 DIAGNOSIS — I469 Cardiac arrest, cause unspecified: Secondary | ICD-10-CM

## 2023-08-07 LAB — RESPIRATORY PANEL BY PCR

## 2023-08-07 LAB — LACTIC ACID, PLASMA
Lactic Acid, Venous: 1 mmol/L (ref 0.5–1.9)
Lactic Acid, Venous: 1 mmol/L (ref 0.5–1.9)

## 2023-08-07 LAB — BASIC METABOLIC PANEL
Anion gap: 10 (ref 5–15)
BUN: 25 mg/dL — ABNORMAL HIGH (ref 8–23)
CO2: 29 mmol/L (ref 22–32)
Calcium: 8.6 mg/dL — ABNORMAL LOW (ref 8.9–10.3)
Chloride: 96 mmol/L — ABNORMAL LOW (ref 98–111)
Creatinine, Ser: 0.87 mg/dL (ref 0.44–1.00)
GFR, Estimated: >60 mL/min (ref >60)
Glucose, Bld: 123 mg/dL — ABNORMAL HIGH (ref 70–99)
Potassium: 3.6 mmol/L (ref 3.5–5.1)
Sodium: 135 mmol/L (ref 135–145)

## 2023-08-07 LAB — MAGNESIUM: Magnesium: 1.6 mg/dL — ABNORMAL LOW (ref 1.7–2.4)

## 2023-08-07 LAB — URINALYSIS, COMPLETE (UACMP) WITH MICROSCOPIC
Bacteria, UA: NONE SEEN
Bilirubin Urine: NEGATIVE
Glucose, UA: NEGATIVE mg/dL
Hgb urine dipstick: NEGATIVE
Ketones, ur: NEGATIVE mg/dL
Leukocytes,Ua: NEGATIVE
Nitrite: NEGATIVE
Protein, ur: 30 mg/dL — AB
Specific Gravity, Urine: 1.036 — ABNORMAL HIGH (ref 1.005–1.030)
pH: 5 (ref 5.0–8.0)

## 2023-08-07 LAB — BRAIN NATRIURETIC PEPTIDE: B Natriuretic Peptide: 623.4 pg/mL — ABNORMAL HIGH (ref 0.0–100.0)

## 2023-08-07 LAB — BLOOD GAS, ARTERIAL
Acid-Base Excess: 6.4 mmol/L — ABNORMAL HIGH (ref 0.0–2.0)
Bicarbonate: 31.9 mmol/L — ABNORMAL HIGH (ref 20.0–28.0)
FIO2: 45 %
MECHVT: 430 mL
Mechanical Rate: 16
O2 Saturation: 99 %
PEEP: 5 cmH2O
Patient temperature: 37
pCO2 arterial: 48 mm[Hg] (ref 32–48)
pH, Arterial: 7.43 (ref 7.35–7.45)
pO2, Arterial: 144 mm[Hg] — ABNORMAL HIGH (ref 83–108)

## 2023-08-07 LAB — CBC WITH DIFFERENTIAL/PLATELET
Abs Immature Granulocytes: 0.09 10*3/uL — ABNORMAL HIGH (ref 0.00–0.07)
Basophils Absolute: 0 10*3/uL (ref 0.0–0.1)
Basophils Relative: 0 %
Eosinophils Absolute: 0 10*3/uL (ref 0.0–0.5)
Eosinophils Relative: 0 %
HCT: 31.6 % — ABNORMAL LOW (ref 36.0–46.0)
Hemoglobin: 10 g/dL — ABNORMAL LOW (ref 12.0–15.0)
Immature Granulocytes: 1 %
Lymphocytes Relative: 3 %
Lymphs Abs: 0.4 10*3/uL — ABNORMAL LOW (ref 0.7–4.0)
MCH: 27.6 pg (ref 26.0–34.0)
MCHC: 31.6 g/dL (ref 30.0–36.0)
MCV: 87.3 fL (ref 80.0–100.0)
Monocytes Absolute: 0.9 10*3/uL (ref 0.1–1.0)
Monocytes Relative: 5 %
Neutro Abs: 14.4 10*3/uL — ABNORMAL HIGH (ref 1.7–7.7)
Neutrophils Relative %: 91 %
Platelets: 255 10*3/uL (ref 150–400)
RBC: 3.62 MIL/uL — ABNORMAL LOW (ref 3.87–5.11)
RDW: 13.2 % (ref 11.5–15.5)
WBC: 15.8 10*3/uL — ABNORMAL HIGH (ref 4.0–10.5)
nRBC: 0 % (ref 0.0–0.2)

## 2023-08-07 LAB — SARS CORONAVIRUS 2 BY RT PCR: SARS Coronavirus 2 by RT PCR: NEGATIVE

## 2023-08-07 LAB — ECHOCARDIOGRAM COMPLETE
AR max vel: 2.53 cm2
AV Area VTI: 2.58 cm2
AV Area mean vel: 2.72 cm2
AV Mean grad: 5 mm[Hg]
AV Peak grad: 12.1 mm[Hg]
Ao pk vel: 1.74 m/s
Area-P 1/2: 2.88 cm2
Height: 63 in
MV VTI: 2.87 cm2
P 1/2 time: 485 ms
S' Lateral: 1.8 cm
Weight: 1784.84 [oz_av]

## 2023-08-07 LAB — GLUCOSE, CAPILLARY
Glucose-Capillary: 114 mg/dL — ABNORMAL HIGH (ref 70–99)
Glucose-Capillary: 151 mg/dL — ABNORMAL HIGH (ref 70–99)
Glucose-Capillary: 97 mg/dL (ref 70–99)
Glucose-Capillary: 92 mg/dL (ref 70–99)
Glucose-Capillary: 86 mg/dL (ref 70–99)
Glucose-Capillary: 62 mg/dL — ABNORMAL LOW (ref 70–99)
Glucose-Capillary: 107 mg/dL — ABNORMAL HIGH (ref 70–99)
Glucose-Capillary: 76 mg/dL (ref 70–99)

## 2023-08-07 LAB — PROCALCITONIN
Procalcitonin: 4.28 ng/mL
Procalcitonin: 5.96 ng/mL

## 2023-08-07 LAB — MRSA NEXT GEN BY PCR, NASAL: MRSA by PCR Next Gen: NOT DETECTED

## 2023-08-07 LAB — TROPONIN I (HIGH SENSITIVITY)
Troponin I (High Sensitivity): 176 ng/L (ref <18)
Troponin I (High Sensitivity): 178 ng/L (ref <18)

## 2023-08-07 LAB — PHOSPHORUS: Phosphorus: 3.4 mg/dL (ref 2.5–4.6)

## 2023-08-07 MED ORDER — MIDAZOLAM HCL 2 MG/2ML IJ SOLN
INTRAMUSCULAR | Status: AC
  Administered 2023-08-07: 2 mg
  Filled 2023-08-07: qty 2

## 2023-08-07 MED ORDER — ORAL CARE MOUTH RINSE: 15.0000 mL | OROMUCOSAL | Status: DC | PRN

## 2023-08-07 MED ORDER — IPRATROPIUM-ALBUTEROL 0.5-2.5 (3) MG/3ML IN SOLN: 3.0000 mL | RESPIRATORY_TRACT | Status: DC | PRN

## 2023-08-07 MED ORDER — PIPERACILLIN-TAZOBACTAM 3.375 G IVPB
3.3750 g | Freq: Three times a day (TID) | INTRAVENOUS | Status: DC
  Administered 2023-08-07 – 2023-08-08 (×5): 3.375 g via INTRAVENOUS
  Filled 2023-08-07 (×5): qty 50

## 2023-08-07 MED ORDER — FENTANYL CITRATE PF 50 MCG/ML IJ SOSY: 25.0000 ug | PREFILLED_SYRINGE | INTRAMUSCULAR | Status: DC | PRN

## 2023-08-07 MED ORDER — MAGNESIUM SULFATE 2 GM/50ML IV SOLN
2.0000 g | Freq: Once | INTRAVENOUS | Status: AC
  Administered 2023-08-07: 2 g via INTRAVENOUS
  Filled 2023-08-07: qty 50

## 2023-08-07 MED ORDER — DOCUSATE SODIUM 50 MG/5ML PO LIQD: 100.0000 mg | Freq: Two times a day (BID) | ORAL | Status: DC

## 2023-08-07 MED ORDER — LEVETIRACETAM IN NACL 1000 MG/100ML IV SOLN
1000.0000 mg | Freq: Once | INTRAVENOUS | Status: AC
  Administered 2023-08-07: 1000 mg via INTRAVENOUS
  Filled 2023-08-07: qty 100

## 2023-08-07 MED ORDER — LEVETIRACETAM IN NACL 500 MG/100ML IV SOLN
500.0000 mg | Freq: Two times a day (BID) | INTRAVENOUS | Status: DC
  Administered 2023-08-07 – 2023-08-08 (×2): 500 mg via INTRAVENOUS
  Filled 2023-08-07 (×2): qty 100

## 2023-08-07 MED ORDER — POLYVINYL ALCOHOL 1.4 % OP SOLN
OPHTHALMIC | Status: DC | PRN
  Filled 2023-08-07: qty 15

## 2023-08-07 MED ORDER — DEXTROSE 50 % IV SOLN
12.5000 g | INTRAVENOUS | Status: AC
  Administered 2023-08-07: 12.5 g via INTRAVENOUS
  Filled 2023-08-07: qty 50

## 2023-08-07 MED ORDER — FENTANYL CITRATE PF 50 MCG/ML IJ SOSY
25.0000 ug | PREFILLED_SYRINGE | INTRAMUSCULAR | Status: DC | PRN
Start: 2023-08-07 — End: 2023-08-08

## 2023-08-07 MED ORDER — ENOXAPARIN SODIUM 40 MG/0.4ML IJ SOSY
40.0000 mg | PREFILLED_SYRINGE | INTRAMUSCULAR | Status: DC
  Administered 2023-08-07 – 2023-08-08 (×2): 40 mg via SUBCUTANEOUS
  Filled 2023-08-07 (×2): qty 0.4

## 2023-08-07 MED ORDER — POLYETHYLENE GLYCOL 3350 17 G PO PACK: 17.0000 g | PACK | Freq: Every day | ORAL | Status: DC

## 2023-08-07 MED ORDER — MIDAZOLAM HCL 2 MG/2ML IJ SOLN
1.0000 mg | INTRAMUSCULAR | Status: DC | PRN
  Administered 2023-08-07 (×2): 2 mg via INTRAVENOUS
  Filled 2023-08-07 (×2): qty 2

## 2023-08-07 MED ORDER — ORAL CARE MOUTH RINSE
15.0000 mL | OROMUCOSAL | Status: DC
  Administered 2023-08-07 – 2023-08-08 (×19): 15 mL via OROMUCOSAL

## 2023-08-07 MED ORDER — LACTATED RINGERS IV BOLUS
250.0000 mL | Freq: Once | INTRAVENOUS | Status: AC
  Administered 2023-08-07: 250 mL via INTRAVENOUS

## 2023-08-07 NOTE — Progress Notes (Signed)
Transition of Care [facility]) - Inpatient Brief Assessment   Patient Details  Name: Morgan Sandoval MRN: [id number] Date of Birth: [birth year]  Transition of Care [facility] CM/SW Contact:    Truddie Hidden, RN Phone Number: 08/07/2023, 3:13 PM   Clinical Narrative: TOC continuing to follow patient's progress throughout discharge planning.   Transition of Care Asessment: Insurance and Status: Insurance coverage has been reviewed Patient has primary care physician: Yes Home environment has been reviewed: Home Prior level of function:: Independent Prior/Current Home Services: No current home services Social Determinants of Health Reivew: SDOH reviewed no interventions necessary Readmission risk has been reviewed: Yes Transition of care needs: no transition of care needs at this time

## 2023-08-07 NOTE — Plan of Care (Signed)
  Problem: Clinical Measurements: Goal: Cardiovascular complication will be avoided Outcome: Progressing   Problem: Elimination: Goal: Will not experience complications related to urinary retention Outcome: Progressing   Problem: Pain Management: Goal: General experience of comfort will improve Outcome: Progressing   Problem: Safety: Goal: Ability to remain free from injury will improve Outcome: Progressing   Problem: Skin Integrity: Goal: Risk for impaired skin integrity will decrease Outcome: Progressing   Problem: Education: Goal: Knowledge of General Education information will improve Description: Including pain rating scale, medication(s)/side effects and non-pharmacologic comfort measures Outcome: Not Progressing   Problem: Health Behavior/Discharge Planning: Goal: Ability to manage health-related needs will improve Outcome: Not Progressing   Problem: Clinical Measurements: Goal: Respiratory complications will improve Outcome: Not Progressing   Problem: Activity: Goal: Risk for activity intolerance will decrease Outcome: Not Progressing   Problem: Nutrition: Goal: Adequate nutrition will be maintained Outcome: Not Progressing

## 2023-08-07 NOTE — Progress Notes (Signed)
Pharmacy Antibiotic Note  Morgan Sandoval is a 88 y.o. female admitted on 08/06/2023 with  aspiration PNA .  Pharmacy has been consulted for Zosyn dosing.  Plan: Zosyn 3.375g IV q8h (4 hour infusion).  Height: 5\' 3"  (160 cm) Weight: 50.4 kg (111 lb 1.8 oz) IBW/kg (Calculated) : 52.4  Temp (24hrs), Avg:98.1 F (36.7 C), Min:97.5 F (36.4 C), Max:98.6 F (37 C)  Recent Labs  Lab 08/06/23 1713 08/07/23 0030  WBC 13.3*  --   CREATININE 0.80  --   LATICACIDVEN  --  1.0    Estimated Creatinine Clearance: 40.2 mL/min (by C-G formula based on SCr of 0.8 mg/dL).    Allergies  Allergen Reactions   Hydrochlorothiazide Other (See Comments)    Hyponatremia (10/2021)   Nifedipine Swelling    Leg swelling  Swelling of feet with redness and elevated BP   Sulfa Antibiotics Rash, Hives and Other (See Comments)    Antimicrobials this admission:   >>    >>   Dose adjustments this admission:   Microbiology results:  BCx:   UCx:    Sputum:    MRSA PCR:   Thank you for allowing pharmacy to be a part of this patient's care.  Larron Armor D 08/07/2023 2:28 AM

## 2023-08-07 NOTE — Progress Notes (Signed)
Eeg done 

## 2023-08-07 NOTE — Progress Notes (Signed)
Pt transported to ICU 20 on the vent without incident. Pt remains on the vent and is tol well at this time. Report given to ICU RT.

## 2023-08-07 NOTE — Progress Notes (Addendum)
Myoclonic jerking At risk for anoxic injury secondary to cardiac arrest Patient noted to have decerebrate posturing, rigidity and full body intermittent myoclonic jerking.  2 mg of Versed stopped myoclonus activity. -Versed IVP 1-2 mg PRN -Restart propofol drip, wean as tolerated -Keppra load ordered followed by twice daily dosing -EEG ordered for a.m. -Neurology consulted appreciate input  Updated family: Daughter, son-in-law, son, daughter-in-law and granddaughter regarding change in status.  All questions and concerns answered at this time.   Cheryll Cockayne Rust-Chester, AGACNP-BC Acute Care Nurse Practitioner [facility]   [phone] / [phone] Please see Amion for details.

## 2023-08-07 NOTE — Plan of Care (Signed)
  Problem: Clinical Measurements: Goal: Ability to maintain clinical measurements within normal limits will improve Outcome: Progressing Goal: Cardiovascular complication will be avoided Outcome: Progressing   Problem: Education: Goal: Knowledge of General Education information will improve Description: Including pain rating scale, medication(s)/side effects and non-pharmacologic comfort measures Outcome: Not Progressing   Problem: Activity: Goal: Risk for activity intolerance will decrease Outcome: Not Progressing   Problem: Nutrition: Goal: Adequate nutrition will be maintained Outcome: Not Progressing

## 2023-08-07 NOTE — Progress Notes (Signed)
PHARMACY CONSULT NOTE - ELECTROLYTES  Pharmacy Consult for Electrolyte Monitoring and Replacement   Recent Labs: Height: 5\' 3"  (160 cm) Weight: 50.6 kg (111 lb 8.8 oz) IBW/kg (Calculated) : 52.4 Estimated Creatinine Clearance: 37.1 mL/min (by C-G formula based on SCr of 0.87 mg/dL).  Potassium (mmol/L)  Date Value  08/07/2023 3.6   Magnesium (mg/dL)  Date Value  08/65/7846 1.6 (L)   Calcium (mg/dL)  Date Value  96/29/5284 8.6 (L)   Albumin (g/dL)  Date Value  13/24/4010 2.7 (L)   Phosphorus (mg/dL)  Date Value  27/25/3664 3.4   Sodium  Date Value  08/07/2023 135 mmol/L  07/04/2022 137   Corrected Ca: 9.64 mg/dL  Assessment  Morgan Sandoval is a 88 y.o. female presenting with cardiac arrest. PMH significant for gastric reflux, hypertension, possible pulmonary hypertension. Pharmacy has been consulted to monitor and replace electrolytes.  Diet: NPO MIVF: none  Goal of Therapy: Electrolytes WNL  Plan:  Mag 1.6; will order magnesium sulfate 2 gm IV x 1 Check BMP, Mg, Phos with AM labs  Thank you for allowing pharmacy to be a part of this patient's care.  Littie Deeds, PharmD Pharmacy Resident  08/07/2023 7:27 AM

## 2023-08-07 NOTE — Progress Notes (Signed)
Initial Nutrition Assessment  DOCUMENTATION CODES:   Severe malnutrition in context of chronic illness  INTERVENTION:   If tube feeds initiated, recommend:  Vital 1.2@45ml /hr- Initiate at 16ml/hr and increase by 96ml/hr q 8 hours until goal rate is reached.   Free water flushes 30ml q4 hours to maintain tube patency   Regimen provides 1296kcal/day, 81g/day protein and 1027ml/day of free water.   Liquid MVI daily via tube   Pt at high refeed risk; recommend monitor potassium, magnesium and phosphorus labs daily until stable  Daily weights   NUTRITION DIAGNOSIS:   Severe Malnutrition related to chronic illness as evidenced by severe fat depletion, severe muscle depletion.  GOAL:   Provide needs based on ASPEN/SCCM guidelines  MONITOR:   Vent status, Weight trends, Labs, TF tolerance, I & O's, Skin  REASON FOR ASSESSMENT:   Ventilator    ASSESSMENT:   88 y/o female with h/o pulmonary fibrosis/ILD/COPD, new lung mass suspected to be malignant, RA, GERD, HTN and esophageal stenosis s/p dilation who is admitted s/p cardiac arrest with concerns for ABI.  Pt sedated and ventilated. OGT unable to be placed. Family at bedside reports pt with fairly good appetite and oral intake at baseline. Family reports that pt does not eat a lot but she does try to eat healthy and she does always eat because she knows she needs to. Pt enjoys a lot of vegetables, fish and chicken; pt does not really eat red meat as much. Pt does drink Ensure daily at home. Family reports that patient has been losing weight over the past several months. Per chart, pt appears weight stable pta. Pt with concerns for ABI; will initiate tube feeds if plan is for full aggressive care.   Medications reviewed and include: colace, lovenox, protonix, miralax, zosyn, propofol   Labs reviewed: K 3.6 wnl, BUN 25(H), P 3.4 wnl, Mg 1.6(L) BNP- 623.4(H) Wbc- 15.8(H), Hgb 10.0(L), Hct 31.6(H) Cbgs- 97, 114 x 24 hrs    Patient is currently intubated on ventilator support MV: 7.0 L/min Temp (24hrs), Avg:97.9 F (36.6 C), Min:97.5 F (36.4 C), Max:98.6 F (37 C)  Propofol: 5.64 ml/hr- provides 149kcal/day   MAP- >34mmHg   UOP-   NUTRITION - FOCUSED PHYSICAL EXAM:  Flowsheet Row Most Recent Value  Orbital Region Moderate depletion  Upper Arm Region Severe depletion  Thoracic and Lumbar Region Severe depletion  Buccal Region Moderate depletion  Temple Region Moderate depletion  Clavicle Bone Region Moderate depletion  Clavicle and Acromion Bone Region Moderate depletion  Scapular Bone Region Moderate depletion  Dorsal Hand Moderate depletion  Patellar Region Severe depletion  Anterior Thigh Region Severe depletion  Posterior Calf Region Severe depletion  Edema (RD Assessment) None  Hair Reviewed  Eyes Reviewed  Mouth Reviewed  Skin Reviewed  Nails Reviewed   Diet Order:   Diet Order             Diet NPO time specified  Diet effective now                  EDUCATION NEEDS:   Not appropriate for education at this time  Skin:  Skin Assessment: Reviewed RN Assessment (petechiae)  Last BM:  10/24- TYPE 7  Height:   Ht Readings from Last 1 Encounters:  08/07/23 5\' 3"  (1.6 m)    Weight:   Wt Readings from Last 1 Encounters:  08/07/23 50.6 kg    Ideal Body Weight:  52.2 kg  BMI:  Body mass index is  19.76 kg/m.  Estimated Nutritional Needs:   Kcal:  1068kcal/day  Protein:  75-85g/day  Fluid:  1.3-1.5L/day  Betsey Holiday MS, RD, LDN Please refer to [facility] for RD and/or RD on-call/weekend/after hours pager

## 2023-08-07 NOTE — Progress Notes (Signed)
NAME:  Morgan Sandoval, MRN:  [id number], DOB:  1935/04/20, LOS: 1 ADMISSION DATE:  09/02/23  History of Present Illness:  Unfortunate case of an 88 year old female patient with a recent history of primary lung malignancy, COPD and pulmonary fibrosis presenting to [facility] for hypoxic PEA arrest requiring intubation and mechanical ventilation.  Significant Hospital Events: Including procedures, antibiotic start and stop dates in addition to other pertinent events   Admitted overnight intubated.  Goals of care were discussed with family overnight and patient was made DNR.  Also noted to have myoclonic jerks overnight requiring sedation with Versed and propofol. Objective   Blood pressure (!) 108/51, pulse 76, temperature 98.4 F (36.9 C), resp. rate 18, height 5\' 3"  (1.6 m), weight 50.6 kg, SpO2 93%.    Vent Mode: PRVC FiO2 (%):  [24 %-45 %] 24 % Set Rate:  [15 bmp-16 bmp] 16 bmp Vt Set:  [430 mL] 430 mL PEEP:  [5 cmH20] 5 cmH20 Plateau Pressure:  [22 cmH20] 22 cmH20   Intake/Output Summary (Last 24 hours) at 08/07/2023 1329 Last data filed at 08/07/2023 0800 Gross per 24 hour  Intake 713.98 ml  Output 315 ml  Net 398.98 ml   Filed Weights   02-Sep-2023 1722 08/07/23 0000 08/07/23 0448  Weight: 47 kg 50.4 kg 50.6 kg   Examination: General: Intubated, not responsive, sedated. HENT: Sluggish pupils Lungs: Coarse breath sounds bilaterally Cardiovascular: Normal S1 normal S2 regular rate and rhythm Abdomen: Soft positive bowel sounds Extremities: Warm well-perfused no edema  Labs and imaging were reviewed and  Assessment & Plan:  Unfortunate case of an 88 year old female patient with a recent history of primary lung malignancy, COPD and pulmonary fibrosis presenting to [facility] for hypoxic PEA arrest requiring intubation and mechanical ventilation.  # PEA arrest likely secondary to hypoxic respiratory failure in the setting of lung cancer COPD and possible community-acquired  pneumonia.  CT PE ruled out pulmonary emboli. # Shock likely distributive in setting of septic shock.  Possible source is pulmonary. # Hypoxic respiratory failure due to the above intubated mechanically ventilated. # Suspicion for primary lung neoplasm based on PET scan done as outpatient. # Myoclonic jerks due to hypoxic cardiac arrest  Neuro: Continue with propofol and Versed as needed.  Fentanyl as needed for pain.  Continue with Keppra 500 mg twice daily. Cardiovascular: Continue with norepinephrine to maintain maps of greater than 65. Pulmonary: Continue mechanical ventilation for O2 sats 89 to 92%.  Zosyn for broad-spectrum antibiotics. Renal: Monitor urine output avoid nephrotoxic agents. Endo: POC's 1 40-1 80 Heme: Lovenox for DVT prophylaxis GI: PPI for stress ulcer prophylaxis  Best Practice (right click and "Reselect all SmartList Selections" daily)   Diet/type: NPO DVT prophylaxis: LMWH GI prophylaxis: PPI Lines: N/A Foley:  Yes, and it is still needed Code Status:  DNR Last date of multidisciplinary goals of care discussion [10/07/2023]  Overall prognosis is very poor.  I had a discussion with her daughter at bedside regarding next steps including comfort measures only given the prognosis and the low likelihood patient returning to her quality of life which she seems to appreciate significantly.  Her daughter asked for more time and to obtain the EEG and discussed with further family members.  Continue current care with DNR.  I spent 60 minutes caring for this patient today, including preparing to see the patient, obtaining a medical history , reviewing a separately obtained history, performing a medically appropriate examination and/or evaluation, counseling and educating the patient/family/caregiver,  ordering medications, tests, or procedures, documenting clinical information in the electronic health record, and independently interpreting results (not separately  reported/billed) and communicating results to the patient/family/caregiver  Janann Colonel, MD [place] [facility] 08/07/2023 1:39 PM

## 2023-08-07 NOTE — Progress Notes (Signed)
eLink Physician-Brief Progress Note Patient Name: Morgan Sandoval DOB: 1935/05/24 MRN: [id number]   Date of Service  08/07/2023  HPI/Events of Note  Patient admitted to the ICU comatose, intubated and on the ventilator following out of hospital cardiac arrest, work up in progress.  eICU Interventions  New Patient Evaluation.        Thomasene Lot Giani Winther 08/07/2023, 12:16 AM

## 2023-08-07 NOTE — Procedures (Addendum)
Routine EEG Report  Morgan Sandoval is a 88 y.o. female with a history of cardiac arrest who is undergoing an EEG to evaluate for seizures.  Report: This EEG was acquired with electrodes placed according to the International 10-20 electrode system (including Fp1, Fp2, F3, F4, C3, C4, P3, P4, O1, O2, T3, T4, T5, T6, A1, A2, Fz, Cz, Pz). The following electrodes were missing or displaced: none.  The best background was continuous low-amplitude activity at 3-6 Hz. This activity is not clearly reactive to stimulation (patient is on propofol during the study). No clear waking rhythm or sleep architecture was identified. There was no focal slowing. There were no interictal epileptiform discharges. There were no electrographic seizures identified.   Impression and clinical correlation: This EEG was obtained while comatose and sedated on propofol and is abnormal due to moderate to severe diffuse slowing indicative of global cerebral dysfunction, medication effect, or both.  Epileptiform abnormalities were not seen during this recording.  Bing Neighbors, MD [facility] [phone]  If 7pm- 7am, please page neurology on call as listed in AMION.

## 2023-08-08 ENCOUNTER — Ambulatory Visit: Payer: Medicare Other

## 2023-08-08 ENCOUNTER — Ambulatory Visit: Admission: RE | Admit: 2023-08-08 | Payer: Medicare Other | Source: Ambulatory Visit

## 2023-08-08 DIAGNOSIS — G253 Myoclonus: Secondary | ICD-10-CM

## 2023-08-08 DIAGNOSIS — R402431 Glasgow coma scale score 3-8, in the field [EMT or ambulance]: Secondary | ICD-10-CM | POA: Diagnosis not present

## 2023-08-08 DIAGNOSIS — I469 Cardiac arrest, cause unspecified: Secondary | ICD-10-CM | POA: Diagnosis not present

## 2023-08-08 DIAGNOSIS — E43 Unspecified severe protein-calorie malnutrition: Secondary | ICD-10-CM | POA: Insufficient documentation

## 2023-08-08 DIAGNOSIS — G931 Anoxic brain damage, not elsewhere classified: Secondary | ICD-10-CM | POA: Diagnosis not present

## 2023-08-08 LAB — CBC
HCT: 35 % — ABNORMAL LOW (ref 36.0–46.0)
Hemoglobin: 11.2 g/dL — ABNORMAL LOW (ref 12.0–15.0)
MCH: 28.3 pg (ref 26.0–34.0)
MCHC: 32 g/dL (ref 30.0–36.0)
MCV: 88.4 fL (ref 80.0–100.0)
Platelets: 261 10*3/uL (ref 150–400)
RBC: 3.96 MIL/uL (ref 3.87–5.11)
RDW: 13.7 % (ref 11.5–15.5)
WBC: 15.7 10*3/uL — ABNORMAL HIGH (ref 4.0–10.5)
nRBC: 0 % (ref 0.0–0.2)

## 2023-08-08 LAB — BASIC METABOLIC PANEL
Anion gap: 12 (ref 5–15)
BUN: 21 mg/dL (ref 8–23)
CO2: 28 mmol/L (ref 22–32)
Calcium: 8.6 mg/dL — ABNORMAL LOW (ref 8.9–10.3)
Chloride: 97 mmol/L — ABNORMAL LOW (ref 98–111)
Creatinine, Ser: 0.87 mg/dL (ref 0.44–1.00)
GFR, Estimated: >60 mL/min (ref >60)
Glucose, Bld: 87 mg/dL (ref 70–99)
Potassium: 3.7 mmol/L (ref 3.5–5.1)
Sodium: 137 mmol/L (ref 135–145)

## 2023-08-08 LAB — GLUCOSE, CAPILLARY
Glucose-Capillary: 79 mg/dL (ref 70–99)
Glucose-Capillary: 57 mg/dL — ABNORMAL LOW (ref 70–99)
Glucose-Capillary: 96 mg/dL (ref 70–99)
Glucose-Capillary: 80 mg/dL (ref 70–99)
Glucose-Capillary: 91 mg/dL (ref 70–99)

## 2023-08-08 LAB — TRIGLYCERIDES: Triglycerides: 83 mg/dL (ref <150)

## 2023-08-08 LAB — PHOSPHORUS: Phosphorus: 3.8 mg/dL (ref 2.5–4.6)

## 2023-08-08 LAB — MAGNESIUM: Magnesium: 2.1 mg/dL (ref 1.7–2.4)

## 2023-08-08 LAB — PROCALCITONIN: Procalcitonin: 6.03 ng/mL

## 2023-08-08 MED ORDER — GLYCOPYRROLATE 1 MG PO TABS: 1.0000 mg | ORAL_TABLET | ORAL | Status: DC | PRN

## 2023-08-08 MED ORDER — GLYCOPYRROLATE 0.2 MG/ML IJ SOLN: 0.2000 mg | INTRAMUSCULAR | Status: DC | PRN

## 2023-08-08 MED ORDER — LEVETIRACETAM IN NACL 1000 MG/100ML IV SOLN
1000.0000 mg | Freq: Two times a day (BID) | INTRAVENOUS | Status: DC
  Administered 2023-08-08: 1000 mg via INTRAVENOUS
  Filled 2023-08-08 (×2): qty 100

## 2023-08-08 MED ORDER — HYDROMORPHONE BOLUS VIA INFUSION
1.0000 mg | INTRAVENOUS | Status: DC | PRN
  Administered 2023-08-08: 1 mg via INTRAVENOUS

## 2023-08-08 MED ORDER — MIDAZOLAM HCL 2 MG/2ML IJ SOLN
2.0000 mg | INTRAMUSCULAR | Status: DC | PRN
  Administered 2023-08-08: 4 mg via INTRAVENOUS
  Filled 2023-08-08: qty 4

## 2023-08-08 MED ORDER — DEXTROSE 10 % IV SOLN: INTRAVENOUS | Status: DC

## 2023-08-08 MED ORDER — HYDROMORPHONE HCL-NACL 50-0.9 MG/50ML-% IV SOLN
4.0000 mg/h | INTRAVENOUS | Status: DC
  Administered 2023-08-08: 1 mg/h via INTRAVENOUS
  Filled 2023-08-08: qty 50

## 2023-08-08 MED ORDER — DEXTROSE 50 % IV SOLN
12.5000 g | INTRAVENOUS | Status: AC
  Administered 2023-08-08: 12.5 g via INTRAVENOUS
  Filled 2023-08-08: qty 50

## 2023-08-08 MED ORDER — GLYCOPYRROLATE 0.2 MG/ML IJ SOLN
0.2000 mg | INTRAMUSCULAR | Status: DC | PRN
  Administered 2023-08-08: 0.2 mg via INTRAVENOUS
  Filled 2023-08-08: qty 1

## 2023-08-08 MED ORDER — LEVETIRACETAM IN NACL 500 MG/100ML IV SOLN
500.0000 mg | Freq: Once | INTRAVENOUS | Status: AC
  Administered 2023-08-08: 500 mg via INTRAVENOUS
  Filled 2023-08-08: qty 100

## 2023-08-08 MED ORDER — HYDROMORPHONE HCL 1 MG/ML IJ SOLN: 2.0000 mg | INTRAMUSCULAR | Status: DC | PRN

## 2023-08-11 ENCOUNTER — Encounter: Payer: Self-pay | Admitting: *Deleted

## 2023-08-15 NOTE — H&P (Deleted)
NEUROLOGY CONSULTATION NOTE   Date of service: August 08, 2023 Patient Name: Morgan Sandoval MRN:  [id number] DOB:  [birth year] Reason for consult: myoclonus, prognostication after cardiac arrest Requesting physician: Dr. Janann Colonel _ _ _   _ __   _ __ _ _  __ __   _ __   __ _  History of Present Illness   This is an 88 year old woman with a past medical history significant for hypertension and osteoporosis who presents after an out-of-hospital cardiac arrest.  She was in her normal state of health day of admission 08/15/2023.  She was arriving home from driving her car when she fell at the doorway becoming unresponsive and pulseless.  Is unclear how long she was unresponsive and pulseless before being found by a local mailman and receiving bystander CPR.  When EMS responded the initial rhythm was noted as asystole.  ROSC was achieved after 4 minutes of EMS CPR and ACLS.  At baseline patient is very panicked, lives alone, manages her own healthcare, independent in all ADLs.  She has been undergoing workup for suspected primary bronchogenic carcinoma with metastatic disease and recently decided to undergo a palliative radiation to control tumor growth and provide symptom relief.  More recently she has had progressive fatigue and requires 2 L nasal cannula O2 24 hours a day and ambulates with a walker.  Neurology is consulted for evaluation and management of postanoxic myoclonus as well as prognostication after cardiac arrest.  ROS   UTA 2/2 patient is comatose  Past History   I have reviewed the following:  Past Medical History:  Diagnosis Date   Actinic keratosis    Cholelithiasis    Chronic diarrhea    Colon polyp    Diverticulosis    GERD (gastroesophageal reflux disease)    if she eats acidic foods   Hypertension    Mucoid cyst of joint    left index finger   Osteoporosis    Past Surgical History:  Procedure Laterality Date   APPENDECTOMY     BIOPSY  05/19/2019    Procedure: BIOPSY;  Surgeon: Malissa Hippo, MD;  Location: [facility];  Service: Endoscopy;;  sigmoid colon   CHOLECYSTECTOMY     COLON SURGERY     Removed large polyps   COLONOSCOPY     COLONOSCOPY N/A 05/18/2015   Procedure: COLONOSCOPY;  Surgeon: Malissa Hippo, MD;  Location: [facility];  Service: Endoscopy;  Laterality: N/A;  1030   COLONOSCOPY N/A 05/19/2019   Procedure: COLONOSCOPY;  Surgeon: Malissa Hippo, MD;  Location: [facility];  Service: Endoscopy;  Laterality: N/A;  8:30   GANGLION CYST EXCISION Left 03/2019   the fore finger   IR VERTEBROPLASTY LUMBAR BX INC UNI/BIL INC/INJECT/IMAGING  07/27/2020   MASS EXCISION Left 03/02/2019   Procedure: EXCISION CYST, DEBRIDEMENT PROXIMAL INTERPHALANGEAL JOINT LEFT INDEX FINGER;  Surgeon: Cindee Salt, MD;  Location: [facility];  Service: Orthopedics;  Laterality: Left;   RECTAL SURGERY  2000s   [facility]   TONSILLECTOMY     Family History  Problem Relation Age of Onset   Cancer - Colon Brother    Social History   Socioeconomic History   Marital status: Widowed    Spouse name: Not on file   Number of children: Not on file   Years of education: Not on file   Highest education level: Not on file  Occupational History   Not on file  Tobacco Use  Smoking status: Former   Smokeless tobacco: Never  Vaping Use   Vaping status: Never Used  Substance and Sexual Activity   Alcohol use: Yes    Comment: occ   Drug use: No   Sexual activity: Not on file  Other Topics Concern   Not on file  Social History Narrative   Not on file   Social Determinants of Health   Financial Resource Strain: Low Risk  (05/13/2023)   Received from [facility]   Overall Financial Resource Strain (CARDIA)    Difficulty of Paying Living Expenses: Not hard at all  Food Insecurity: No Food Insecurity (07/08/2023)   Hunger Vital Sign    Worried About Running Out of Food in the Last Year: Never true     Ran Out of Food in the Last Year: Never true  Transportation Needs: Unknown (07/08/2023)   PRAPARE - Administrator, Civil Service (Medical): No    Lack of Transportation (Non-Medical): Not on file  Physical Activity: Not on file  Stress: Not on file  Social Connections: Not on file   Allergies  Allergen Reactions   Hydrochlorothiazide Other (See Comments)    Hyponatremia (10/2021)   Nifedipine Swelling    Leg swelling  Swelling of feet with redness and elevated BP   Sulfa Antibiotics Rash, Hives and Other (See Comments)    Medications   Medications Prior to Admission  Medication Sig Dispense Refill Last Dose   acetaminophen (TYLENOL) 325 MG tablet Take 650 mg by mouth every 4 (four) hours as needed.   prn   albuterol (VENTOLIN HFA) 108 (90 Base) MCG/ACT inhaler Inhale into the lungs.      alendronate (FOSAMAX) 70 MG tablet Take 70 mg by mouth every Sunday.   08/03/2023   azithromycin (ZITHROMAX) 250 MG tablet Take 250 mg by mouth 3 (three) times a week.   unk   carvedilol (COREG) 3.125 MG tablet Take 3.125 mg by mouth 2 (two) times daily.      cholecalciferol (VITAMIN D3) 25 MCG (1000 UNIT) tablet Take 1,000 Units by mouth daily.      Cyanocobalamin (B-12) 5000 MCG CAPS Take 5,000 mcg by mouth daily.      diclofenac sodium (VOLTAREN) 1 % GEL Apply 2 g topically 4 (four) times daily.   unk   hydroxychloroquine (PLAQUENIL) 200 MG tablet Take 200 mg by mouth daily.      ipratropium (ATROVENT) 0.06 % nasal spray Place into the nose.      lisinopril (ZESTRIL) 20 MG tablet Take 1 tablet (20 mg total) by mouth daily.   unk   melatonin 5 MG TABS Take 5 mg by mouth.      Multiple Vitamins-Minerals (PRESERVISION AREDS 2+MULTI VIT) CAPS Take 1 tablet by mouth daily.      mycophenolate (CELLCEPT) 500 MG tablet Take by mouth.      ondansetron (ZOFRAN) 4 MG tablet Take 4 mg by mouth every 8 (eight) hours as needed for nausea or vomiting.   Past Month   pantoprazole (PROTONIX) 40 MG  tablet Take 40 mg by mouth daily.      albuterol (VENTOLIN HFA) 108 (90 Base) MCG/ACT inhaler Inhale 2 puffs into the lungs every 6 (six) hours as needed for wheezing.      aspirin EC 81 MG tablet Take 1 tablet (81 mg total) by mouth daily. Swallow whole. (Patient not taking: Reported on 07/08/2023) 30 tablet 12 Not Taking   furosemide (LASIX) 20 MG  tablet Take 20 mg by mouth. (Patient not taking: Reported on 07/08/2023)   Not Taking   potassium chloride (MICRO-K) 10 MEQ CR capsule Take 10 mEq by mouth daily. (Patient not taking: Reported on 07/08/2023)   Not Taking   silver sulfADIAZINE (SILVADENE) 1 % cream Apply 1 Application topically daily. Apply to left elbow area topically (Patient not taking: Reported on 07/08/2023)   Not Taking      Current Facility-Administered Medications:    Chlorhexidine Gluconate Cloth 2 % PADS 6 each, 6 each, Topical, Daily, Dgayli, Khabib, MD, 6 each at 08/07/23 0934   dextrose 10 % infusion, , Intravenous, Continuous, Rust-Chester, Cecelia Byars, NP, Last Rate: 20 mL/hr at 2023/08/11 0134, New Bag at 08-11-2023 0134   docusate (COLACE) 50 MG/5ML liquid 100 mg, 100 mg, Per Tube, BID, Rust-Chester, Micheline Rough L, NP   docusate sodium (COLACE) capsule 100 mg, 100 mg, Oral, BID PRN, Aundria Rud, Khabib, MD   enoxaparin (LOVENOX) injection 40 mg, 40 mg, Subcutaneous, Q24H, Rust-Chester, Britton L, NP, 40 mg at 08/07/23 0834   fentaNYL (SUBLIMAZE) injection 25 mcg, 25 mcg, Intravenous, Q15 min PRN, Rust-Chester, Micheline Rough L, NP   fentaNYL (SUBLIMAZE) injection 25-100 mcg, 25-100 mcg, Intravenous, Q30 min PRN, Rust-Chester, Britton L, NP   ipratropium-albuterol (DUONEB) 0.5-2.5 (3) MG/3ML nebulizer solution 3 mL, 3 mL, Nebulization, Q4H PRN, Rust-Chester, Micheline Rough L, NP   levETIRAcetam (KEPPRA) IVPB 500 mg/100 mL premix, 500 mg, Intravenous, Q12H, Rust-Chester, Britton L, NP, Last Rate: 400 mL/hr at 08-11-23 0138, 500 mg at Aug 11, 2023 0138   midazolam (VERSED) injection 1-2 mg, 1-2 mg,  Intravenous, Q1H PRN, Rust-Chester, Micheline Rough L, NP, 2 mg at 08/07/23 0352   norepinephrine (LEVOPHED) 4mg  in (0.016 mg/mL) premix infusion, 0-10 mcg/min, Intravenous, Continuous, Rust-Chester, Cecelia Byars, NP, Stopped at 08/05/2023 2033   Oral care mouth rinse, 15 mL, Mouth Rinse, Q2H, Rust-Chester, Britton L, NP, 15 mL at August 11, 2023 0400   Oral care mouth rinse, 15 mL, Mouth Rinse, PRN, Rust-Chester, Micheline Rough L, NP   pantoprazole (PROTONIX) injection 40 mg, 40 mg, Intravenous, QHS, Dgayli, Khabib, MD, 40 mg at 08/07/23 2204   piperacillin-tazobactam (ZOSYN) IVPB 3.375 g, 3.375 g, Intravenous, Q8H, Dgayli, Khabib, MD, Last Rate: 12.5 mL/hr at 2023-08-11 0413, 3.375 g at Aug 11, 2023 0413   polyethylene glycol (MIRALAX / GLYCOLAX) packet 17 g, 17 g, Oral, Daily PRN, Aundria Rud, Khabib, MD   polyethylene glycol (MIRALAX / GLYCOLAX) packet 17 g, 17 g, Per Tube, Daily, Rust-Chester, Micheline Rough L, NP   polyvinyl alcohol (LIQUIFILM TEARS) 1.4 % ophthalmic solution, , Both Eyes, Q4H PRN, Assaker, [place], MD, Given at 08/07/23 1715   propofol (DIPRIVAN) 1000 MG/100ML infusion, 5-50 mcg/kg/min, Intravenous, Continuous, Rust-Chester, Britton L, NP, Last Rate: 5.64 mL/hr at 08/11/2023 0000, 20 mcg/kg/min at 08/11/2023 0000  Vitals   Vitals:   08/07/23 2200 08/07/23 2300 08/11/2023 0000 11-Aug-2023 0100  BP: 127/68 133/66 122/60 111/64  Pulse: 73 72  64  Resp: 18 (!) 21 17 16   Temp: 98.2 F (36.8 C) 98.1 F (36.7 C) 97.9 F (36.6 C) 97.9 F (36.6 C)  TempSrc:   Esophageal   SpO2: 96% 98%  96%  Weight:      Height:         Body mass index is 19.76 kg/m.  Physical Exam   Physical Exam Gen: comatose in bed on ventilator Resp: ventilated CV: RRR  Neuro: *MS: No response to voice or noxious stimuli, does not follow commands *Speech: no attempts to speak *CN: PERRL, (+) corneals,  cough, gag; (-) oculocephalics, face symmetric at rest *Motor & sensory: no response to noxious stimuli BUE, minimal withdrawal to  noxious stimuli in BLE *Reflexes:  2+ and throughout without clonus, toes equiv bilat *Coordination, gait: UTA  Labs   CBC:  Recent Labs  Lab 08/11/2023 1713 08/07/23 0337  WBC 13.3* 15.8*  NEUTROABS  --  14.4*  HGB 11.5* 10.0*  HCT 38.8 31.6*  MCV 94.4 87.3  PLT 199 255    Basic Metabolic Panel:  Lab Results  Component Value Date   NA 135 08/07/2023   K 3.6 08/07/2023   CO2 29 08/07/2023   GLUCOSE 123 (H) 08/07/2023   BUN 25 (H) 08/07/2023   CREATININE 0.87 08/07/2023   CALCIUM 8.6 (L) 08/07/2023   GFRNONAA >60 08/07/2023   GFRAA 57 (L) 03/01/2019   Lipid Panel: No results found for: "LDLCALC" HgbA1c: No results found for: "HGBA1C" Urine Drug Screen: No results found for: "LABOPIA", "COCAINSCRNUR", "LABBENZ", "AMPHETMU", "THCU", "LABBARB"  Alcohol Level No results found for: "ETH"  CT Head without contrast: No acute findings on personal review  rEEG 10/24 Impression and clinical correlation: This EEG was obtained while comatose and sedated on propofol and is abnormal due to moderate to severe diffuse slowing indicative of global cerebral dysfunction, medication effect, or both.   Impression   This is an 88 year old woman with a past medical history significant for hypertension and osteoporosis who presents after an out-of-hospital cardiac arrest. She was weaned off sedation but sedation with propofol was restarted today 2/2 post-anoxic myoclonus. She was also started on keppra 500mg  bid. After sedation paused for 25 min she had the following findings on my exam: comatose, does not follow commands, pupils reactive, corneals, cough, gag intact, (-) oculocephalics, no visible myoclonus, no response BUE and slight withdrawal BLE to noxious stimuli. Her EEG showed mod-to-severe diffuse slowing while on sedation with propofol but was continuous. There were no myoclonic seizures or other epileptiform abnl while she was recorded under sedation.  Family states that she would not  want to be kept on life support if there was no hope of recovery to a meaningful quality of life. They are also afraid of rushing to make a decision too early. I recommended increasing keppra today and start to wean sedation tomorrow. Ideally I would be able to perform a neurologic exam on her after sedation had been off x24 hs. We also agreed to do a MRI brain 72 hrs post-arrest. With those additional pieces of information family will likely be able to make a decision that they feel honors patient's wishes and they feel comfortable with.  Recommendations   - Increase keppra to 1000mg  bid for myoclonus - Start to wean sedation tomorrow if able. Goal is to be off sedation x24 hrs for neuro exam - MRI brain wo at 72 hrs post arrest - Will continue to follow ______________________________________________________________________  This patient is critically ill and at significant risk of neurological worsening, death and care requires constant monitoring of vital signs, hemodynamics,respiratory and cardiac monitoring, neurological assessment, discussion with family, other specialists and medical decision making of high complexity. I spent 70 minutes of neurocritical care time  in the care of  this patient. This was time spent independent of any time provided by nurse practitioner or PA.  Bing Neighbors, MD [facility] [phone]  If 7pm- 7am, please page neurology on call as listed in AMION.

## 2023-08-15 NOTE — Progress Notes (Addendum)
PHARMACY CONSULT NOTE - ELECTROLYTES  Pharmacy Consult for Electrolyte Monitoring and Replacement   Recent Labs: Height: 5\' 3"  (160 cm) Weight: 51.3 kg (113 lb 1.5 oz) IBW/kg (Calculated) : 52.4 Estimated Creatinine Clearance: 37.6 mL/min (by C-G formula based on SCr of 0.87 mg/dL).  Potassium (mmol/L)  Date Value  07/30/2023 3.7   Magnesium (mg/dL)  Date Value  09/81/1914 2.1   Calcium (mg/dL)  Date Value  78/29/5621 8.6 (L)   Albumin (g/dL)  Date Value  30/86/5784 2.7 (L)   Phosphorus (mg/dL)  Date Value  69/62/9528 3.8   Sodium  Date Value  07/31/2023 137 mmol/L  07/04/2022 137   Corrected Ca: 9.64 mg/dL  Assessment  Morgan Sandoval is a 88 y.o. female presenting with cardiac arrest. PMH significant for gastric reflux, hypertension, possible pulmonary hypertension. Pharmacy has been consulted to monitor and replace electrolytes.  Diet: NPO MIVF: D10 @ 20 mL/hr   Goal of Therapy: Electrolytes WNL  Plan:  Electrolyte replacement not warranted at this time  Check BMP, Mg, Phos with AM labs  Thank you for allowing pharmacy to be a part of this patient's care.  Littie Deeds, PharmD Pharmacy Resident  08/04/2023 7:18 AM

## 2023-08-15 NOTE — IPAL (Signed)
  Interdisciplinary Goals of Care Family Meeting   Date carried out: 08/04/2023  Location of the meeting: Conference room  Member's involved: Physician, Bedside Registered Nurse, and Family Member or next of kin  Durable Power of Attorney or acting medical decision maker: Daughter Morgan Sandoval    Discussion:  I Had another discussion with her daughter and her extended family again today who emphasized that she wouldn't have wanted this if she knew about it and specially that the chances for a full recovery is minimal. They have agreed that the best option is to proceed with comfort measures.   Code status:   Code Status: Do not attempt resuscitation (DNR) - Comfort care   Disposition: In-patient comfort care  Time spent for the meeting: 20    Janann Colonel, MD  08/02/2023, 4:01 PM

## 2023-08-15 NOTE — IPAL (Signed)
  Interdisciplinary Goals of Care Family Meeting   Date carried out: 08/07/2023  Location of the meeting: Bedside  Member's involved: Physician and Family Member or next of kin  Durable Power of Attorney or acting medical decision maker: Her Daughter [place].    Discussion:  I Had another discussion with her daughter today and her brother who emphasized that she wouldn't have wanted this if she knew about it and specially that the chances for a full recovery is minimal. They have agreed that the best option is to proceed with comfort measures only acknowledging that this is a very difficult decision. However they would like to wait for their brother to arrive to have the final decision.   Code status:   Code Status: Limited: Do not attempt resuscitation (DNR) -DNR-LIMITED -Do Not Intubate/DNI    Disposition: Continue current acute care  Time spent for the meeting: 20 minutes   Janann Colonel, MD  08/12/2023, 3:11 PM

## 2023-08-15 NOTE — Progress Notes (Signed)
One way extubation completed per MD order °

## 2023-08-15 NOTE — Consult Note (Signed)
NEUROLOGY CONSULTATION NOTE   Date of service: 08/15/2023 Patient Name: Morgan Sandoval MRN:  [id number] DOB:  22-Jun-1935 Reason for consult: myoclonus, prognostication after cardiac arrest Requesting physician: Dr. Janann Colonel _ _ _   _ __   _ __ _ _  __ __   _ __   __ _  History of Present Illness   This is an 88 year old woman with a past medical history significant for hypertension and osteoporosis who presents after an out-of-hospital cardiac arrest.  She was in her normal state of health day of admission 08/03/2023.  She was arriving home from driving her car when she fell at the doorway becoming unresponsive and pulseless.  Is unclear how long she was unresponsive and pulseless before being found by a local mailman and receiving bystander CPR.  When EMS responded the initial rhythm was noted as asystole.  ROSC was achieved after 4 minutes of EMS CPR and ACLS.  At baseline patient is very panicked, lives alone, manages her own healthcare, independent in all ADLs.  She has been undergoing workup for suspected primary bronchogenic carcinoma with metastatic disease and recently decided to undergo a palliative radiation to control tumor growth and provide symptom relief.  More recently she has had progressive fatigue and requires 2 L nasal cannula O2 24 hours a day and ambulates with a walker.  Neurology is consulted for evaluation and management of postanoxic myoclonus as well as prognostication after cardiac arrest.  ROS   UTA 2/2 patient is comatose  Past History   I have reviewed the following:  Past Medical History:  Diagnosis Date   Actinic keratosis    Cholelithiasis    Chronic diarrhea    Colon polyp    Diverticulosis    GERD (gastroesophageal reflux disease)    if she eats acidic foods   Hypertension    Mucoid cyst of joint    left index finger   Osteoporosis    Past Surgical History:  Procedure Laterality Date   APPENDECTOMY     BIOPSY  05/19/2019    Procedure: BIOPSY;  Surgeon: Malissa Hippo, MD;  Location: [facility];  Service: Endoscopy;;  sigmoid colon   CHOLECYSTECTOMY     COLON SURGERY     Removed large polyps   COLONOSCOPY     COLONOSCOPY N/A 05/18/2015   Procedure: COLONOSCOPY;  Surgeon: Malissa Hippo, MD;  Location: [facility];  Service: Endoscopy;  Laterality: N/A;  1030   COLONOSCOPY N/A 05/19/2019   Procedure: COLONOSCOPY;  Surgeon: Malissa Hippo, MD;  Location: [facility];  Service: Endoscopy;  Laterality: N/A;  8:30   GANGLION CYST EXCISION Left 03/2019   the fore finger   IR VERTEBROPLASTY LUMBAR BX INC UNI/BIL INC/INJECT/IMAGING  07/27/2020   MASS EXCISION Left 03/02/2019   Procedure: EXCISION CYST, DEBRIDEMENT PROXIMAL INTERPHALANGEAL JOINT LEFT INDEX FINGER;  Surgeon: Cindee Salt, MD;  Location: [facility];  Service: Orthopedics;  Laterality: Left;   RECTAL SURGERY  2000s   [facility]   TONSILLECTOMY     Family History  Problem Relation Age of Onset   Cancer - Colon Brother    Social History   Socioeconomic History   Marital status: Widowed    Spouse name: Not on file   Number of children: Not on file   Years of education: Not on file   Highest education level: Not on file  Occupational History   Not on file  Tobacco Use  Smoking status: Former   Smokeless tobacco: Never  Vaping Use   Vaping status: Never Used  Substance and Sexual Activity   Alcohol use: Yes    Comment: occ   Drug use: No   Sexual activity: Not on file  Other Topics Concern   Not on file  Social History Narrative   Not on file   Social Determinants of Health   Financial Resource Strain: Low Risk  (05/13/2023)   Received from [facility]   Overall Financial Resource Strain (CARDIA)    Difficulty of Paying Living Expenses: Not hard at all  Food Insecurity: No Food Insecurity (07/08/2023)   Hunger Vital Sign    Worried About Running Out of Food in the Last Year: Never true     Ran Out of Food in the Last Year: Never true  Transportation Needs: Unknown (07/08/2023)   PRAPARE - Administrator, Civil Service (Medical): No    Lack of Transportation (Non-Medical): Not on file  Physical Activity: Not on file  Stress: Not on file  Social Connections: Not on file   Allergies  Allergen Reactions   Hydrochlorothiazide Other (See Comments)    Hyponatremia (10/2021)   Nifedipine Swelling    Leg swelling  Swelling of feet with redness and elevated BP   Sulfa Antibiotics Rash, Hives and Other (See Comments)    Medications   Medications Prior to Admission  Medication Sig Dispense Refill Last Dose   acetaminophen (TYLENOL) 325 MG tablet Take 650 mg by mouth every 4 (four) hours as needed.   prn   albuterol (VENTOLIN HFA) 108 (90 Base) MCG/ACT inhaler Inhale into the lungs.      alendronate (FOSAMAX) 70 MG tablet Take 70 mg by mouth every Sunday.   08/03/2023   azithromycin (ZITHROMAX) 250 MG tablet Take 250 mg by mouth 3 (three) times a week.   unk   carvedilol (COREG) 3.125 MG tablet Take 3.125 mg by mouth 2 (two) times daily.      cholecalciferol (VITAMIN D3) 25 MCG (1000 UNIT) tablet Take 1,000 Units by mouth daily.      Cyanocobalamin (B-12) 5000 MCG CAPS Take 5,000 mcg by mouth daily.      diclofenac sodium (VOLTAREN) 1 % GEL Apply 2 g topically 4 (four) times daily.   unk   hydroxychloroquine (PLAQUENIL) 200 MG tablet Take 200 mg by mouth daily.      ipratropium (ATROVENT) 0.06 % nasal spray Place into the nose.      lisinopril (ZESTRIL) 20 MG tablet Take 1 tablet (20 mg total) by mouth daily.   unk   melatonin 5 MG TABS Take 5 mg by mouth.      Multiple Vitamins-Minerals (PRESERVISION AREDS 2+MULTI VIT) CAPS Take 1 tablet by mouth daily.      mycophenolate (CELLCEPT) 500 MG tablet Take by mouth.      ondansetron (ZOFRAN) 4 MG tablet Take 4 mg by mouth every 8 (eight) hours as needed for nausea or vomiting.   Past Month   pantoprazole (PROTONIX) 40 MG  tablet Take 40 mg by mouth daily.      albuterol (VENTOLIN HFA) 108 (90 Base) MCG/ACT inhaler Inhale 2 puffs into the lungs every 6 (six) hours as needed for wheezing.      aspirin EC 81 MG tablet Take 1 tablet (81 mg total) by mouth daily. Swallow whole. (Patient not taking: Reported on 07/08/2023) 30 tablet 12 Not Taking   furosemide (LASIX) 20 MG  tablet Take 20 mg by mouth. (Patient not taking: Reported on 07/08/2023)   Not Taking   potassium chloride (MICRO-K) 10 MEQ CR capsule Take 10 mEq by mouth daily. (Patient not taking: Reported on 07/08/2023)   Not Taking   silver sulfADIAZINE (SILVADENE) 1 % cream Apply 1 Application topically daily. Apply to left elbow area topically (Patient not taking: Reported on 07/08/2023)   Not Taking      Current Facility-Administered Medications:    Chlorhexidine Gluconate Cloth 2 % PADS 6 each, 6 each, Topical, Daily, Dgayli, Khabib, MD, 6 each at 08/07/2023 0944   dextrose 10 % infusion, , Intravenous, Continuous, Harlon Ditty D, NP, Last Rate: 30 mL/hr at 08/07/2023 0809, Rate Change at 08/05/2023 0809   docusate (COLACE) 50 MG/5ML liquid 100 mg, 100 mg, Per Tube, BID, Rust-Chester, Micheline Rough L, NP   docusate sodium (COLACE) capsule 100 mg, 100 mg, Oral, BID PRN, Raechel Chute, MD   enoxaparin (LOVENOX) injection 40 mg, 40 mg, Subcutaneous, Q24H, Rust-Chester, Micheline Rough L, NP, 40 mg at 07/21/2023 0742   fentaNYL (SUBLIMAZE) injection 25 mcg, 25 mcg, Intravenous, Q15 min PRN, Rust-Chester, Micheline Rough L, NP   fentaNYL (SUBLIMAZE) injection 25-100 mcg, 25-100 mcg, Intravenous, Q30 min PRN, Rust-Chester, Britton L, NP   ipratropium-albuterol (DUONEB) 0.5-2.5 (3) MG/3ML nebulizer solution 3 mL, 3 mL, Nebulization, Q4H PRN, Rust-Chester, Micheline Rough L, NP   levETIRAcetam (KEPPRA) IVPB 1000 mg/100 mL premix, 1,000 mg, Intravenous, Q12H, Jefferson Fuel, MD, Last Rate: 400 mL/hr at 07/30/2023 1352, 1,000 mg at 07/24/2023 1352   midazolam (VERSED) injection 1-2 mg, 1-2 mg, Intravenous,  Q1H PRN, Rust-Chester, Cecelia Byars, NP, 2 mg at 08/07/23 0352   Oral care mouth rinse, 15 mL, Mouth Rinse, Q2H, Rust-Chester, Britton L, NP, 15 mL at 08/02/2023 1354   Oral care mouth rinse, 15 mL, Mouth Rinse, PRN, Rust-Chester, Micheline Rough L, NP   pantoprazole (PROTONIX) injection 40 mg, 40 mg, Intravenous, QHS, Dgayli, Khabib, MD, 40 mg at 08/07/23 2204   piperacillin-tazobactam (ZOSYN) IVPB 3.375 g, 3.375 g, Intravenous, Q8H, Dgayli, Khabib, MD, Last Rate: 12.5 mL/hr at 07/21/2023 1200, Infusion Verify at 08/02/2023 1200   polyethylene glycol (MIRALAX / GLYCOLAX) packet 17 g, 17 g, Oral, Daily PRN, Aundria Rud, Khabib, MD   polyethylene glycol (MIRALAX / GLYCOLAX) packet 17 g, 17 g, Per Tube, Daily, Rust-Chester, Micheline Rough L, NP   polyvinyl alcohol (LIQUIFILM TEARS) 1.4 % ophthalmic solution, , Both Eyes, Q4H PRN, Assaker, [place], MD, Given at 08/01/2023 1354   propofol (DIPRIVAN) 1000 MG/100ML infusion, 5-50 mcg/kg/min, Intravenous, Continuous, Rust-Chester, Cecelia Byars, NP, Stopped at 08/10/2023 0756  Vitals   Vitals:   08/10/2023 1000 07/15/2023 1100 08/11/2023 1200 07/21/2023 1300  BP: (!) 145/79 133/69 126/71 (!) 141/72  Pulse: 78 64    Resp: (!) 22 (!) 23 (!) 25 (!) 25  Temp: 97.9 F (36.6 C) 98.4 F (36.9 C) 98.2 F (36.8 C) 98.4 F (36.9 C)  TempSrc:      SpO2: 94% 94%    Weight:      Height:         Body mass index is 20.03 kg/m.  Physical Exam   Physical Exam Gen: comatose in bed on ventilator Resp: ventilated CV: RRR  Neuro: *MS: No response to voice or noxious stimuli, does not follow commands *Speech: no attempts to speak *CN: PERRL, (+) corneals, cough, gag; (-) oculocephalics, face symmetric at rest *Motor & sensory: no response to noxious stimuli BUE, minimal withdrawal to noxious stimuli in BLE *Reflexes:  2+ and throughout without clonus, toes equiv bilat *Coordination, gait: UTA  Labs   CBC:  Recent Labs  Lab 08/07/23 0337 07/26/2023 0845  WBC 15.8* 15.7*  NEUTROABS  14.4*  --   HGB 10.0* 11.2*  HCT 31.6* 35.0*  MCV 87.3 88.4  PLT 255 261    Basic Metabolic Panel:  Lab Results  Component Value Date   NA 137 07/19/2023   K 3.7 07/22/2023   CO2 28 07/20/2023   GLUCOSE 87 08/04/2023   BUN 21 08/14/2023   CREATININE 0.87 07/27/2023   CALCIUM 8.6 (L) 07/28/2023   GFRNONAA >60 07/24/2023   GFRAA 57 (L) 03/01/2019   Lipid Panel: No results found for: "LDLCALC" HgbA1c: No results found for: "HGBA1C" Urine Drug Screen: No results found for: "LABOPIA", "COCAINSCRNUR", "LABBENZ", "AMPHETMU", "THCU", "LABBARB"  Alcohol Level No results found for: "ETH"  CT Head without contrast: No acute findings on personal review  rEEG 10/24 Impression and clinical correlation: This EEG was obtained while comatose and sedated on propofol and is abnormal due to moderate to severe diffuse slowing indicative of global cerebral dysfunction, medication effect, or both.   Impression   This is an 88 year old woman with a past medical history significant for hypertension and osteoporosis who presents after an out-of-hospital cardiac arrest. She was weaned off sedation but sedation with propofol was restarted today 2/2 post-anoxic myoclonus. She was also started on keppra 500mg  bid. After sedation paused for 25 min she had the following findings on my exam: comatose, does not follow commands, pupils reactive, corneals, cough, gag intact, (-) oculocephalics, no visible myoclonus, no response BUE and slight withdrawal BLE to noxious stimuli. Her EEG showed mod-to-severe diffuse slowing while on sedation with propofol but was continuous. There were no myoclonic seizures or other epileptiform abnl while she was recorded under sedation.  Family states that she would not want to be kept on life support if there was no hope of recovery to a meaningful quality of life. They are also afraid of rushing to make a decision too early. I recommended increasing keppra today and start to wean  sedation tomorrow. Ideally I would be able to perform a neurologic exam on her after sedation had been off x24 hs. We also agreed to do a MRI brain 72 hrs post-arrest. With those additional pieces of information family will likely be able to make a decision that they feel honors patient's wishes and they feel comfortable with.  Recommendations   - Increase keppra to 1000mg  bid for myoclonus - Start to wean sedation tomorrow if able. Goal is to be off sedation x24 hrs for neuro exam - MRI brain wo at 72 hrs post arrest - Will continue to follow ______________________________________________________________________  This patient is critically ill and at significant risk of neurological worsening, death and care requires constant monitoring of vital signs, hemodynamics,respiratory and cardiac monitoring, neurological assessment, discussion with family, other specialists and medical decision making of high complexity. I spent 70 minutes of neurocritical care time  in the care of  this patient. This was time spent independent of any time provided by nurse practitioner or PA.  Bing Neighbors, MD [facility] [phone]  If 7pm- 7am, please page neurology on call as listed in AMION.

## 2023-08-15 NOTE — Progress Notes (Signed)
Pharmacy Antibiotic Note  Morgan Sandoval is a 88 y.o. female admitted on 08/06/2023 with  aspiration PNA . PMH significant for lung malignancy, pulmonary fibrosis, and COPD that presented for hypoxic PEA arrest requiring intubation and mechanical ventilation. Pharmacy has been consulted for Zosyn dosing.  Today, 07/19/2023 Day 2 of Zosyn  WBC 13.3 >> 15.7 Afebrile  Renal function at baseline with estimated CrCl of 37.6 mL/min   Plan: Continue Zosyn 3.375 gm IV Q8H  Pharmacy will continue to monitor and dose adjust appropriately   Height: 5\' 3"  (160 cm) Weight: 51.3 kg (113 lb 1.5 oz) IBW/kg (Calculated) : 52.4  Temp (24hrs), Avg:98.2 F (36.8 C), Min:97.3 F (36.3 C), Max:99 F (37.2 C)  Recent Labs  Lab 08/06/23 1713 08/07/23 0030 08/07/23 0337 08/03/2023 0548 08/02/2023 0845  WBC 13.3*  --  15.8*  --  15.7*  CREATININE 0.80  --  0.87 0.87  --   LATICACIDVEN  --  1.0 1.0  --   --     Estimated Creatinine Clearance: 37.6 mL/min (by C-G formula based on SCr of 0.87 mg/dL).    Allergies  Allergen Reactions   Hydrochlorothiazide Other (See Comments)    Hyponatremia (10/2021)   Nifedipine Swelling    Leg swelling  Swelling of feet with redness and elevated BP   Sulfa Antibiotics Rash, Hives and Other (See Comments)   Antimicrobials this admission: Zosyn 10/24 >>   Dose adjustments this admission: N/A  Microbiology results:   10/23 MRSA PCR: not detected  10/23 GI panel: negative  10/23 C.diff: negative  10/24 Resp panel: negative   Thank you for allowing pharmacy to be a part of this patient's care.  Littie Deeds, PharmD Pharmacy Resident  07/26/2023 12:04 PM

## 2023-08-15 NOTE — Progress Notes (Signed)
Neurology progress note  S: Patient remains comatose. Sedation with propofol was stopped at 0800 today. Myoclonus has resolved after increasing keppra to 1000mg  q 12 hrs.  O:  Vitals:   07/24/2023 1200 08/05/2023 1300  BP: 126/71 (!) 141/72  Pulse:    Resp: (!) 25 (!) 25  Temp: 98.2 F (36.8 C) 98.4 F (36.9 C)  SpO2:     Physical Exam Gen: comatose in bed on ventilator Resp: ventilated CV: RRR   Neuro: *MS: No response to voice or noxious stimuli, does not follow commands *Speech: no attempts to speak *CN: PERRL, (+) corneals, oculocephalics, cough, gag, face symmetric at rest *Motor & sensory: no response to noxious stimuli BUE, minimal withdrawal to noxious stimuli in BLE. Myoclonus has resolved. *Reflexes:  2+ and throughout without clonus, toes equiv bilat *Coordination, gait: UTA  CT Head without contrast No acute findings on personal review   rEEG 10/24 Impression and clinical correlation: This EEG was obtained while comatose and sedated on propofol and is abnormal due to moderate to severe diffuse slowing indicative of global cerebral dysfunction, medication effect, or both.   A/P: This is an 88 year old woman with a past medical history significant for hypertension and osteoporosis who presents after an out-of-hospital cardiac arrest on 08/06/23 PM. PEA arrest likely 2/2 hypoxic respiratory failure in the setting of COPD, lung cancer, and possibly community-acquired CAP. She has been off sedation since 0800 this AM 10/25. Her myoclonus resolved after increasing keppra to 1000mg  q 12 hrs. On exam she remains comatose, does not follow commands, pupils reactive, corneals, oculocephailcs, cough, gag intact, no visible myoclonus, no response BUE and slight withdrawal BLE to noxious stimuli. Her EEG showed mod-to-severe diffuse slowing while on sedation with propofol but was continuous. There were no myoclonic seizures or other epileptiform abnl while she was recorded under  sedation.   - Continue keppra 1000mg  q 12 hrs - Leave sedation off unless necessary to restart - MRI brain wo @ 72 hrs post arrest (tmrw 10/26 PM) - Will continue to follow  This patient is critically ill and at significant risk of neurological worsening, death and care requires constant monitoring of vital signs, hemodynamics,respiratory and cardiac monitoring, neurological assessment, discussion with family, other specialists and medical decision making of high complexity. I spent 45 minutes of neurocritical care time  in the care of  this patient. This was time spent independent of any time provided by nurse practitioner or PA.  Bing Neighbors, MD [facility] [phone]  If 7pm- 7am, please page neurology on call as listed in AMION.

## 2023-08-15 NOTE — Progress Notes (Signed)
   07/21/2023 1700  Spiritual Encounters  Type of Visit Initial  Care provided to: Pt and family  Conversation partners present during encounter Nurse  Referral source Nurse (RN/NT/LPN)  Reason for visit End-of-life  OnCall Visit No  Spiritual Framework  Presenting Themes Rituals and practive;Courage hope and growth   Chaplain received a page for EOL. Chaplain met with the family and talked and prayed with them. Chaplain stayed with the family while the patient was transitioning and sang and prayed with them. Family were grateful for the chaplains presence.

## 2023-08-15 NOTE — Progress Notes (Signed)
NAME:  Morgan Sandoval, MRN:  [id number], DOB:  1934-11-12, LOS: 2 ADMISSION DATE:  08/13/2023  History of Present Illness:  Unfortunate case of an 88 year old female patient with a recent history of primary lung malignancy, COPD and pulmonary fibrosis presenting to [facility] for hypoxic PEA arrest requiring intubation and mechanical ventilation.  Significant Hospital Events: Including procedures, antibiotic start and stop dates in addition to other pertinent events   Admitted overnight intubated.  Goals of care were discussed with family overnight and patient was made DNR.  Also noted to have myoclonic jerks overnight requiring sedation with Versed and propofol.  25-Aug-2023 - Remains with minimal responsiveness. Brainstem reflexes present.  Objective   Blood pressure (!) 141/72, pulse 64, temperature 98.4 F (36.9 C), resp. rate (!) 25, height 5\' 3"  (1.6 m), weight 51.3 kg, SpO2 94%.    Vent Mode: PRVC FiO2 (%):  [28 %-40 %] 30 % Set Rate:  [16 bmp] 16 bmp Vt Set:  [430 mL] 430 mL PEEP:  [5 cmH20] 5 cmH20 Plateau Pressure:  [22 cmH20-24 cmH20] 24 cmH20   Intake/Output Summary (Last 24 hours) at Aug 25, 2023 1506 Last data filed at 25-Aug-2023 1200 Gross per 24 hour  Intake 310.52 ml  Output 389 ml  Net -78.48 ml   Filed Weights   08/07/23 0000 08/07/23 0448 August 25, 2023 0458  Weight: 50.4 kg 50.6 kg 51.3 kg   Examination: General: Intubated, not responsive, off sedation HENT: Sluggish pupils Lungs: Coarse breath sounds bilaterally Cardiovascular: Normal S1 normal S2 regular rate and rhythm Abdomen: Soft positive bowel sounds Extremities: Warm well-perfused no edema  Labs and imaging were reviewed and  Assessment & Plan:  Unfortunate case of an 88 year old female patient with a recent history of primary lung malignancy, COPD and pulmonary fibrosis presenting to [facility] for hypoxic PEA arrest requiring intubation and mechanical ventilation.  # PEA arrest likely secondary to hypoxic  respiratory failure in the setting of lung cancer COPD and possible community-acquired pneumonia.  CT PE ruled out pulmonary emboli. # Shock likely distributive in setting of septic shock.  Possible source is pulmonary. # Hypoxic respiratory failure due to the above intubated mechanically ventilated. # Suspicion for primary lung neoplasm based on PET scan done as outpatient. # Myoclonic jerks due to hypoxic cardiac arrest  Neuro: Continue with propofol and Versed as needed for myoclonic jerks.  Fentanyl as needed for pain.  Continue with Keppra 500 mg twice daily. Cardiovascular: Continue with norepinephrine to maintain maps of greater than 65. Pulmonary: Continue mechanical ventilation for O2 sats 89 to 92%.  Zosyn for broad-spectrum antibiotics. Renal: Monitor urine output avoid nephrotoxic agents. Endo: POC's 1 40-1 80 Heme: Lovenox for DVT prophylaxis GI: PPI for stress ulcer prophylaxis  Best Practice (right click and "Reselect all SmartList Selections" daily)   Diet/type: NPO DVT prophylaxis: LMWH GI prophylaxis: PPI Lines: N/A Foley:  Yes, and it is still needed Code Status:  DNR Last date of multidisciplinary goals of care discussion [10/07/2023]  Had another discussion with her daughter today and her brother who emphasized that she wouldn't have wanted this if she knew about it and specially that the chances for a full recovery is minimal. They have agreed that the best option is to proceed with comfort measures only acknowledging that this is a very difficult decision. However they would like to wait for their brother to arrive to have the final decision.   I spent 40 minutes caring for this patient today, including preparing to see the  patient, obtaining a medical history , reviewing a separately obtained history, performing a medically appropriate examination and/or evaluation, counseling and educating the patient/family/caregiver, ordering medications, tests, or procedures,  documenting clinical information in the electronic health record, and independently interpreting results (not separately reported/billed) and communicating results to the patient/family/caregiver  Janann Colonel, MD [facility] 08-09-23 3:06 PM

## 2023-08-15 NOTE — Death Summary Note (Signed)
Interpretation: Date: 08/06/2023, EKG  Time: 17:08, Rate: 65, Rhythm: NSR, QRS Axis: Borderline RAD, Intervals: Normal, ST/T Wave abnormalities: None, Narrative Interpretation: NSR Chemistry: Na+: 132, K+: 4.7, BUN/Cr.: 19/0.80, Serum CO2/ AG: 24/15, albumin: 2.7 Hematology: WBC: 13.3, Hgb: 11.5,  Troponin: 14 > 109, Lactic/ PCT: pending, COVID-19 & Influenza A/B: pending   ABG: 7.39/ 54/ 84/ 32.7 CXR 08/06/2023: Increased bilateral lung opacities left greater than right concerning for worsening pneumonia with probable small left pleural effusion CT head without contrast 08/06/2023: No acute intracranial abnormality CT angio chest PE 08/06/2023: No pulmonary embolus.  Known left lower lobe mass measuring 6.3 x 5.5 cm recently assessed with PET CT. left pleural effusion has increased now moderate in size, trace right pleural effusion.  Sequela of ILD with basilar honeycombing.  Slight heterogeneous groundglass in RUL may represent pulmonary edema.  Shotty mediastinal and right hilar adenopathy progressed from prior exam.  New nondisplaced renal fracture.  Nondisplaced right anterior fourth rib fracture.  Chronic T7, T8 and L1 compression fractures CT abdomen pelvis with contrast 08/06/2023: Foley catheter is present in the vagina recommend repositioning.  Possible but not definite wall thickening of descending colon.  Generalized subcutaneous edema of the body wall.   PCCM consulted for admission due to out-of-hospital cardiac arrest requiring emergent intubation and mechanical ventilatory support with concern for anoxic injury.  Please see "Significant Hospital Events" section below for full detailed hospital course.   Significant Hospital Events  08/06/2023: Admit to ICU due to out-of-hospital cardiac arrest requiring emergent intubation and mechanical ventilatory support with concern for anoxic injury.  Possible aspiration, underlying primary metastatic lung cancer/ILD and COPD 08/07/2023: Admitted overnight intubated. Goals of care  were discussed with family overnight and patient was made DNR. Also noted to have myoclonic jerks overnight requiring sedation with Versed and propofol. EEG pending, Neurology consulted. 07/23/2023 - Remains with minimal responsiveness. Brainstem reflexes present. Family elected with transition to COMFORT MEASURES and withdraw care.  Pt expired shortly after care withdrawn.   Pertinent Labs and Studies  Significant Diagnostic Studies ECHOCARDIOGRAM COMPLETE  Result Date: 08/07/2023    ECHOCARDIOGRAM REPORT   Patient Name:   Morgan Sandoval Date of Exam: 08/07/2023 Medical Rec #:  [id number]         Height:       63.0 in Accession #:    [id number]        Weight:       111.6 lb Date of Birth:  September 21, 1935          BSA:          1.509 m Patient Age:    88 years          BP:           108/51 mmHg Patient Gender: F                 HR:           76 bpm. Exam Location:  [facility] Procedure: 2D Echo, Cardiac Doppler and Color Doppler Indications:     Cardiac Arrest  History:         Patient has no prior history of Echocardiogram examinations.                  Acute MI and Previous Myocardial Infarction, COPD,                  Signs/Symptoms:Edema and Syncope; Risk Factors:Hypertension and  DEATH SUMMARY   Patient Details  Name: Morgan Sandoval MRN: [id number] DOB: 12-May-1935  Admission/Discharge Information   Admit Date:  08-26-23  Date of Death:  08-28-23  Time of Death:  17:29  Length of Stay: 2  Referring Physician: Marisue Ivan, MD   Reason(s) for Hospitalization  Out-of-Hospital PEA Cardiac Arrest Acute Hypoxic Respiratory Failure Lung Cancer  AECOPD Community Acquired Pneumonia Septic Shock Myoclonic Jerking Concern for anoxic brain injury  Diagnoses  Preliminary cause of death:  Out-of-Hospital PEA Cardiac Arrest Secondary Diagnoses (including complications and co-morbidities):  Principal Problem:   Cardiac arrest (HCC) Active Problems:   Protein-calorie malnutrition, severe Acute Hypoxic Respiratory Failure Lung Cancer  AECOPD Community Acquired Pneumonia Septic Shock Myoclonic Jerking Concern for anoxic brain injury  Brief Hospital Course (including significant findings, care, treatment, and services provided and events leading to death)  Morgan Sandoval is a 88 y.o. year old female presenting to [facility] ED from home after an out of hospital cardiac arrest requiring emergent intubation in the field.   History provided per chart review and family bedside report as patient is unable to participate in interview at this time. Patient was in her normal state of health and had been out in her car earlier on 08-26-2023.  After arriving home it appears she had been attempting to enter her house when she fell at the doorway becoming unresponsive and pulseless.  It is unclear how long she was unresponsive and pulseless before being found by a local mailman and receiving bystander CPR.  EMS responded with the initial rhythm notated as asystole.  ROSC was achieved after 4 minutes of EMS CPR and ACLS.  Possible STEMI was called from the field by EMS.   Daughter describes the patient as fiercely independent, living alone managing her own health care  and independent of all ADLs.  She has been undergoing a workup for suspected primary bronchogenic carcinoma with metastatic disease, and recently decided to undergo palliative radiation therapy to control tumor growth and provide symptom relief.  Daughter reports primary complaint has been worsening fatigue and when the patient is utilizing portable oxygen feeling as though she is not getting enough air.  The patient is currently on 2 L nasal cannula 24 hours a day and ambulates with a walker.  Daughter denies any recent complaints of fever/chills, abdominal pain/nausea/vomiting, urinary symptoms, chest pain, new cough, falls, blurred vision, headache, or syncope.  Patient does have issues with chronic diarrhea which she controls with a strict diet.   ED course: Upon arrival cardiology reviewed EKG determining patient was not candidate for PCI.  Vital signs stable on mechanical ventilatory support.  Patient unresponsive with nonreactive pupils initially, propofol drip eventually started due to the patient biting down on the ETT and possible posturing.  EDP spoke with daughter who confirmed DNR/DNI status and was en route from [place].  Initial lab work largely reassuring with mild hyponatremia and hypochloremia, mild hyperglycemia and hypoalbuminemia & leukocytosis.  Chest x-ray concerning for pneumonia versus recurrent pleural effusion, CT head and abdomen pelvis unremarkable, CT angio negative for PE with small pericardial effusion noted, moderate recurrent left pleural effusion and trace right pleural effusion as well as groundglass in the right upper lobe suggestive of pulmonary edema versus aspiration. Medications given: NS bolus 500 mL, IV contrast, propofol drip started Initial Vitals: 97, 16, 72, 107/61 and 96% on 45% FiO2 Significant labs: (Labs/ Imaging personally reviewed) I, Cheryll Cockayne Rust-Chester, AGACNP-BC, personally viewed and interpreted this ECG. EKG  Interpretation: Date: 08/06/2023, EKG  Time: 17:08, Rate: 65, Rhythm: NSR, QRS Axis: Borderline RAD, Intervals: Normal, ST/T Wave abnormalities: None, Narrative Interpretation: NSR Chemistry: Na+: 132, K+: 4.7, BUN/Cr.: 19/0.80, Serum CO2/ AG: 24/15, albumin: 2.7 Hematology: WBC: 13.3, Hgb: 11.5,  Troponin: 14 > 109, Lactic/ PCT: pending, COVID-19 & Influenza A/B: pending   ABG: 7.39/ 54/ 84/ 32.7 CXR 08/06/2023: Increased bilateral lung opacities left greater than right concerning for worsening pneumonia with probable small left pleural effusion CT head without contrast 08/06/2023: No acute intracranial abnormality CT angio chest PE 08/06/2023: No pulmonary embolus.  Known left lower lobe mass measuring 6.3 x 5.5 cm recently assessed with PET CT. left pleural effusion has increased now moderate in size, trace right pleural effusion.  Sequela of ILD with basilar honeycombing.  Slight heterogeneous groundglass in RUL may represent pulmonary edema.  Shotty mediastinal and right hilar adenopathy progressed from prior exam.  New nondisplaced renal fracture.  Nondisplaced right anterior fourth rib fracture.  Chronic T7, T8 and L1 compression fractures CT abdomen pelvis with contrast 08/06/2023: Foley catheter is present in the vagina recommend repositioning.  Possible but not definite wall thickening of descending colon.  Generalized subcutaneous edema of the body wall.   PCCM consulted for admission due to out-of-hospital cardiac arrest requiring emergent intubation and mechanical ventilatory support with concern for anoxic injury.  Please see "Significant Hospital Events" section below for full detailed hospital course.   Significant Hospital Events  08/06/2023: Admit to ICU due to out-of-hospital cardiac arrest requiring emergent intubation and mechanical ventilatory support with concern for anoxic injury.  Possible aspiration, underlying primary metastatic lung cancer/ILD and COPD 08/07/2023: Admitted overnight intubated. Goals of care  were discussed with family overnight and patient was made DNR. Also noted to have myoclonic jerks overnight requiring sedation with Versed and propofol. EEG pending, Neurology consulted. 08/13/2023 - Remains with minimal responsiveness. Brainstem reflexes present. Family elected with transition to COMFORT MEASURES and withdraw care.  Pt expired shortly after care withdrawn.   Pertinent Labs and Studies  Significant Diagnostic Studies ECHOCARDIOGRAM COMPLETE  Result Date: 08/07/2023    ECHOCARDIOGRAM REPORT   Patient Name:   Morgan Sandoval Date of Exam: 08/07/2023 Medical Rec #:  [id number]         Height:       63.0 in Accession #:    [id number]        Weight:       111.6 lb Date of Birth:  September 21, 1935          BSA:          1.509 m Patient Age:    88 years          BP:           108/51 mmHg Patient Gender: F                 HR:           76 bpm. Exam Location:  [facility] Procedure: 2D Echo, Cardiac Doppler and Color Doppler Indications:     Cardiac Arrest  History:         Patient has no prior history of Echocardiogram examinations.                  Acute MI and Previous Myocardial Infarction, COPD,                  Signs/Symptoms:Edema and Syncope; Risk Factors:Hypertension and  Interpretation: Date: 08/06/2023, EKG  Time: 17:08, Rate: 65, Rhythm: NSR, QRS Axis: Borderline RAD, Intervals: Normal, ST/T Wave abnormalities: None, Narrative Interpretation: NSR Chemistry: Na+: 132, K+: 4.7, BUN/Cr.: 19/0.80, Serum CO2/ AG: 24/15, albumin: 2.7 Hematology: WBC: 13.3, Hgb: 11.5,  Troponin: 14 > 109, Lactic/ PCT: pending, COVID-19 & Influenza A/B: pending   ABG: 7.39/ 54/ 84/ 32.7 CXR 08/06/2023: Increased bilateral lung opacities left greater than right concerning for worsening pneumonia with probable small left pleural effusion CT head without contrast 08/06/2023: No acute intracranial abnormality CT angio chest PE 08/06/2023: No pulmonary embolus.  Known left lower lobe mass measuring 6.3 x 5.5 cm recently assessed with PET CT. left pleural effusion has increased now moderate in size, trace right pleural effusion.  Sequela of ILD with basilar honeycombing.  Slight heterogeneous groundglass in RUL may represent pulmonary edema.  Shotty mediastinal and right hilar adenopathy progressed from prior exam.  New nondisplaced renal fracture.  Nondisplaced right anterior fourth rib fracture.  Chronic T7, T8 and L1 compression fractures CT abdomen pelvis with contrast 08/06/2023: Foley catheter is present in the vagina recommend repositioning.  Possible but not definite wall thickening of descending colon.  Generalized subcutaneous edema of the body wall.   PCCM consulted for admission due to out-of-hospital cardiac arrest requiring emergent intubation and mechanical ventilatory support with concern for anoxic injury.  Please see "Significant Hospital Events" section below for full detailed hospital course.   Significant Hospital Events  08/06/2023: Admit to ICU due to out-of-hospital cardiac arrest requiring emergent intubation and mechanical ventilatory support with concern for anoxic injury.  Possible aspiration, underlying primary metastatic lung cancer/ILD and COPD 08/07/2023: Admitted overnight intubated. Goals of care  were discussed with family overnight and patient was made DNR. Also noted to have myoclonic jerks overnight requiring sedation with Versed and propofol. EEG pending, Neurology consulted. 07/18/2023 - Remains with minimal responsiveness. Brainstem reflexes present. Family elected with transition to COMFORT MEASURES and withdraw care.  Pt expired shortly after care withdrawn.   Pertinent Labs and Studies  Significant Diagnostic Studies ECHOCARDIOGRAM COMPLETE  Result Date: 08/07/2023    ECHOCARDIOGRAM REPORT   Patient Name:   Morgan Sandoval Date of Exam: 08/07/2023 Medical Rec #:  [id number]         Height:       63.0 in Accession #:    [id number]        Weight:       111.6 lb Date of Birth:  September 21, 1935          BSA:          1.509 m Patient Age:    88 years          BP:           108/51 mmHg Patient Gender: F                 HR:           76 bpm. Exam Location:  [facility] Procedure: 2D Echo, Cardiac Doppler and Color Doppler Indications:     Cardiac Arrest  History:         Patient has no prior history of Echocardiogram examinations.                  Acute MI and Previous Myocardial Infarction, COPD,                  Signs/Symptoms:Edema and Syncope; Risk Factors:Hypertension and  Interpretation: Date: 08/06/2023, EKG  Time: 17:08, Rate: 65, Rhythm: NSR, QRS Axis: Borderline RAD, Intervals: Normal, ST/T Wave abnormalities: None, Narrative Interpretation: NSR Chemistry: Na+: 132, K+: 4.7, BUN/Cr.: 19/0.80, Serum CO2/ AG: 24/15, albumin: 2.7 Hematology: WBC: 13.3, Hgb: 11.5,  Troponin: 14 > 109, Lactic/ PCT: pending, COVID-19 & Influenza A/B: pending   ABG: 7.39/ 54/ 84/ 32.7 CXR 08/06/2023: Increased bilateral lung opacities left greater than right concerning for worsening pneumonia with probable small left pleural effusion CT head without contrast 08/06/2023: No acute intracranial abnormality CT angio chest PE 08/06/2023: No pulmonary embolus.  Known left lower lobe mass measuring 6.3 x 5.5 cm recently assessed with PET CT. left pleural effusion has increased now moderate in size, trace right pleural effusion.  Sequela of ILD with basilar honeycombing.  Slight heterogeneous groundglass in RUL may represent pulmonary edema.  Shotty mediastinal and right hilar adenopathy progressed from prior exam.  New nondisplaced renal fracture.  Nondisplaced right anterior fourth rib fracture.  Chronic T7, T8 and L1 compression fractures CT abdomen pelvis with contrast 08/06/2023: Foley catheter is present in the vagina recommend repositioning.  Possible but not definite wall thickening of descending colon.  Generalized subcutaneous edema of the body wall.   PCCM consulted for admission due to out-of-hospital cardiac arrest requiring emergent intubation and mechanical ventilatory support with concern for anoxic injury.  Please see "Significant Hospital Events" section below for full detailed hospital course.   Significant Hospital Events  08/06/2023: Admit to ICU due to out-of-hospital cardiac arrest requiring emergent intubation and mechanical ventilatory support with concern for anoxic injury.  Possible aspiration, underlying primary metastatic lung cancer/ILD and COPD 08/07/2023: Admitted overnight intubated. Goals of care  were discussed with family overnight and patient was made DNR. Also noted to have myoclonic jerks overnight requiring sedation with Versed and propofol. EEG pending, Neurology consulted. 08/13/2023 - Remains with minimal responsiveness. Brainstem reflexes present. Family elected with transition to COMFORT MEASURES and withdraw care.  Pt expired shortly after care withdrawn.   Pertinent Labs and Studies  Significant Diagnostic Studies ECHOCARDIOGRAM COMPLETE  Result Date: 08/07/2023    ECHOCARDIOGRAM REPORT   Patient Name:   Morgan Sandoval Date of Exam: 08/07/2023 Medical Rec #:  [id number]         Height:       63.0 in Accession #:    [id number]        Weight:       111.6 lb Date of Birth:  September 21, 1935          BSA:          1.509 m Patient Age:    88 years          BP:           108/51 mmHg Patient Gender: F                 HR:           76 bpm. Exam Location:  [facility] Procedure: 2D Echo, Cardiac Doppler and Color Doppler Indications:     Cardiac Arrest  History:         Patient has no prior history of Echocardiogram examinations.                  Acute MI and Previous Myocardial Infarction, COPD,                  Signs/Symptoms:Edema and Syncope; Risk Factors:Hypertension and  Interpretation: Date: 08/06/2023, EKG  Time: 17:08, Rate: 65, Rhythm: NSR, QRS Axis: Borderline RAD, Intervals: Normal, ST/T Wave abnormalities: None, Narrative Interpretation: NSR Chemistry: Na+: 132, K+: 4.7, BUN/Cr.: 19/0.80, Serum CO2/ AG: 24/15, albumin: 2.7 Hematology: WBC: 13.3, Hgb: 11.5,  Troponin: 14 > 109, Lactic/ PCT: pending, COVID-19 & Influenza A/B: pending   ABG: 7.39/ 54/ 84/ 32.7 CXR 08/06/2023: Increased bilateral lung opacities left greater than right concerning for worsening pneumonia with probable small left pleural effusion CT head without contrast 08/06/2023: No acute intracranial abnormality CT angio chest PE 08/06/2023: No pulmonary embolus.  Known left lower lobe mass measuring 6.3 x 5.5 cm recently assessed with PET CT. left pleural effusion has increased now moderate in size, trace right pleural effusion.  Sequela of ILD with basilar honeycombing.  Slight heterogeneous groundglass in RUL may represent pulmonary edema.  Shotty mediastinal and right hilar adenopathy progressed from prior exam.  New nondisplaced renal fracture.  Nondisplaced right anterior fourth rib fracture.  Chronic T7, T8 and L1 compression fractures CT abdomen pelvis with contrast 08/06/2023: Foley catheter is present in the vagina recommend repositioning.  Possible but not definite wall thickening of descending colon.  Generalized subcutaneous edema of the body wall.   PCCM consulted for admission due to out-of-hospital cardiac arrest requiring emergent intubation and mechanical ventilatory support with concern for anoxic injury.  Please see "Significant Hospital Events" section below for full detailed hospital course.   Significant Hospital Events  08/06/2023: Admit to ICU due to out-of-hospital cardiac arrest requiring emergent intubation and mechanical ventilatory support with concern for anoxic injury.  Possible aspiration, underlying primary metastatic lung cancer/ILD and COPD 08/07/2023: Admitted overnight intubated. Goals of care  were discussed with family overnight and patient was made DNR. Also noted to have myoclonic jerks overnight requiring sedation with Versed and propofol. EEG pending, Neurology consulted. 07/31/2023 - Remains with minimal responsiveness. Brainstem reflexes present. Family elected with transition to COMFORT MEASURES and withdraw care.  Pt expired shortly after care withdrawn.   Pertinent Labs and Studies  Significant Diagnostic Studies ECHOCARDIOGRAM COMPLETE  Result Date: 08/07/2023    ECHOCARDIOGRAM REPORT   Patient Name:   Morgan Sandoval Date of Exam: 08/07/2023 Medical Rec #:  [id number]         Height:       63.0 in Accession #:    [id number]        Weight:       111.6 lb Date of Birth:  September 21, 1935          BSA:          1.509 m Patient Age:    88 years          BP:           108/51 mmHg Patient Gender: F                 HR:           76 bpm. Exam Location:  [facility] Procedure: 2D Echo, Cardiac Doppler and Color Doppler Indications:     Cardiac Arrest  History:         Patient has no prior history of Echocardiogram examinations.                  Acute MI and Previous Myocardial Infarction, COPD,                  Signs/Symptoms:Edema and Syncope; Risk Factors:Hypertension and  Interpretation: Date: 08/06/2023, EKG  Time: 17:08, Rate: 65, Rhythm: NSR, QRS Axis: Borderline RAD, Intervals: Normal, ST/T Wave abnormalities: None, Narrative Interpretation: NSR Chemistry: Na+: 132, K+: 4.7, BUN/Cr.: 19/0.80, Serum CO2/ AG: 24/15, albumin: 2.7 Hematology: WBC: 13.3, Hgb: 11.5,  Troponin: 14 > 109, Lactic/ PCT: pending, COVID-19 & Influenza A/B: pending   ABG: 7.39/ 54/ 84/ 32.7 CXR 08/06/2023: Increased bilateral lung opacities left greater than right concerning for worsening pneumonia with probable small left pleural effusion CT head without contrast 08/06/2023: No acute intracranial abnormality CT angio chest PE 08/06/2023: No pulmonary embolus.  Known left lower lobe mass measuring 6.3 x 5.5 cm recently assessed with PET CT. left pleural effusion has increased now moderate in size, trace right pleural effusion.  Sequela of ILD with basilar honeycombing.  Slight heterogeneous groundglass in RUL may represent pulmonary edema.  Shotty mediastinal and right hilar adenopathy progressed from prior exam.  New nondisplaced renal fracture.  Nondisplaced right anterior fourth rib fracture.  Chronic T7, T8 and L1 compression fractures CT abdomen pelvis with contrast 08/06/2023: Foley catheter is present in the vagina recommend repositioning.  Possible but not definite wall thickening of descending colon.  Generalized subcutaneous edema of the body wall.   PCCM consulted for admission due to out-of-hospital cardiac arrest requiring emergent intubation and mechanical ventilatory support with concern for anoxic injury.  Please see "Significant Hospital Events" section below for full detailed hospital course.   Significant Hospital Events  08/06/2023: Admit to ICU due to out-of-hospital cardiac arrest requiring emergent intubation and mechanical ventilatory support with concern for anoxic injury.  Possible aspiration, underlying primary metastatic lung cancer/ILD and COPD 08/07/2023: Admitted overnight intubated. Goals of care  were discussed with family overnight and patient was made DNR. Also noted to have myoclonic jerks overnight requiring sedation with Versed and propofol. EEG pending, Neurology consulted. 07/31/2023 - Remains with minimal responsiveness. Brainstem reflexes present. Family elected with transition to COMFORT MEASURES and withdraw care.  Pt expired shortly after care withdrawn.   Pertinent Labs and Studies  Significant Diagnostic Studies ECHOCARDIOGRAM COMPLETE  Result Date: 08/07/2023    ECHOCARDIOGRAM REPORT   Patient Name:   Morgan Sandoval Date of Exam: 08/07/2023 Medical Rec #:  [id number]         Height:       63.0 in Accession #:    [id number]        Weight:       111.6 lb Date of Birth:  September 21, 1935          BSA:          1.509 m Patient Age:    88 years          BP:           108/51 mmHg Patient Gender: F                 HR:           76 bpm. Exam Location:  [facility] Procedure: 2D Echo, Cardiac Doppler and Color Doppler Indications:     Cardiac Arrest  History:         Patient has no prior history of Echocardiogram examinations.                  Acute MI and Previous Myocardial Infarction, COPD,                  Signs/Symptoms:Edema and Syncope; Risk Factors:Hypertension and  DEATH SUMMARY   Patient Details  Name: Morgan Sandoval MRN: [id number] DOB: 12-May-1935  Admission/Discharge Information   Admit Date:  08-26-23  Date of Death:  08-28-23  Time of Death:  17:29  Length of Stay: 2  Referring Physician: Marisue Ivan, MD   Reason(s) for Hospitalization  Out-of-Hospital PEA Cardiac Arrest Acute Hypoxic Respiratory Failure Lung Cancer  AECOPD Community Acquired Pneumonia Septic Shock Myoclonic Jerking Concern for anoxic brain injury  Diagnoses  Preliminary cause of death:  Out-of-Hospital PEA Cardiac Arrest Secondary Diagnoses (including complications and co-morbidities):  Principal Problem:   Cardiac arrest (HCC) Active Problems:   Protein-calorie malnutrition, severe Acute Hypoxic Respiratory Failure Lung Cancer  AECOPD Community Acquired Pneumonia Septic Shock Myoclonic Jerking Concern for anoxic brain injury  Brief Hospital Course (including significant findings, care, treatment, and services provided and events leading to death)  Morgan Sandoval is a 88 y.o. year old female presenting to [facility] ED from home after an out of hospital cardiac arrest requiring emergent intubation in the field.   History provided per chart review and family bedside report as patient is unable to participate in interview at this time. Patient was in her normal state of health and had been out in her car earlier on 08-26-2023.  After arriving home it appears she had been attempting to enter her house when she fell at the doorway becoming unresponsive and pulseless.  It is unclear how long she was unresponsive and pulseless before being found by a local mailman and receiving bystander CPR.  EMS responded with the initial rhythm notated as asystole.  ROSC was achieved after 4 minutes of EMS CPR and ACLS.  Possible STEMI was called from the field by EMS.   Daughter describes the patient as fiercely independent, living alone managing her own health care  and independent of all ADLs.  She has been undergoing a workup for suspected primary bronchogenic carcinoma with metastatic disease, and recently decided to undergo palliative radiation therapy to control tumor growth and provide symptom relief.  Daughter reports primary complaint has been worsening fatigue and when the patient is utilizing portable oxygen feeling as though she is not getting enough air.  The patient is currently on 2 L nasal cannula 24 hours a day and ambulates with a walker.  Daughter denies any recent complaints of fever/chills, abdominal pain/nausea/vomiting, urinary symptoms, chest pain, new cough, falls, blurred vision, headache, or syncope.  Patient does have issues with chronic diarrhea which she controls with a strict diet.   ED course: Upon arrival cardiology reviewed EKG determining patient was not candidate for PCI.  Vital signs stable on mechanical ventilatory support.  Patient unresponsive with nonreactive pupils initially, propofol drip eventually started due to the patient biting down on the ETT and possible posturing.  EDP spoke with daughter who confirmed DNR/DNI status and was en route from [place].  Initial lab work largely reassuring with mild hyponatremia and hypochloremia, mild hyperglycemia and hypoalbuminemia & leukocytosis.  Chest x-ray concerning for pneumonia versus recurrent pleural effusion, CT head and abdomen pelvis unremarkable, CT angio negative for PE with small pericardial effusion noted, moderate recurrent left pleural effusion and trace right pleural effusion as well as groundglass in the right upper lobe suggestive of pulmonary edema versus aspiration. Medications given: NS bolus 500 mL, IV contrast, propofol drip started Initial Vitals: 97, 16, 72, 107/61 and 96% on 45% FiO2 Significant labs: (Labs/ Imaging personally reviewed) I, Cheryll Cockayne Rust-Chester, AGACNP-BC, personally viewed and interpreted this ECG. EKG  Interpretation: Date: 08/06/2023, EKG  Time: 17:08, Rate: 65, Rhythm: NSR, QRS Axis: Borderline RAD, Intervals: Normal, ST/T Wave abnormalities: None, Narrative Interpretation: NSR Chemistry: Na+: 132, K+: 4.7, BUN/Cr.: 19/0.80, Serum CO2/ AG: 24/15, albumin: 2.7 Hematology: WBC: 13.3, Hgb: 11.5,  Troponin: 14 > 109, Lactic/ PCT: pending, COVID-19 & Influenza A/B: pending   ABG: 7.39/ 54/ 84/ 32.7 CXR 08/06/2023: Increased bilateral lung opacities left greater than right concerning for worsening pneumonia with probable small left pleural effusion CT head without contrast 08/06/2023: No acute intracranial abnormality CT angio chest PE 08/06/2023: No pulmonary embolus.  Known left lower lobe mass measuring 6.3 x 5.5 cm recently assessed with PET CT. left pleural effusion has increased now moderate in size, trace right pleural effusion.  Sequela of ILD with basilar honeycombing.  Slight heterogeneous groundglass in RUL may represent pulmonary edema.  Shotty mediastinal and right hilar adenopathy progressed from prior exam.  New nondisplaced renal fracture.  Nondisplaced right anterior fourth rib fracture.  Chronic T7, T8 and L1 compression fractures CT abdomen pelvis with contrast 08/06/2023: Foley catheter is present in the vagina recommend repositioning.  Possible but not definite wall thickening of descending colon.  Generalized subcutaneous edema of the body wall.   PCCM consulted for admission due to out-of-hospital cardiac arrest requiring emergent intubation and mechanical ventilatory support with concern for anoxic injury.  Please see "Significant Hospital Events" section below for full detailed hospital course.   Significant Hospital Events  08/06/2023: Admit to ICU due to out-of-hospital cardiac arrest requiring emergent intubation and mechanical ventilatory support with concern for anoxic injury.  Possible aspiration, underlying primary metastatic lung cancer/ILD and COPD 08/07/2023: Admitted overnight intubated. Goals of care  were discussed with family overnight and patient was made DNR. Also noted to have myoclonic jerks overnight requiring sedation with Versed and propofol. EEG pending, Neurology consulted. 07/27/2023 - Remains with minimal responsiveness. Brainstem reflexes present. Family elected with transition to COMFORT MEASURES and withdraw care.  Pt expired shortly after care withdrawn.   Pertinent Labs and Studies  Significant Diagnostic Studies ECHOCARDIOGRAM COMPLETE  Result Date: 08/07/2023    ECHOCARDIOGRAM REPORT   Patient Name:   Morgan Sandoval Date of Exam: 08/07/2023 Medical Rec #:  [id number]         Height:       63.0 in Accession #:    [id number]        Weight:       111.6 lb Date of Birth:  September 21, 1935          BSA:          1.509 m Patient Age:    88 years          BP:           108/51 mmHg Patient Gender: F                 HR:           76 bpm. Exam Location:  [facility] Procedure: 2D Echo, Cardiac Doppler and Color Doppler Indications:     Cardiac Arrest  History:         Patient has no prior history of Echocardiogram examinations.                  Acute MI and Previous Myocardial Infarction, COPD,                  Signs/Symptoms:Edema and Syncope; Risk Factors:Hypertension and  Interpretation: Date: 08/06/2023, EKG  Time: 17:08, Rate: 65, Rhythm: NSR, QRS Axis: Borderline RAD, Intervals: Normal, ST/T Wave abnormalities: None, Narrative Interpretation: NSR Chemistry: Na+: 132, K+: 4.7, BUN/Cr.: 19/0.80, Serum CO2/ AG: 24/15, albumin: 2.7 Hematology: WBC: 13.3, Hgb: 11.5,  Troponin: 14 > 109, Lactic/ PCT: pending, COVID-19 & Influenza A/B: pending   ABG: 7.39/ 54/ 84/ 32.7 CXR 08/06/2023: Increased bilateral lung opacities left greater than right concerning for worsening pneumonia with probable small left pleural effusion CT head without contrast 08/06/2023: No acute intracranial abnormality CT angio chest PE 08/06/2023: No pulmonary embolus.  Known left lower lobe mass measuring 6.3 x 5.5 cm recently assessed with PET CT. left pleural effusion has increased now moderate in size, trace right pleural effusion.  Sequela of ILD with basilar honeycombing.  Slight heterogeneous groundglass in RUL may represent pulmonary edema.  Shotty mediastinal and right hilar adenopathy progressed from prior exam.  New nondisplaced renal fracture.  Nondisplaced right anterior fourth rib fracture.  Chronic T7, T8 and L1 compression fractures CT abdomen pelvis with contrast 08/06/2023: Foley catheter is present in the vagina recommend repositioning.  Possible but not definite wall thickening of descending colon.  Generalized subcutaneous edema of the body wall.   PCCM consulted for admission due to out-of-hospital cardiac arrest requiring emergent intubation and mechanical ventilatory support with concern for anoxic injury.  Please see "Significant Hospital Events" section below for full detailed hospital course.   Significant Hospital Events  08/06/2023: Admit to ICU due to out-of-hospital cardiac arrest requiring emergent intubation and mechanical ventilatory support with concern for anoxic injury.  Possible aspiration, underlying primary metastatic lung cancer/ILD and COPD 08/07/2023: Admitted overnight intubated. Goals of care  were discussed with family overnight and patient was made DNR. Also noted to have myoclonic jerks overnight requiring sedation with Versed and propofol. EEG pending, Neurology consulted. 08/12/2023 - Remains with minimal responsiveness. Brainstem reflexes present. Family elected with transition to COMFORT MEASURES and withdraw care.  Pt expired shortly after care withdrawn.   Pertinent Labs and Studies  Significant Diagnostic Studies ECHOCARDIOGRAM COMPLETE  Result Date: 08/07/2023    ECHOCARDIOGRAM REPORT   Patient Name:   Morgan Sandoval Date of Exam: 08/07/2023 Medical Rec #:  [id number]         Height:       63.0 in Accession #:    [id number]        Weight:       111.6 lb Date of Birth:  September 21, 1935          BSA:          1.509 m Patient Age:    88 years          BP:           108/51 mmHg Patient Gender: F                 HR:           76 bpm. Exam Location:  [facility] Procedure: 2D Echo, Cardiac Doppler and Color Doppler Indications:     Cardiac Arrest  History:         Patient has no prior history of Echocardiogram examinations.                  Acute MI and Previous Myocardial Infarction, COPD,                  Signs/Symptoms:Edema and Syncope; Risk Factors:Hypertension and

## 2023-08-15 DEATH — deceased

## 2023-08-18 ENCOUNTER — Ambulatory Visit: Payer: Medicare Other

## 2023-08-19 ENCOUNTER — Ambulatory Visit: Payer: Medicare Other

## 2023-08-20 ENCOUNTER — Ambulatory Visit: Payer: Medicare Other

## 2023-08-21 ENCOUNTER — Ambulatory Visit: Payer: Medicare Other

## 2023-08-22 ENCOUNTER — Ambulatory Visit: Payer: Medicare Other

## 2023-08-22 LAB — FUNGUS CULTURE WITH STAIN

## 2023-08-22 LAB — FUNGUS CULTURE RESULT

## 2023-08-22 LAB — FUNGAL ORGANISM REFLEX

## 2023-08-25 ENCOUNTER — Ambulatory Visit: Payer: Medicare Other

## 2023-08-26 ENCOUNTER — Ambulatory Visit: Payer: Medicare Other

## 2023-08-27 ENCOUNTER — Ambulatory Visit: Payer: Medicare Other

## 2023-08-28 ENCOUNTER — Ambulatory Visit: Payer: Medicare Other

## 2023-08-29 ENCOUNTER — Ambulatory Visit: Payer: Medicare Other

## 2023-09-01 ENCOUNTER — Ambulatory Visit: Payer: Medicare Other

## 2023-09-02 ENCOUNTER — Ambulatory Visit: Payer: Medicare Other

## 2023-09-02 ENCOUNTER — Ambulatory Visit: Payer: Medicare Other | Admitting: Internal Medicine

## 2023-09-03 ENCOUNTER — Ambulatory Visit: Payer: Medicare Other

## 2023-09-04 ENCOUNTER — Ambulatory Visit: Payer: Medicare Other

## 2023-09-06 LAB — ACID FAST CULTURE WITH REFLEXED SENSITIVITIES (MYCOBACTERIA): Acid Fast Culture: NEGATIVE

## 2023-09-08 ENCOUNTER — Ambulatory Visit: Payer: Medicare Other

## 2023-09-09 ENCOUNTER — Ambulatory Visit: Payer: Medicare Other

## 2023-09-10 ENCOUNTER — Ambulatory Visit: Payer: Medicare Other

## 2023-09-15 ENCOUNTER — Ambulatory Visit: Payer: Medicare Other

## 2023-09-16 ENCOUNTER — Ambulatory Visit: Payer: Medicare Other

## 2023-09-17 ENCOUNTER — Ambulatory Visit: Payer: Medicare Other

## 2023-12-10 ENCOUNTER — Ambulatory Visit: Payer: Medicare Other | Admitting: Dermatology
